# Patient Record
Sex: Male | Born: 1980
Health system: Southern US, Community
[De-identification: ages and names within clinical notes are randomized; demographics above are authoritative.]

## PROBLEM LIST (undated history)

## (undated) DIAGNOSIS — E119 Type 2 diabetes mellitus without complications: Secondary | ICD-10-CM

## (undated) DIAGNOSIS — D571 Sickle-cell disease without crisis: Secondary | ICD-10-CM

## (undated) HISTORY — PX: CHOLECYSTECTOMY, LAPAROSCOPIC: SHX56

---

## 2006-11-06 ENCOUNTER — Inpatient Hospital Stay (HOSPITAL_COMMUNITY): Admission: EM | Admit: 2006-11-06 | Discharge: 2006-11-11 | Payer: Self-pay | Admitting: Emergency Medicine

## 2006-11-06 ENCOUNTER — Ambulatory Visit: Payer: Self-pay | Admitting: Cardiology

## 2006-11-06 ENCOUNTER — Ambulatory Visit: Payer: Self-pay | Admitting: Internal Medicine

## 2006-11-06 IMAGING — CT CT ANGIO CHEST
2 of 5 series · 18 of 36 positions shown · IV contrast (APPLIED)
Comparison: None.

CLINICAL DATA: Chest pain. Shortness of breath. Elevated d-dimer. Leukocytosis.
History of sickle cell disease.

CT ANGIOGRAPHY OF CHEST ( PULMONARY EMBOLISM PROTOCOL)  [DATE]:
TECHNIQUE: Multidetector CT imaging of the chest was performed according to the
protocol for detection of pulmonary embolism during bolus injection of
intravenous contrast.  Coronal and sagittal plane CT angiographic image
reconstructions were also generated.
Contrast:  100 cc Omnipaque 300

[Series 5: pe 1.0 b40f thins for pacs · axial · 0.67mm/px · z∈[+1118,+1362]mm · 15 of 280 slices shown]
[im 18/280  lung]
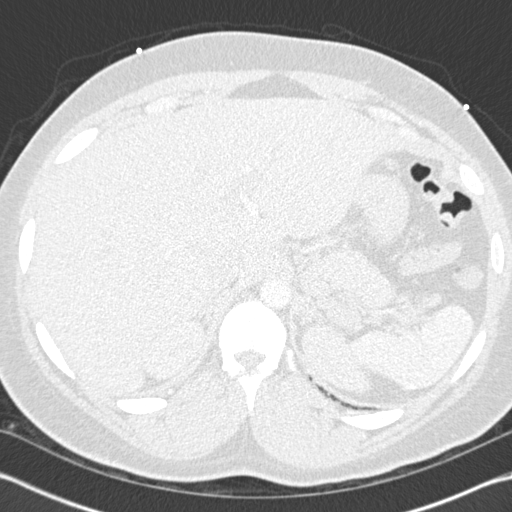
[im 35/280  mediastinal]
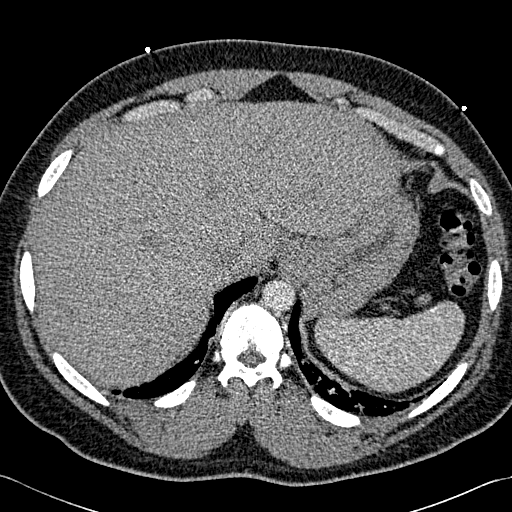
[im 53/280  lung]
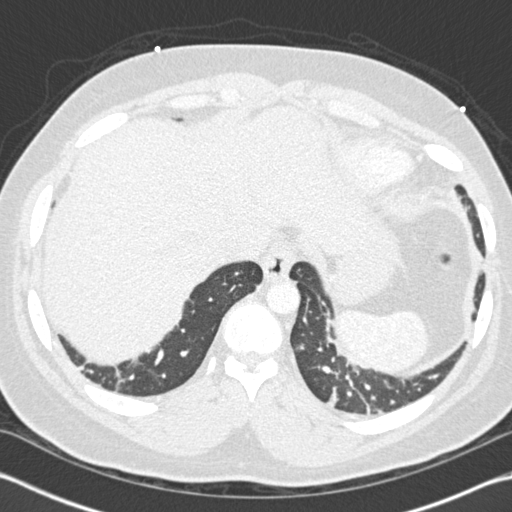
[im 70/280  mediastinal]
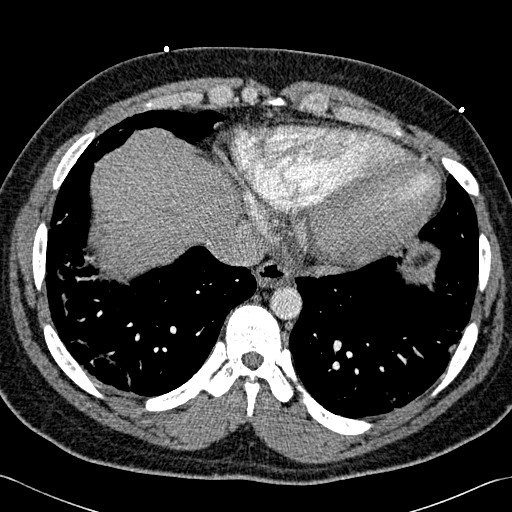
[im 88/280  lung]
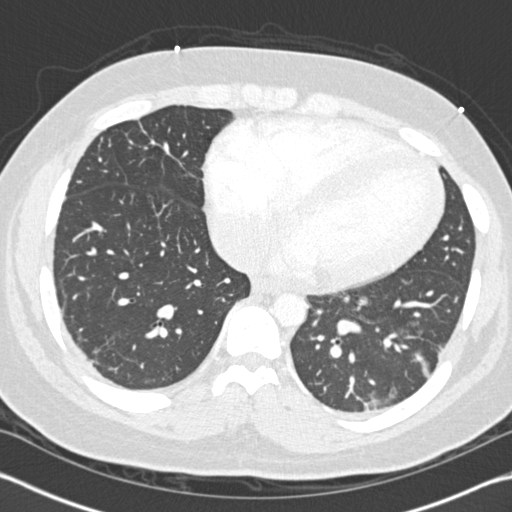
[im 105/280  mediastinal]
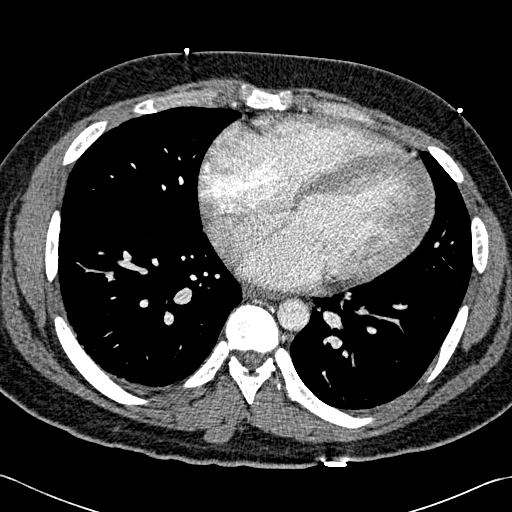
[im 123/280  lung]
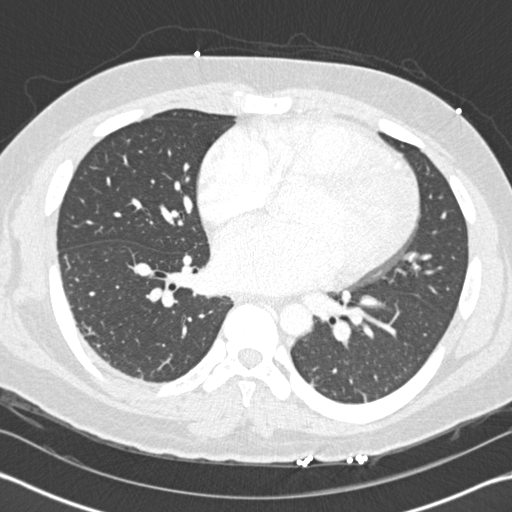
[im 140/280  mediastinal]
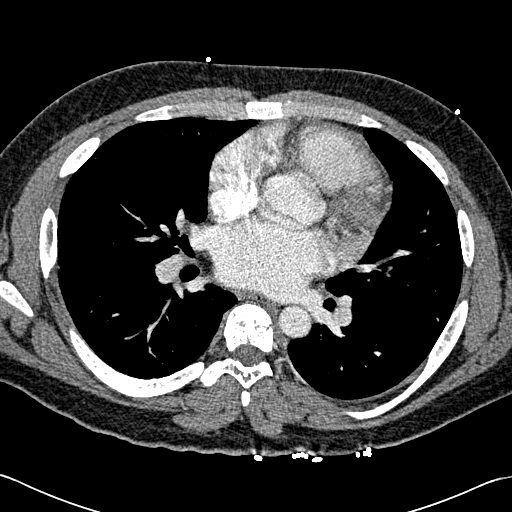
[im 157/280  lung]
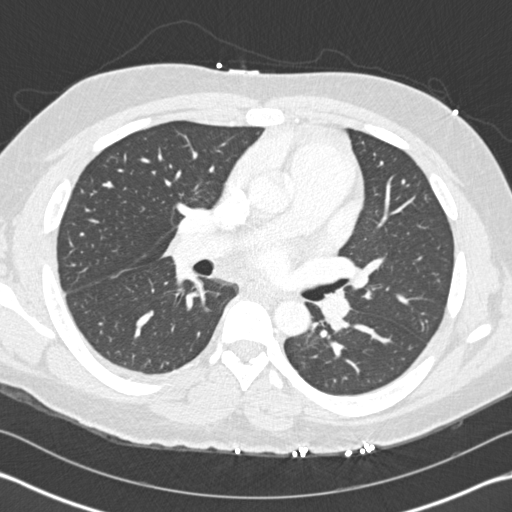
[im 175/280  mediastinal]
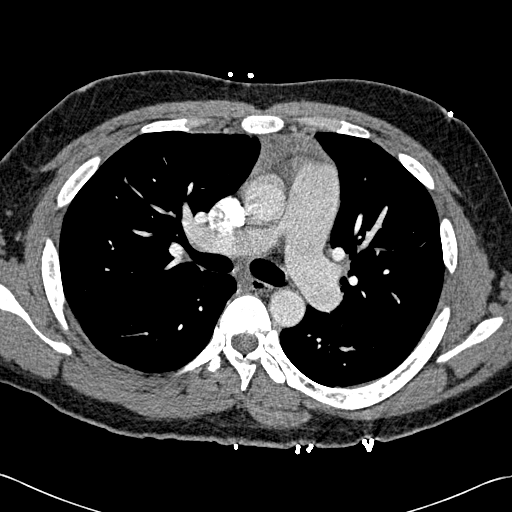
[im 192/280  lung]
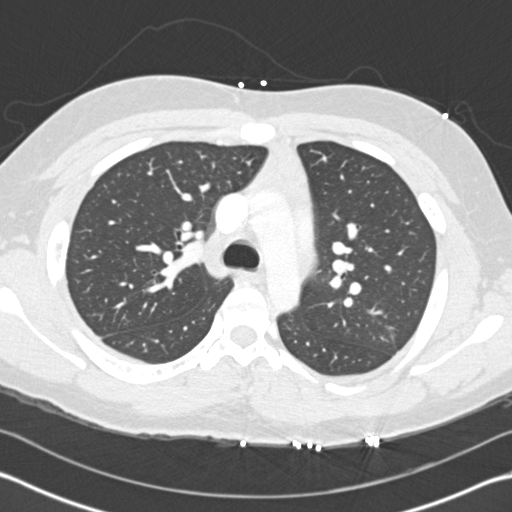
[im 210/280  mediastinal]
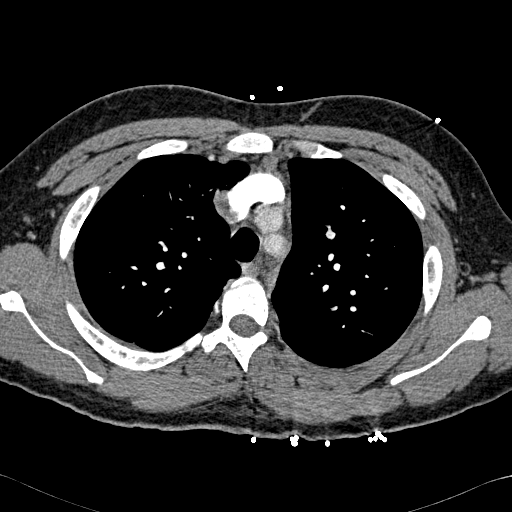
[im 227/280  lung]
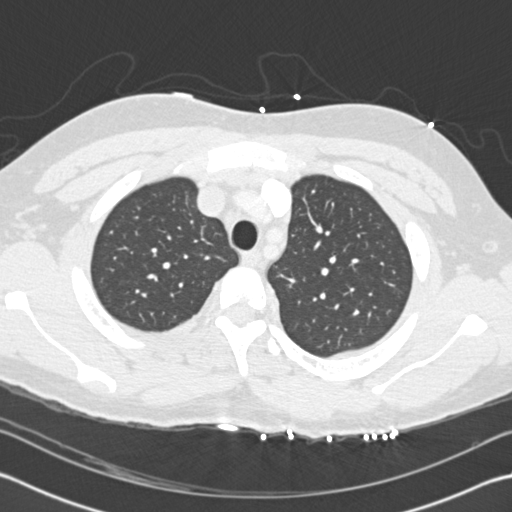
[im 245/280  mediastinal]
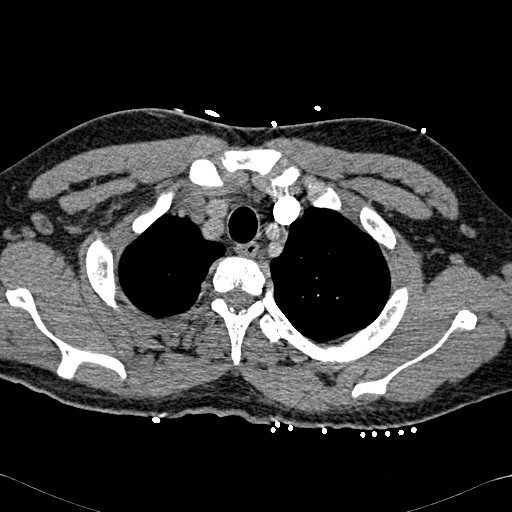
[im 262/280  lung]
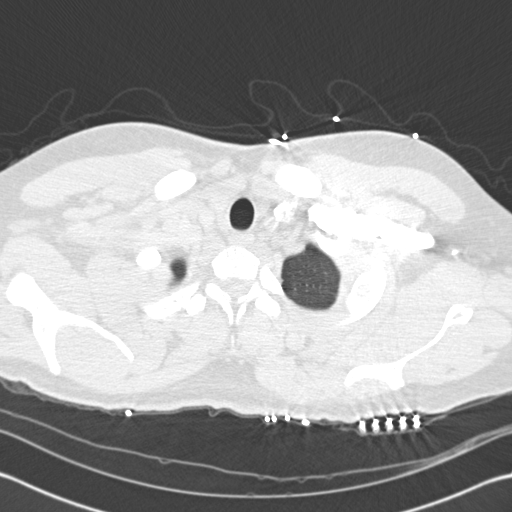

[Series 602: <mpr range> · coronal · 0.67mm/px · 3 of 69 slices shown]
[im 14/69  mediastinal]
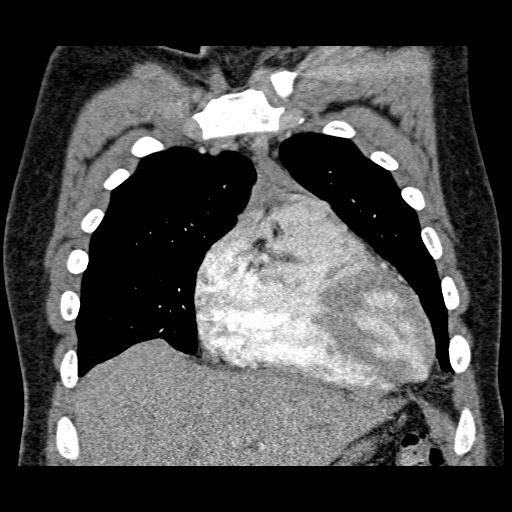
[im 28/69  mediastinal]
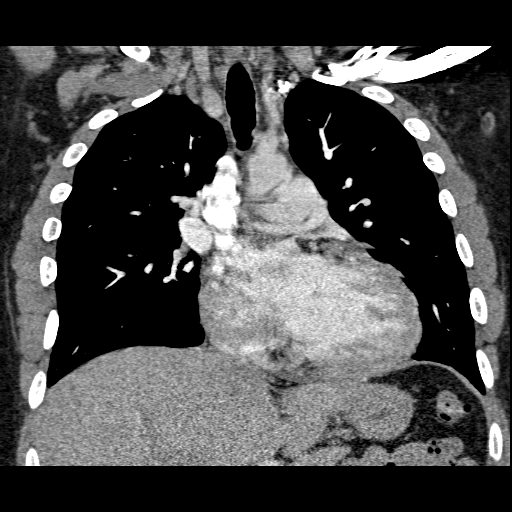
[im 41/69  mediastinal]
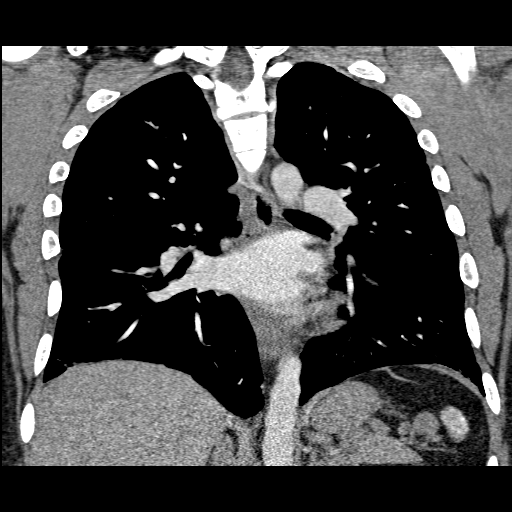

[18 of 36 positions shown; findings below may reference images not displayed]

FINDINGS: Contrast opacification of the pulmonary arteries is only fair,
yielding a study of moderate diagnostic quality. No filling defect either main
pulmonary artery or their central branches in either lung to suggest pulmonary
embolism. Heart enlarged with left ventricular predominance. No visible coronary
artery calcification. Thoracic and upper abdominal aorta normal in appearance.
No pericardial effusion. Residual thymic tissue in the anterior/superior
mediastinum. No significant lymphadenopathy.

Linear scar and/or atelectasis in the lower lobes and right middle lobe. Lungs
otherwise clear. No localized airspace consolidation. No pleural effusions. No
new interstitial disease to suggest edema.

Images of the upper abdomen demonstrating lymphadenopathy in the region of the
splenic hilum and adjacent to the tail of the pancreas. Small hiatal hernia.
Remaining visualized upper abdomen unremarkable.
IMPRESSION: 1. No evidence of central pulmonary embolism.
2. Cardiomegaly. Linear atelectasis and/or scarring in the lower lobes and right
middle lobe. No acute cardiopulmonary disease otherwise. The opacity at the
right base on the the chest x-ray earlier is in fact more consistent with
atelectasis than pneumonia.
3. Lymphadenopathy in the region of the splenic hilum and adjacent to the
pancreatic tail of unclear etiology. 

CT of the abdomen and pelvis may be helpful at some point for further evaluation
to see if there is lymphadenopathy elsewhere. With a history of sickle cell
disease, I would suggest waiting at least one day before giving the patient
another intravenous contrast load in order to minimize the chance of renal
damage.

## 2006-11-06 IMAGING — CR DG CHEST 2V
2 series · 2 of 2 positions shown · non-contrast
Comparison: None.

CLINICAL DATA: Chest pain. Shortness of breath. History of sickle cell disease.

CHEST - 2 VIEW  [DATE]:

[w chest lat]
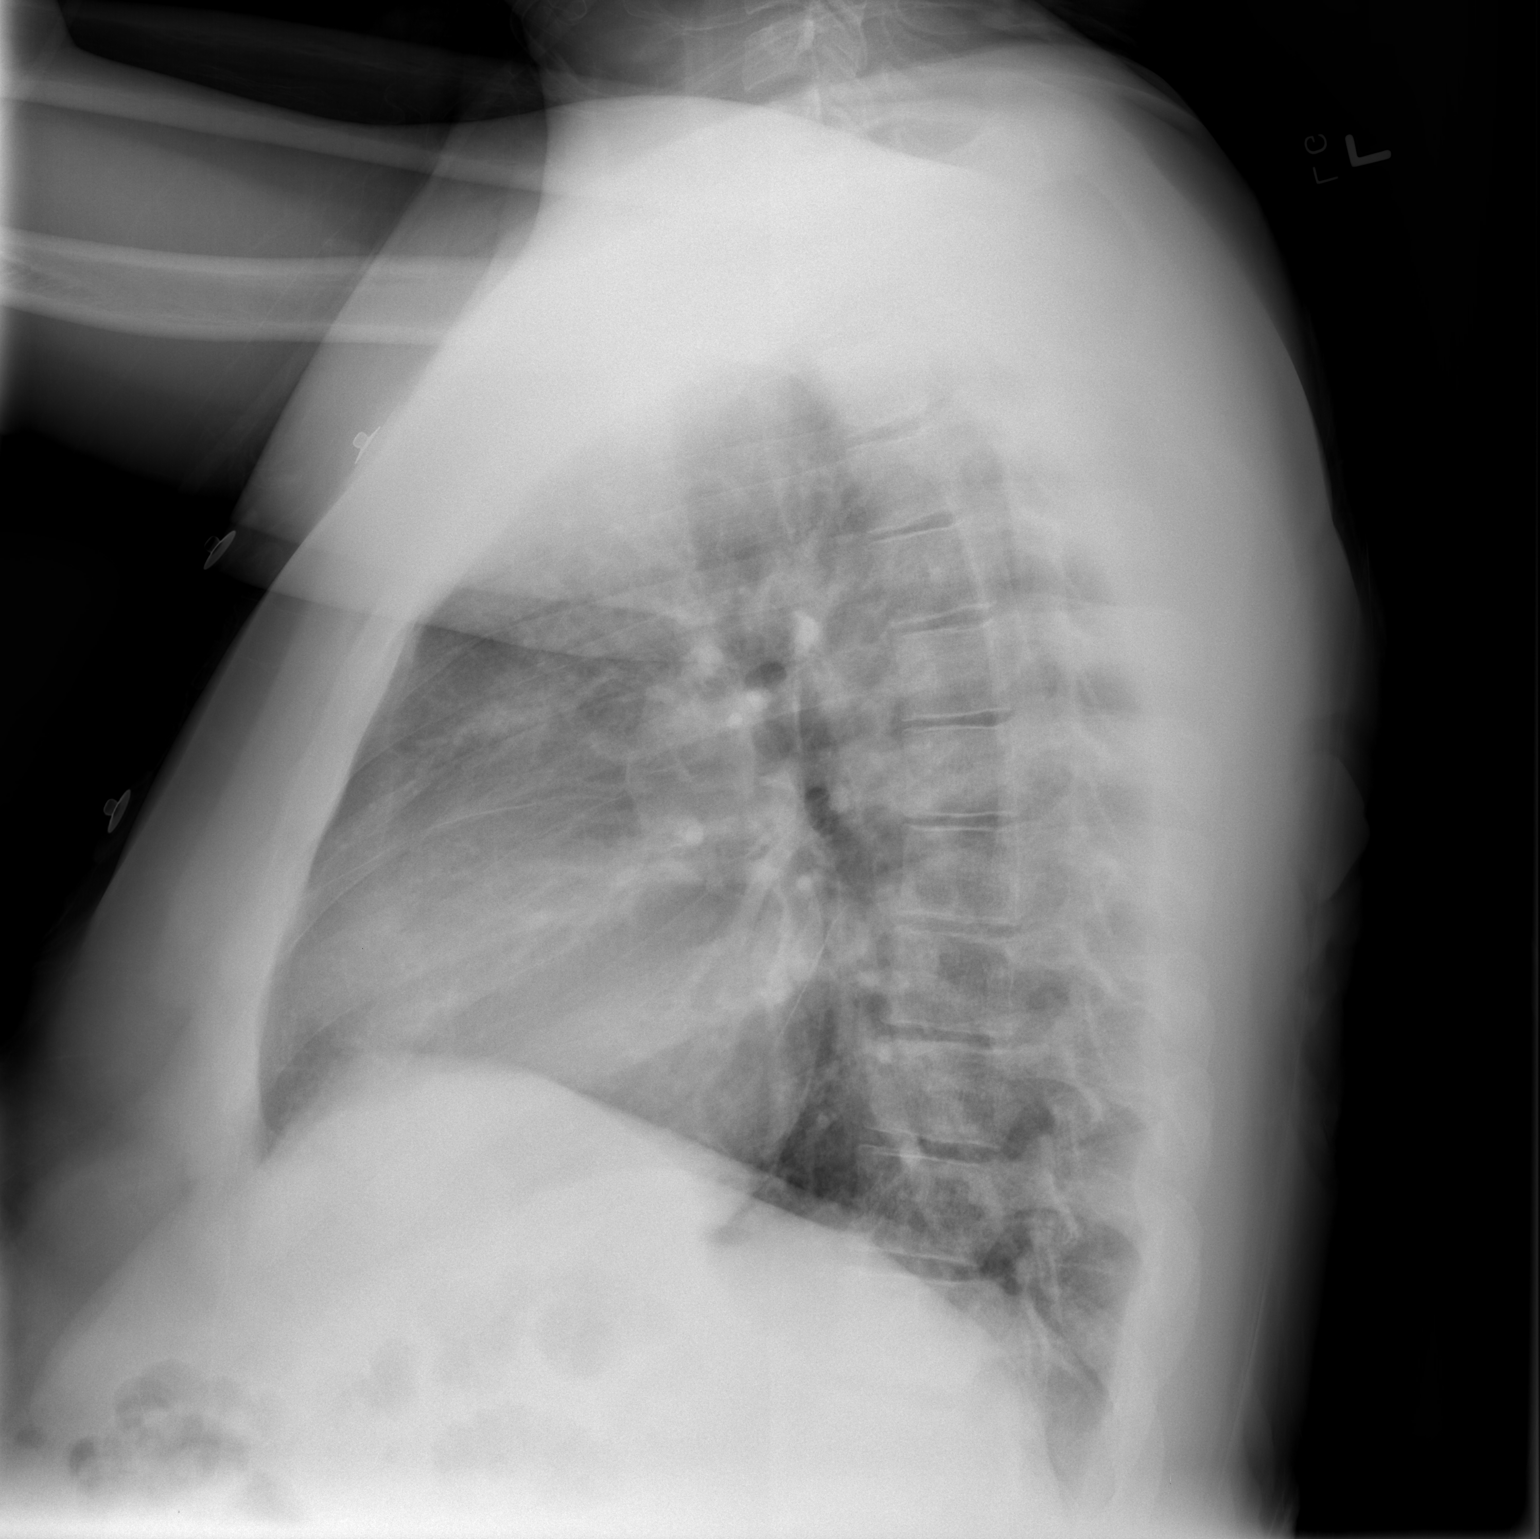

[view not recorded]
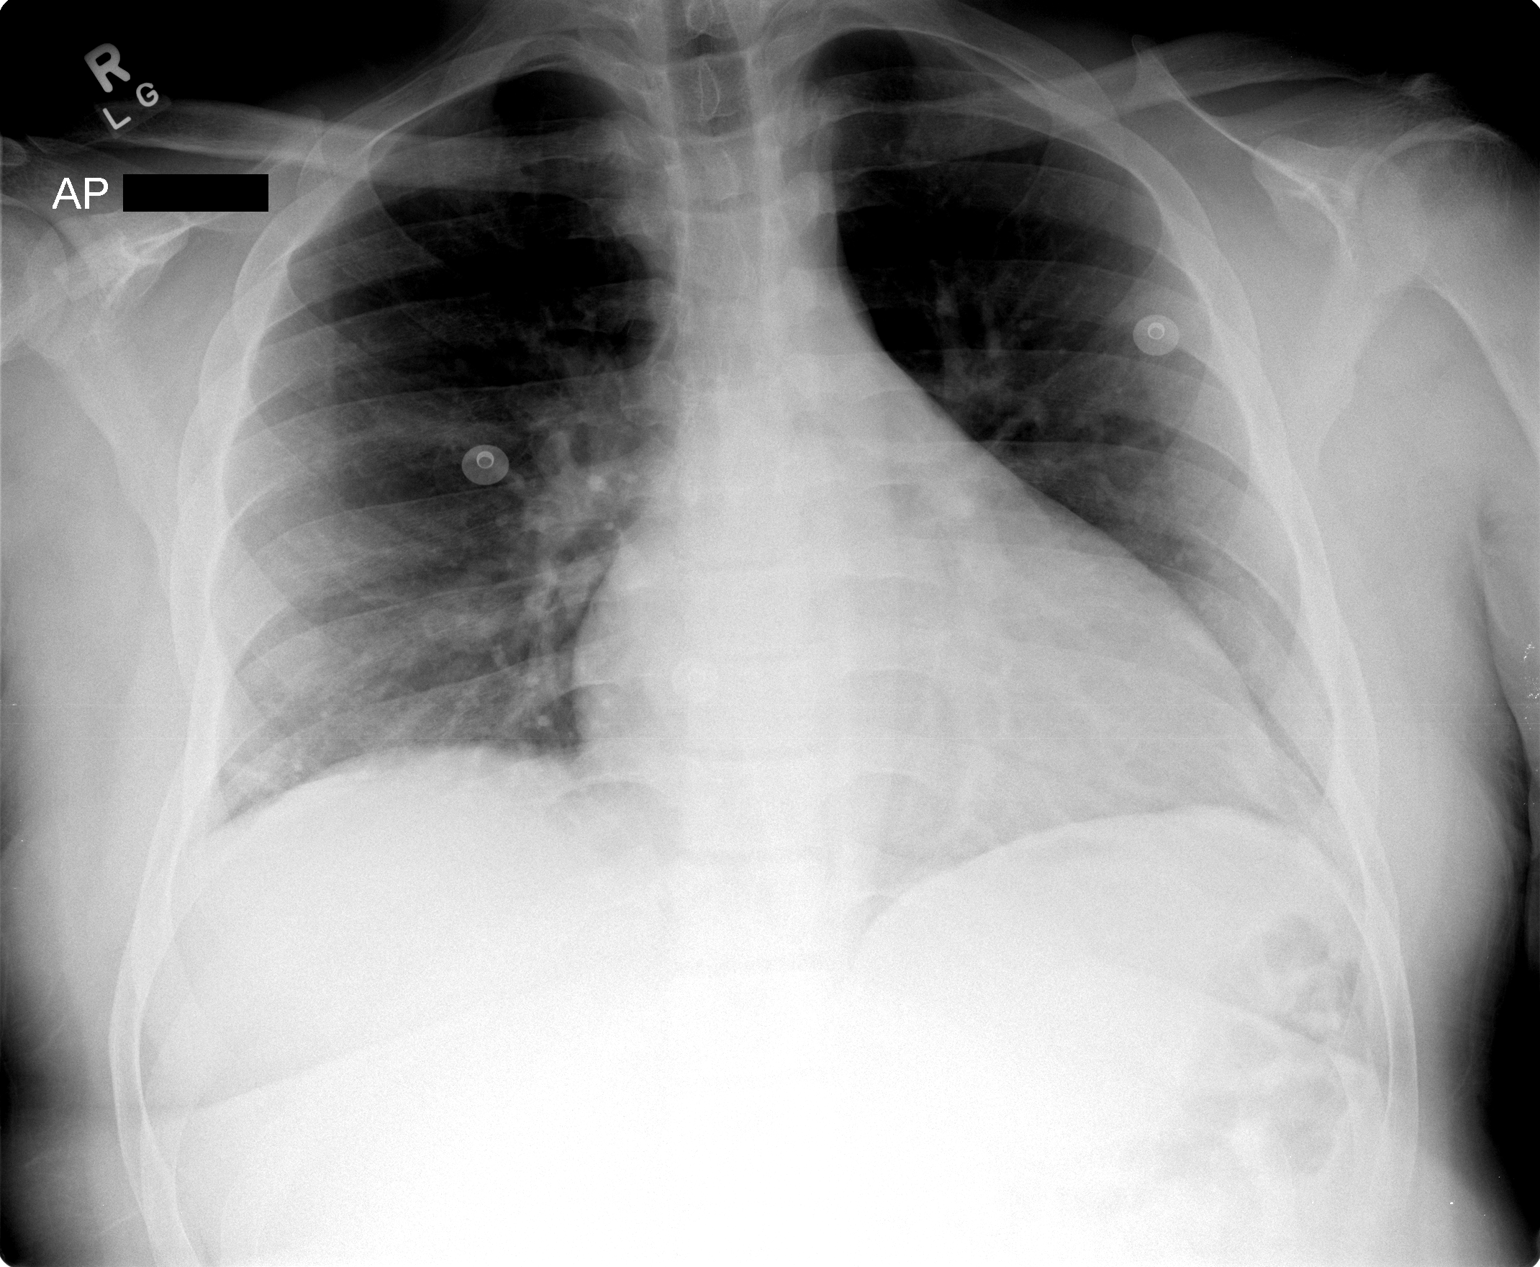

[2 of 2 positions shown; findings below may reference images not displayed]

FINDINGS: Heart moderately enlarged. Hilar and mediastinal contours otherwise
unremarkable. Minimal patchy opacity anterolaterally in the right lower lobe.
Lungs otherwise clear. No pleural effusions. Visualized bony thorax intact.
IMPRESSION: Moderate cardiomegaly. Focal patchy pneumonia anterolaterally in the right lower
lobe.

## 2006-11-09 IMAGING — CT CT PELVIS W/ CM
2 of 5 series · 17 of 46 positions shown, 19 images · IV contrast (APPLIED)
Comparison: none

CLINICAL DATA: Acute chest syndrome, sickle cell disease.

[Series 2: abd_pel 5.0 b40f st · axial · 0.75mm/px · z∈[-466,-72]mm · 14 of 89 slices shown, 16 images]
[im 5/89  soft-tissue]
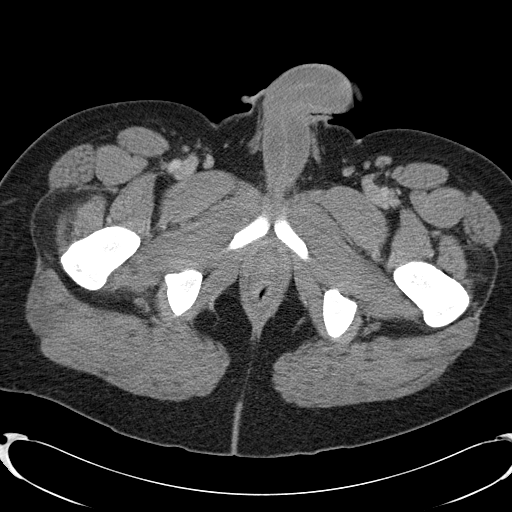
[im 5/89  bone]
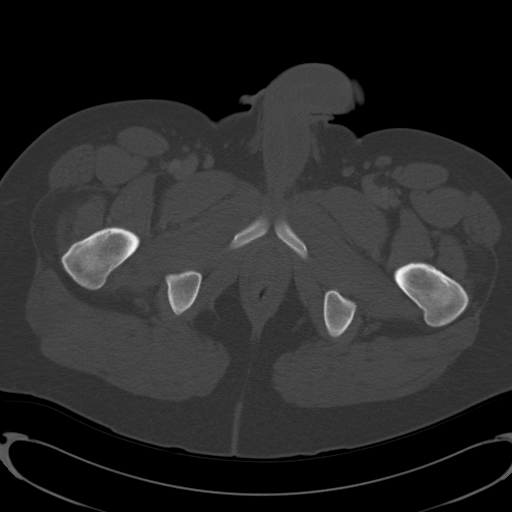
[im 14/89  soft-tissue]
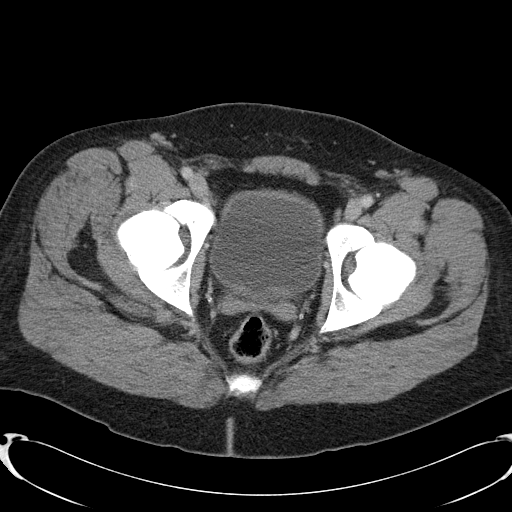
[im 18/89  soft-tissue]
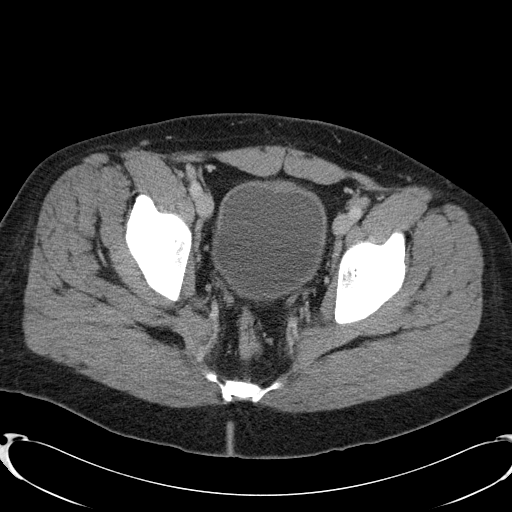
[im 23/89  soft-tissue]
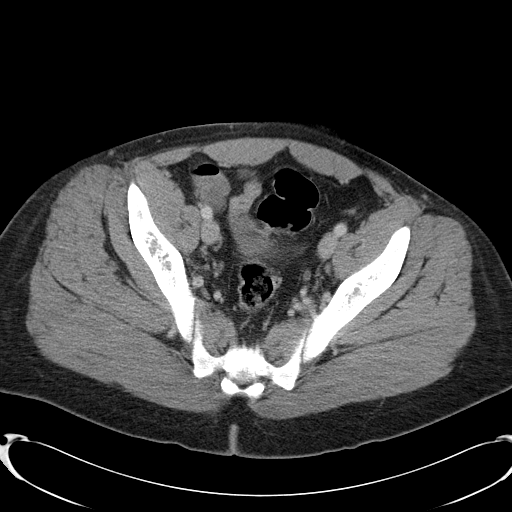
[im 31/89  soft-tissue]
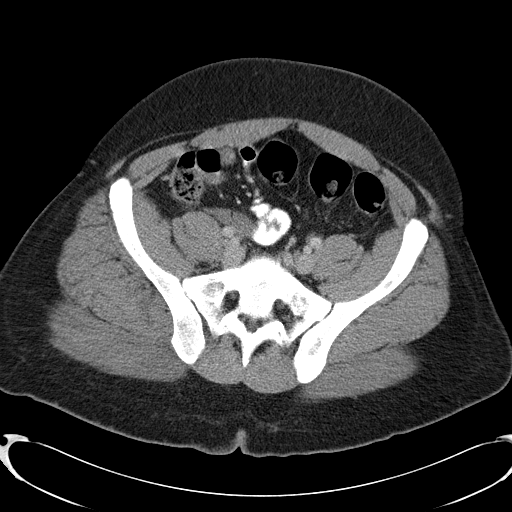
[im 36/89  soft-tissue]
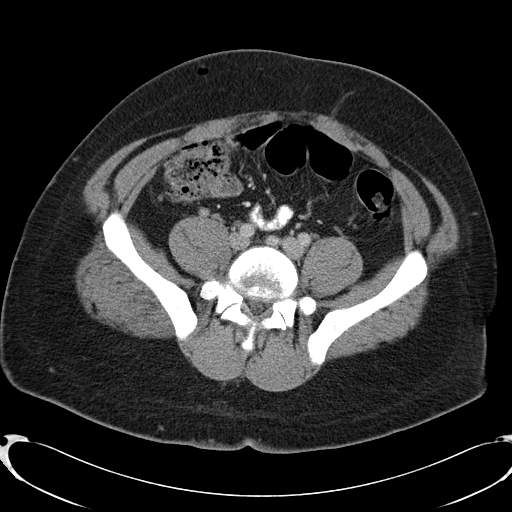
[im 40/89  soft-tissue]
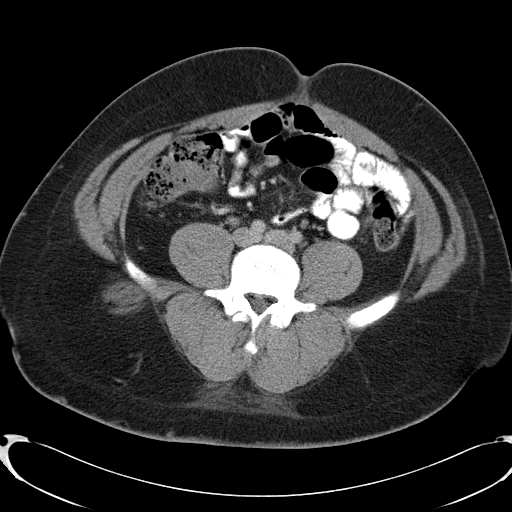
[im 49/89  soft-tissue]
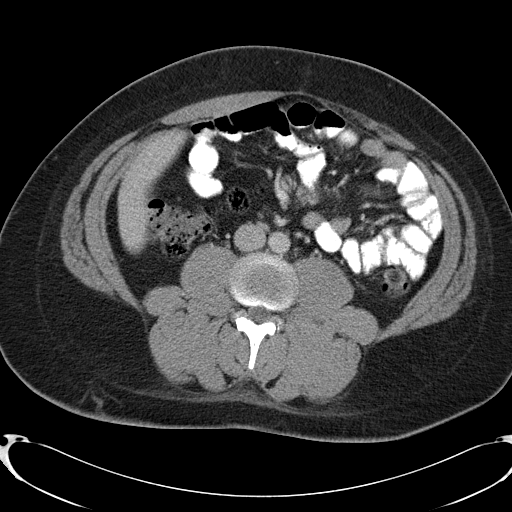
[im 53/89  soft-tissue]
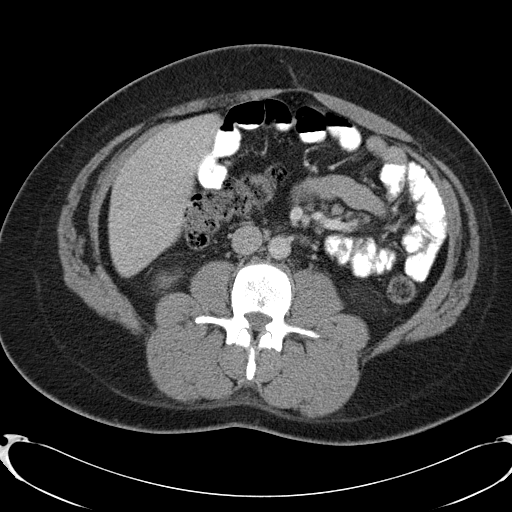
[im 53/89  bone]
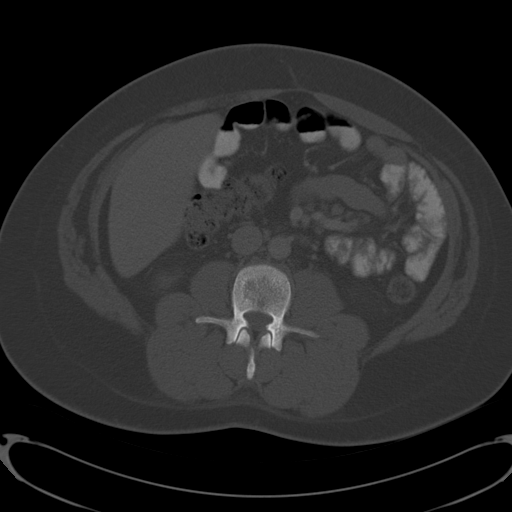
[im 58/89  soft-tissue]
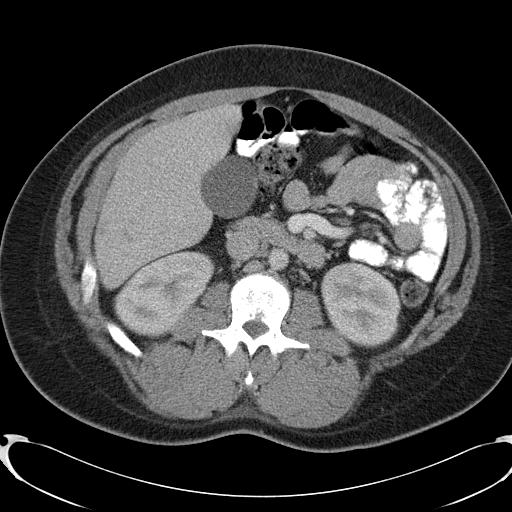
[im 67/89  soft-tissue]
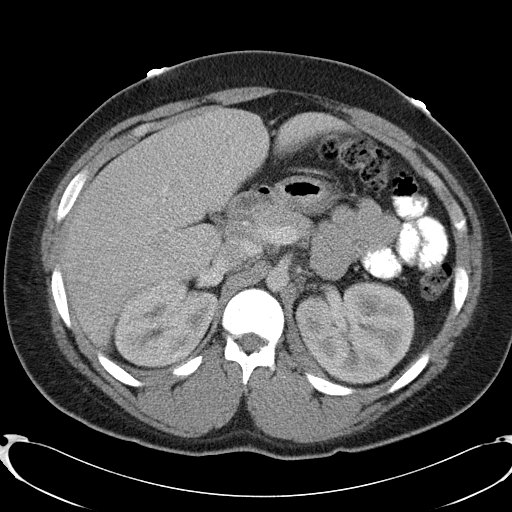
[im 71/89  soft-tissue]
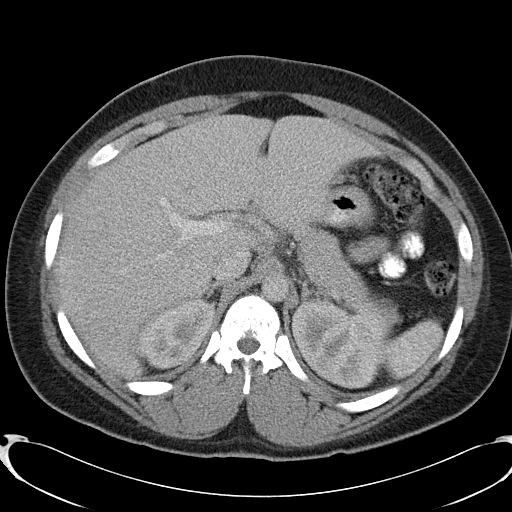
[im 75/89  soft-tissue]
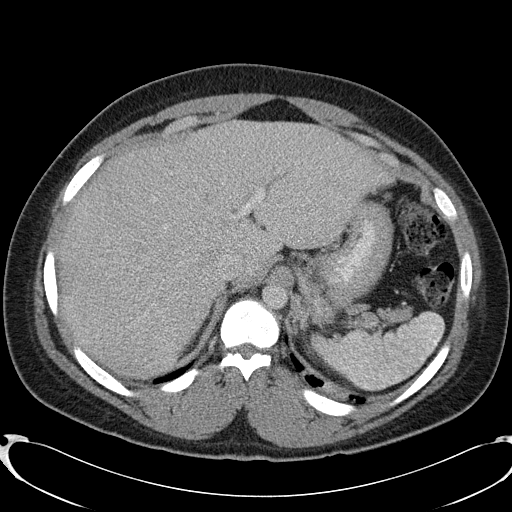
[im 84/89  soft-tissue]
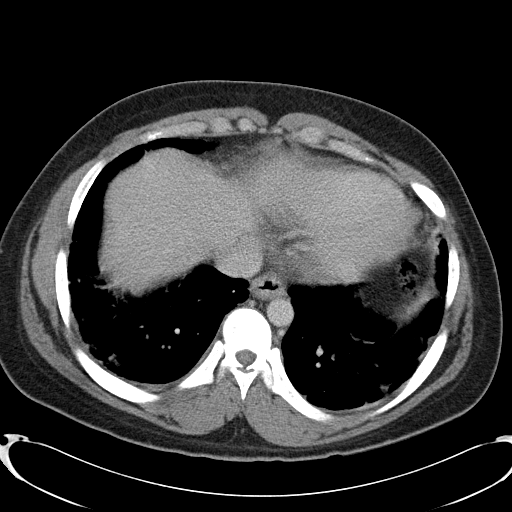

[Series 602: coronal abdomen · coronal · 0.90mm/px · 3 of 137 slices shown]
[im 46/137  soft-tissue]
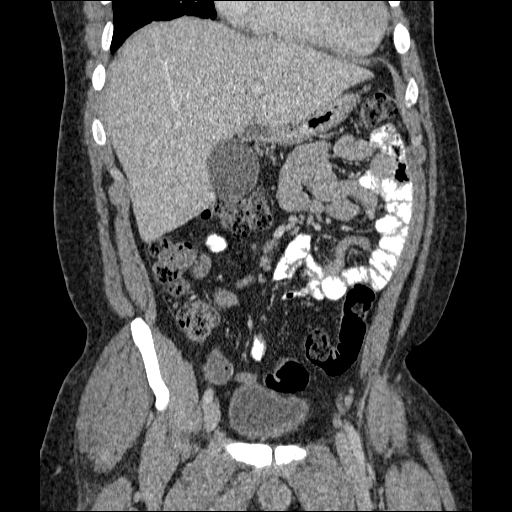
[im 61/137  soft-tissue]
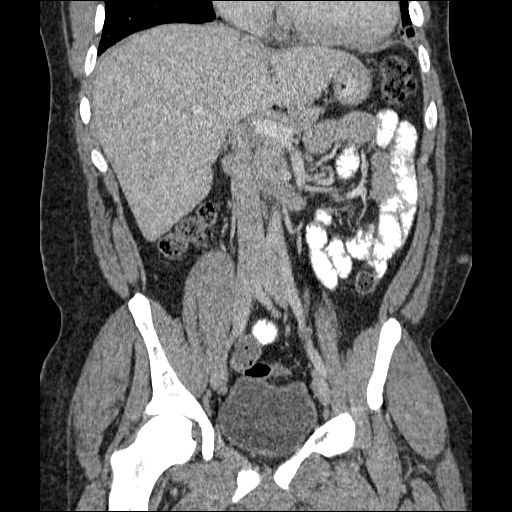
[im 76/137  soft-tissue]
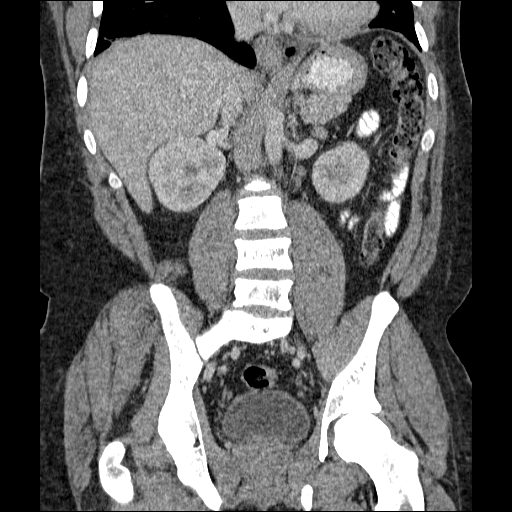

[17 of 46 positions shown; findings below may reference images not displayed]

CT abdomen with contrast:

Multidetector helical CT after 125  ml [TL] IV.
No previous for comparison. Patchy subsegmental atelectasis in the visualized
lung bases. Multiple mildly enlarged nodes are noted in the splenic hilum and
posterior pancreatic tail. Scattered subcentimeter lymph nodes noted centrally
in the mesentery. Normal appearing subcentimeter left periaortic and  
aortocaval nodes.
Unremarkable liver, gallbladder, spleen, adrenal glands, kidneys. Patent portal
vein, splenic vein, SMV. Small bowel is nondilated. No ascites. No free air.
Coarse sclerotic changes throughout the visualized thoracolumbar spine likely
related to patient's sickle cell disease.
IMPRESSION: 1. Isolated perisplenic and peripancreatic adenopathy, nonspecific.
2. No significant mesenteric or retroperitoneal adenopathy.
3. Vertebral body changes likely related to patient's sickle cell disease.

CT pelvis with contrast:

No significant pelvic adenopathy. The colon is nondilated, unremarkable. Urinary
bladder physiologically distended. No free fluid. Sclerotic changes in the
sacrum likely related to sickle cell disease.
IMPRESSION: 1. Negative. No pelvic adenopathy.

## 2006-11-11 ENCOUNTER — Encounter (INDEPENDENT_AMBULATORY_CARE_PROVIDER_SITE_OTHER): Payer: Self-pay | Admitting: Internal Medicine

## 2006-12-03 ENCOUNTER — Ambulatory Visit: Payer: Self-pay

## 2006-12-03 ENCOUNTER — Ambulatory Visit: Payer: Self-pay | Admitting: Internal Medicine

## 2009-01-14 DIAGNOSIS — L97509 Non-pressure chronic ulcer of other part of unspecified foot with unspecified severity: Secondary | ICD-10-CM

## 2009-01-14 DIAGNOSIS — R079 Chest pain, unspecified: Secondary | ICD-10-CM

## 2010-10-23 NOTE — H&P (Signed)
Michael Mullins, Michael Mullins                ACCOUNT NO.:  [id number]   MEDICAL RECORD NO.:  [id number]          PATIENT TYPE:  EMS   LOCATION:  ED                           FACILITY:  [facility]   PHYSICIAN:  Hettie Holstein, D.O.    DATE OF BIRTH:  [birth year]   DATE OF ADMISSION:  11/06/2006  DATE OF DISCHARGE:                              HISTORY & PHYSICAL   Primary care physician:  Unassigned.  He follows at [facility] with Dr.  Carita Pian at the sickle cell program there.   CHIEF COMPLAINT:  Acute-onset midsternal chest pain while working.   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  Around 2:49 this a.m. he experienced sudden onset of midsternal chest  discomfort and shortness of breath.  He had a cough as well.  He had  pain radiating to his back.  In the emergency department he underwent an  EKG that revealed normal sinus rhythm.  His chest radiograph was  suggestive of pneumonia.  He subsequently underwent CT scan that  revealed no evidence of central pulmonary embolus and some  lymphadenopathy and it was further suggested to pursue further  evaluation once at least a day out from his recent contrast study.  His  D-dimer, as above, was initially elevated; therefore, he underwent CT  per Dr. Read Drivers.  He typically follows at [facility] as described above, and  his last hospitalization was almost a year ago.  He said this had been  complicated by an ulcer that he developed in his foot and his  reticulocyte count in the emergency department is 17.4.   PAST MEDICAL HISTORY:  As noted above, right foot ulcer that he  experienced during his last painful crisis in June of last year.   MEDICATIONS:  He takes hydroxyurea on a daily basis but does not know  the dosage.  He takes folic acid and methadone daily, no dosage  provided, and OxyContin Immediate Release.   ALLERGIES:  He has listed MORPHINE, though he states he tolerates this  if given concomitantly with  Benadryl.   SOCIAL HISTORY:  He is a Designer, television/film set for [employer].  He denies tobacco or alcohol.  He is married with one child.   FAMILY HISTORY:  Mother died at age 62 with kidney failure as well as an  MI.  Father is alive in his 70s.  He does not know his family history.   REVIEW OF SYSTEMS:  He had been in his usual state of health.  No  fevers, chills, night sweats, chest pain, shortness of breath up until  acutely this morning at 2:49 a.m.  Otherwise, further review of systems  unremarkable.  No hematemesis, hematochezia or coffee-grounds emesis.   PHYSICAL EXAMINATION:  VITAL SIGNS:  In the emergency department his  temperature was 97.8, blood pressure 137/57, heart rate 97, respirations  20, O2 saturation 92% on room air.  HEENT:  Head normocephalic, atraumatic.  Extraocular muscles were  intact.  Oral mucosa was pink and moist.  He exhibited no jaundice.  CARDIOVASCULAR:  Normal S1, S2.  LUNGS:  Clear though diminished at the bases bilaterally.  ABDOMEN:  Soft.  No rebound or guarding.  EXTREMITIES:  No edema.  NEUROLOGIC:  No focal neurologic deficit.  He was euthymic.  His affect  was flat.   ASSESSMENT:  1. Sickle cell pain crisis.  2. Leukocytosis.  3. Elevated bilirubin, felt to be related to vaso-occlusive crisis and      hemolysis.  4. Lymphadenopathy.  5. Elevated D-dimer without evidence of central pulmonary embolus on      CT scan.   PLAN:  At this time Mr. Polanco will be admitted and generously hydrated,  followed on telemetry for the next 24 hours.  He will be admitted with  acute chest syndrome and administered antiemetics and supplemental  oxygen, and we will follow his clinical course, follow up his LFTs in  the morning as well, as well CBC and exchange-transfuse if he is not  improving with fluids and conservative measures.      Hettie Holstein, D.O.  Electronically Signed     ESS/MEDQ  D:  11/06/2006  T:  11/06/2006  Job:  [id number]

## 2010-10-23 NOTE — Discharge Summary (Signed)
Michael Mullins, Michael Mullins                ACCOUNT NO.:  [id number]   MEDICAL RECORD NO.:  [id number]          PATIENT TYPE:  INP   LOCATION:  1427                         FACILITY:  [facility]   PHYSICIAN:  Hind I Elsaid, MD      DATE OF BIRTH:  [birth year]   DATE OF ADMISSION:  11/06/2006  DATE OF DISCHARGE:                               DISCHARGE SUMMARY   DISCHARGE DIAGNOSES:  1. Sickle cell painful crisis.  2. Community acquired pneumonia.  3. Episode of ventricular tachycardia that lasted about 12 beats of      ventricular tachycardia on the telemetry monitor.  4. Diastolic heart dysfunction.   DISCHARGE MEDICATIONS:  1. Oxycodone 5 mg p.o. b.i.d. q.6 hours.  2. Methadone 5 mg p.o. b.i.d.  3. Protonix 40 mg p.o. daily.  4. Avelox 400 mg daily for 10 days.  5. Hydroxyurea as per the hematologist.   CONSULTATIONS:  Cardiology consulted for the ventricular tachycardia.   CT angio was negative for pulmonary embolism, cardiomegaly, linear  atelectasis and/or scarring in the lower lobe, right middle lobe.  No  acute cardiopulmonary disease.  The opacity of the right base on the  chest x-ray is in fact more consistent with atelectasis than pneumonia,  lymphadenopathy in the region of the splenic hilum adjacent to the  pancreatic tail of unclear etiology.  CT abdomen and pelvis to evaluate  the lymphadenopathy which was non-significant pelvic adenopathy.   HISTORY AND PHYSICAL EXAMINATION:  He is a 30 year old male.  He  presented with mediastinal chest discomfort and shortness of breath.  He  underwent discomfort and shortness of breath.  He had a cough.  He had  pain radiating to his back.  He had an EKG which revealed normal sinus  rhythm.  Chest x-ray suggestive of pneumonia.  CT scan was done to rule  out pulmonary embolism, which was negative.  1. Sickle cell painful crisis.  The patient underwent hydration, IV      fluid and pain medication with good response to the above regimen.     Pain was under control within 2 days.  2. The patient had an episode on the monitor that looked like      ventricular tachycardia, around 4 to 5 seconds.  CK-MB and troponin      was negative.  EKG revealed normal sinus rhythm.  Cardiology was      consulted where they agreed with echo.  A 2-D echocardiogram was      done with the result showing a normal left ventricular ejection      fraction of 55-60.  No diagnostic evidence of left ventricular      region wall motion abnormality.  The left ventricular wall      thickness was at the upper limit of normal.  This feature was      consistent with pseudonormal left ventricular filling, but then was      concomitant abnormal relaxation, increased filling pressure, so the      patient could have diastolic dysfunction.  Left atrium was mildly  to moderately dilated.  The interatrial septum both from left to      right consistent with increased left atrial pressure.  In regard to      the above cardiac monitor, cardiology was consulted with the echo      report as above.  They recommended to follow up the patient as an      outpatient, and they schedule further testing as an outpatient.  3. Patchy pneumonia.  Community acquired pneumonia.  Will discharge      the patient on Avelox for 1 week.   DISPOSITION:  The patient to be discharged home today.  Follow with  [facility] for further evaluation of the ventricular tachycardia  and follow with his hematologist for further recommendation regarding  the hydroxyurea.      Hind Bosie Helper, MD  Electronically Signed     HIE/MEDQ  D:  11/11/2006  T:  11/11/2006  Job:  [id number]

## 2010-10-23 NOTE — Letter (Signed)
December 03, 2006    Michael Riffle, MD, [facility]  [address]  [address]  [place], [place] [address]   RE:  Michael, Mullins  MRN:  [id number]  /  DOB:  09/01/1980   Dear Michael Mullins:   We saw Michael Mullins today at your request because of apparent wide  complex beats in the hospital; unfortunately, we have no record of that.   He presented at that time with chest pain and was hospitalized.  Chest x-  ray suggested pneumonia and CT scan demonstrated no evidence of  pulmonary embolism.  An ultrasound was done which demonstrated normal  left ventricular function with some diastolic impairment.  There was  suggestion if anything of left atrial pressure overload.  The tricuspid  valve was reported as normal.  Tricuspid regurgitation was minimal, so  no estimation of pulmonary pressures was made.   His current major complaint is exertional dyspnea as well as exertional  lightheadedness.   He recalls an episode of syncope when he was playing football in high  school; this occurred while they were crabbing, which is all-4  activities on your hands and legs.  He got lightheaded.  He actually got  up and started walking to the sidelines, where he passed out.  He had  been significantly symptomatic for a couple of minutes, but does not  sound arrhythmic.   He has not had other problems with chest pain, but he does have  significant shortness of breath.  He says he can walk as far as he  wants, but climbing a flight of stairs an doing moderate exertion is  associated with these symptoms as noted previously.   He is followed by a hematologist at [facility], whose name I do not recall.  His medications through that program are methadone 5 b.i.d., hydroxyurea  and folic acid.   ALLERGIES:  He has no known drug allergies.   SOCIAL HISTORY:  He is working for [employer].  He is married.  He has 1  child who unfortunately also has sickle cell.   FAMILY HISTORY:  Noncontributory.   REVIEW OF SYSTEMS:  His  review of systems is also noncontributory.   On examination today, he is a young African American appearing his  stated age of 82.  His weight is 236.  His blood pressure is 126/60 and  his pulse is 61.  HEENT:  Exam demonstrated no icterus and no  xanthomata.  The neck veins were flat.  Carotids were brisk and full  bilaterally without bruits. BACK:  Without kyphosis or scoliosis.  LUNGS:  Clear.  Heart sounds were regular with a 2/6 murmur. ABDOMEN:  Soft with active bowel sounds.  Femoral pulses were 2+.  Distal pulses  were intact.  There is no clubbing, cyanosis or edema.  NEUROLOGICAL:  Exam was grossly normal.   We submitted him for a treadmill test and it was surprising in that  there was initial change in heart rate from 60-90 with standing; by 1  minute of exercise, it was 110-115; by 2 minutes of exercise, it was 125  and prior to 3 minutes of exercise, it was over 140 and the test was  terminated because of dyspnea.  His initial blood pressure fell; it went  from 125 to 105 and then regrouped at about 125 and then escalated to  135.   IMPRESSION:  1. Apparent wide complex rhythms in the hospital for which we have not  yet seen the record.  2. Sickle cell disease.  3. Normal left ventricular function with diastolic abnormalities      without evidence of right ventricular involvement and unclear      pulmonary status.  4. Severe exercise intolerance related to:      a.     Question of tachycardia.      b.     Pulmonary disease.   Michael Mullins, I am not quite sure what is going on with Michael Mullins sinus  tachycardia with exercise.  I unfortunately failed to measure an O2 with  exercise and will want to do this.  I also need to do some literature  work on trying to understand what the pathophysiology of the sickle cell  may be as it relates to this and it may be worth my calling the  hematologist over at [facility].  In fact, I will probably need to call the  patient to get his  name so I can do that.   I will forward these results on to you.  I have spoken to Dr. Nicholes Mullins about this gentleman as well.  We may need some further input  like right heart data, as we do not have things available to [place] from the  echo.   Thanks very much for asking [place] to see him.    Michael Mullins,      Michael Salvia, MD, [facility]  Electronically Signed    Michael Mullins  DD: 12/03/2006  DT: 12/04/2006  Job #: [id number]

## 2010-10-23 NOTE — Consult Note (Signed)
NAMEMINARD, MILLIRONS                ACCOUNT NO.:  [id number]   MEDICAL RECORD NO.:  [id number]          PATIENT TYPE:  INP   LOCATION:  1427                         FACILITY:  [facility]   PHYSICIAN:  Pricilla Riffle, MD, FACCDATE OF BIRTH:  March 31, 1981   DATE OF CONSULTATION:  11/10/2006  DATE OF DISCHARGE:                                 CONSULTATION   IDENTIFICATION:  We are asked to see the patient who is a 30 year old  with sickle cell disease for evaluation of wide complex tachycardia.   HISTORY OF PRESENT ILLNESS:  The patient was admitted on May 29 with a 2-  day history of chest pain.  He said it came on suddenly had some  coughing, the pain was very sharp substernal lasted 2 days without  interruption.  Chest x-ray questioned pneumonia.  CT was negative of the  chest for a central PE.  Note also it commented that there was no  coronary calcification. There was some lymphadenopathy noted.  Abdominal  CT was done showing perisplenic and peripancreatic lymphadenopathy.   The patient was treated with pain medicines and the pain went away.   Note he did complain of back pain.  This was not like his prior sickle  cell crises.   The patient denies history of palpitations, remote syncope in [employer]. He is active, cuts along without problem other than some  shortness of breath. He is starting a new job with [employer].   ALLERGIES:  Question MORPHINE.   PAST MEDICAL HISTORY:  1. Sickle cell anemia.  2. History of a lower extremity foot ulcer 1 year ago with crisis.  3. Remote syncope in [employer], played football.   SOCIAL HISTORY:  The patient is married with one child. Does not smoke,  does not drink. 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  No change.   FAMILY HISTORY:  Mother with diabetes, hypertension, renal failure on  dialysis had an MI died at 75.  Father is alive. Maternal side family  significant for CAD.   MEDICATIONS AT HOME:  Include hydroxyurea ,folic acid, methadone.   CURRENT MEDICATIONS:  1. Protonix 40.  2. Senokot q.h.s.  3. Methadone 5 b.i.d.  4. Folic acid daily.  5. Lovenox daily.  6. Avelox q.24 400.  7. Zofran p.r.n.  8. The patient given IV Lasix x1 yesterday 40 mg.   REVIEW OF SYSTEMS:  Again remote syncope in [employer].   The patient notes nausea here some dizziness.  Physical otherwise all  systems reviewed negative to the above problem except as noted.   PHYSICAL EXAM:  GENERAL:  Exam the patient currently notes some nausea,  no chest pain.  VITAL SIGNS:  Temperature is a 97.4, blood pressure 107-123 over 70s.  Pulse is anywhere from the 50s to 60s, I&O's -1410 yesterday.  HEENT:  Normocephalic, atraumatic.  EOMI.  PERRL.  NECK:  JVP is normal.  No thyromegaly.  No bruits.  Throat clear.  Nares  clear.  LUNGS:  Clear to auscultation.  No rales or wheezes.  HEART:  Regular rate and rhythm S1-S2.  No S3.  ABDOMEN:  No murmurs.  Supple, nontender.  No masses.  No hepatomegaly.  EXTREMITIES:  Good distal pulses throughout.  No lower extremity edema.   12-lead EKG on 05/31 shows normal sinus rhythm, 95 beats per minute.  QTC of 0.46 milliseconds.  The telemetry; one episode of SVT, six beats  05/31, wide complex tachycardia yesterday 1:00 a.m. 12 beats, appears to  have some AV dissociation.   LABS:  Significant for hemoglobin of 9.6 with an MCV of 104, MCHC of 37,  WBC of 11.5.  TSH 2.2.  Potassium today 4.1.  BUN and creatinine of 5  and 0.7, troponin less than 0.05, MB negative. D-dimer on admission of  2.8.  CT abdomen; peripancreatic and perisplenic adenopathy. CT the  chest; no central PE, no coronary calcification, atelectasis.   IMPRESSION:  The patient is a 30 year old with sickle cell anemia  admitted with 2 days of chest pain sharp continue with troponin CK are  negative.  Treated with pain medicine and resolved. On antibiotics still  with nausea,  some dizziness.  Telemetry with 12 beats of wide complex  tachycardia that appears maybe ventricular with some AV dissociation.  I  agree with echocardiogram.   I do not think the patient was symptomatic from the episodes tachycardia  occurred at 1:00 a.m.  Note again chest CT shows no cardiac  calcification. If on echo EF is normal we will schedule outpatient  Holter and follow up. If EF is abnormal will need further testing.  Again no history of syncope.      Pricilla Riffle, MD, [facility]  Electronically Signed     PVR/MEDQ  D:  11/10/2006  T:  11/10/2006  Job:  [id number]

## 2011-03-28 LAB — BASIC METABOLIC PANEL
CO2: 27
Chloride: 105
GFR calc Af Amer: >60
Potassium: 4.1

## 2011-03-28 LAB — CBC
HCT: 27.9 — ABNORMAL LOW
Hemoglobin: 9.6 — ABNORMAL LOW
MCV: 104.3 — ABNORMAL HIGH
Platelets: 188
RBC: 2.68 — ABNORMAL LOW
RBC: 2.7 — ABNORMAL LOW
WBC: 10.7 — ABNORMAL HIGH
WBC: 11.5 — ABNORMAL HIGH

## 2020-10-25 ENCOUNTER — Other Ambulatory Visit (HOSPITAL_BASED_OUTPATIENT_CLINIC_OR_DEPARTMENT_OTHER): Payer: Self-pay

## 2020-10-25 DIAGNOSIS — Z5181 Encounter for therapeutic drug level monitoring: Secondary | ICD-10-CM | POA: Diagnosis not present

## 2020-10-25 DIAGNOSIS — L662 Folliculitis decalvans: Secondary | ICD-10-CM | POA: Diagnosis not present

## 2020-10-25 MED ORDER — ISOTRETINOIN 40 MG PO CAPS
40.0000 mg | ORAL_CAPSULE | Freq: Every day | ORAL | 0 refills | Status: DC
  Filled 2020-10-25: qty 30, 30d supply, fill #0

## 2020-10-26 ENCOUNTER — Other Ambulatory Visit (HOSPITAL_BASED_OUTPATIENT_CLINIC_OR_DEPARTMENT_OTHER): Payer: Self-pay

## 2020-10-27 ENCOUNTER — Other Ambulatory Visit (HOSPITAL_BASED_OUTPATIENT_CLINIC_OR_DEPARTMENT_OTHER): Payer: Self-pay

## 2020-10-30 ENCOUNTER — Other Ambulatory Visit (HOSPITAL_BASED_OUTPATIENT_CLINIC_OR_DEPARTMENT_OTHER): Payer: Self-pay

## 2020-10-31 ENCOUNTER — Other Ambulatory Visit (HOSPITAL_BASED_OUTPATIENT_CLINIC_OR_DEPARTMENT_OTHER): Payer: Self-pay

## 2020-10-31 MED ORDER — OXYCODONE HCL 30 MG PO TABS
30.0000 mg | ORAL_TABLET | Freq: Four times a day (QID) | ORAL | 0 refills | Status: DC | PRN
  Filled 2020-10-31: qty 120, 30d supply, fill #0

## 2020-10-31 MED ORDER — NALOXONE HCL 4 MG/0.1ML NA LIQD
NASAL | 0 refills | Status: DC
Start: 2020-08-07 — End: 2023-11-23
  Filled 2020-10-31: qty 2, 30d supply, fill #0

## 2020-10-31 MED ORDER — FERROUS SULFATE 325 (65 FE) MG PO TABS
ORAL_TABLET | ORAL | 0 refills | Status: DC
  Filled 2020-10-31: qty 15, 30d supply, fill #0

## 2020-10-31 MED ORDER — ERGOCALCIFEROL 1.25 MG (50000 UT) PO CAPS
ORAL_CAPSULE | ORAL | 0 refills | Status: DC
  Filled 2020-10-31: qty 3, 90d supply, fill #0

## 2020-10-31 MED ORDER — METFORMIN HCL ER 500 MG PO TB24
500.0000 mg | ORAL_TABLET | ORAL | 0 refills | Status: DC
  Filled 2020-10-31: qty 180, 90d supply, fill #0

## 2020-10-31 MED ORDER — METHADONE HCL 10 MG PO TABS
ORAL_TABLET | ORAL | 0 refills | Status: DC
  Filled 2020-10-31: qty 270, 30d supply, fill #0

## 2020-10-31 MED ORDER — FOLIC ACID 1 MG PO TABS
ORAL_TABLET | ORAL | 0 refills | Status: DC
  Filled 2020-10-31: qty 30, 30d supply, fill #0
  Filled 2020-11-29: qty 90, 90d supply, fill #1
  Filled 2021-05-29: qty 90, 90d supply, fill #2

## 2020-10-31 MED ORDER — METFORMIN HCL 500 MG PO TABS
ORAL_TABLET | ORAL | 0 refills | Status: DC
  Filled 2020-10-31: qty 360, 90d supply, fill #0

## 2020-11-01 ENCOUNTER — Other Ambulatory Visit (HOSPITAL_BASED_OUTPATIENT_CLINIC_OR_DEPARTMENT_OTHER): Payer: Self-pay

## 2020-11-02 ENCOUNTER — Other Ambulatory Visit (HOSPITAL_BASED_OUTPATIENT_CLINIC_OR_DEPARTMENT_OTHER): Payer: Self-pay

## 2020-11-03 ENCOUNTER — Other Ambulatory Visit (HOSPITAL_BASED_OUTPATIENT_CLINIC_OR_DEPARTMENT_OTHER): Payer: Self-pay

## 2020-11-03 DIAGNOSIS — U099 Post covid-19 condition, unspecified: Secondary | ICD-10-CM | POA: Diagnosis not present

## 2020-11-03 DIAGNOSIS — D5780 Other sickle-cell disorders without crisis: Secondary | ICD-10-CM | POA: Diagnosis not present

## 2020-11-03 DIAGNOSIS — E119 Type 2 diabetes mellitus without complications: Secondary | ICD-10-CM | POA: Diagnosis not present

## 2020-11-03 DIAGNOSIS — R519 Headache, unspecified: Secondary | ICD-10-CM | POA: Diagnosis not present

## 2020-11-03 DIAGNOSIS — Z7984 Long term (current) use of oral hypoglycemic drugs: Secondary | ICD-10-CM | POA: Diagnosis not present

## 2020-11-03 MED ORDER — JARDIANCE 10 MG PO TABS
10.0000 mg | ORAL_TABLET | Freq: Every day | ORAL | 3 refills | Status: DC
  Filled 2020-11-03: qty 90, 90d supply, fill #0
  Filled 2021-03-01: qty 90, 90d supply, fill #1
  Filled 2021-05-29: qty 90, 90d supply, fill #2

## 2020-11-03 MED ORDER — LISINOPRIL 2.5 MG PO TABS
2.5000 mg | ORAL_TABLET | Freq: Every day | ORAL | 3 refills | Status: DC
  Filled 2020-11-03: qty 90, 90d supply, fill #0
  Filled 2021-03-01: qty 90, 90d supply, fill #1
  Filled 2021-05-29: qty 90, 90d supply, fill #2

## 2020-11-14 ENCOUNTER — Other Ambulatory Visit (HOSPITAL_BASED_OUTPATIENT_CLINIC_OR_DEPARTMENT_OTHER): Payer: Self-pay

## 2020-11-23 DIAGNOSIS — Q8901 Asplenia (congenital): Secondary | ICD-10-CM | POA: Diagnosis not present

## 2020-11-23 DIAGNOSIS — I878 Other specified disorders of veins: Secondary | ICD-10-CM | POA: Diagnosis not present

## 2020-11-23 DIAGNOSIS — D571 Sickle-cell disease without crisis: Secondary | ICD-10-CM | POA: Diagnosis not present

## 2020-11-23 DIAGNOSIS — E559 Vitamin D deficiency, unspecified: Secondary | ICD-10-CM | POA: Diagnosis not present

## 2020-11-23 DIAGNOSIS — G8929 Other chronic pain: Secondary | ICD-10-CM | POA: Diagnosis not present

## 2020-11-28 ENCOUNTER — Other Ambulatory Visit (HOSPITAL_BASED_OUTPATIENT_CLINIC_OR_DEPARTMENT_OTHER): Payer: Self-pay

## 2020-11-28 DIAGNOSIS — L308 Other specified dermatitis: Secondary | ICD-10-CM | POA: Diagnosis not present

## 2020-11-28 DIAGNOSIS — L700 Acne vulgaris: Secondary | ICD-10-CM | POA: Diagnosis not present

## 2020-11-28 DIAGNOSIS — L662 Folliculitis decalvans: Secondary | ICD-10-CM | POA: Diagnosis not present

## 2020-11-28 MED ORDER — ISOTRETINOIN 40 MG PO CAPS: ORAL_CAPSULE | ORAL | 0 refills | Status: DC

## 2020-11-28 MED ORDER — CLARAVIS 40 MG PO CAPS
ORAL_CAPSULE | ORAL | 0 refills | Status: DC
  Filled 2020-11-28: qty 30, 15d supply, fill #0

## 2020-11-28 MED ORDER — TRIAMCINOLONE ACETONIDE 0.1 % EX CREA
TOPICAL_CREAM | CUTANEOUS | 3 refills | Status: DC
  Filled 2020-11-28: qty 80, 14d supply, fill #0

## 2020-11-29 ENCOUNTER — Other Ambulatory Visit (HOSPITAL_BASED_OUTPATIENT_CLINIC_OR_DEPARTMENT_OTHER): Payer: Self-pay

## 2020-12-06 ENCOUNTER — Other Ambulatory Visit (HOSPITAL_BASED_OUTPATIENT_CLINIC_OR_DEPARTMENT_OTHER): Payer: Self-pay

## 2020-12-06 MED ORDER — METHADONE HCL 10 MG PO TABS
ORAL_TABLET | ORAL | 0 refills | Status: DC
  Filled 2020-12-06: qty 270, 30d supply, fill #0

## 2020-12-06 MED ORDER — OXYCODONE HCL 30 MG PO TABS
30.0000 mg | ORAL_TABLET | Freq: Four times a day (QID) | ORAL | 0 refills | Status: DC | PRN
  Filled 2020-12-06: qty 120, 30d supply, fill #0

## 2020-12-15 DIAGNOSIS — R519 Headache, unspecified: Secondary | ICD-10-CM | POA: Diagnosis not present

## 2020-12-15 DIAGNOSIS — Z7984 Long term (current) use of oral hypoglycemic drugs: Secondary | ICD-10-CM | POA: Diagnosis not present

## 2020-12-15 DIAGNOSIS — E119 Type 2 diabetes mellitus without complications: Secondary | ICD-10-CM | POA: Diagnosis not present

## 2020-12-15 DIAGNOSIS — R197 Diarrhea, unspecified: Secondary | ICD-10-CM | POA: Diagnosis not present

## 2020-12-15 DIAGNOSIS — R42 Dizziness and giddiness: Secondary | ICD-10-CM | POA: Diagnosis not present

## 2020-12-15 DIAGNOSIS — R110 Nausea: Secondary | ICD-10-CM | POA: Diagnosis not present

## 2020-12-15 DIAGNOSIS — R079 Chest pain, unspecified: Secondary | ICD-10-CM | POA: Diagnosis not present

## 2020-12-17 DIAGNOSIS — I517 Cardiomegaly: Secondary | ICD-10-CM | POA: Diagnosis not present

## 2020-12-18 DIAGNOSIS — R197 Diarrhea, unspecified: Secondary | ICD-10-CM | POA: Diagnosis not present

## 2020-12-27 ENCOUNTER — Other Ambulatory Visit (HOSPITAL_COMMUNITY): Payer: Self-pay

## 2020-12-29 DIAGNOSIS — L662 Folliculitis decalvans: Secondary | ICD-10-CM | POA: Diagnosis not present

## 2020-12-29 DIAGNOSIS — L700 Acne vulgaris: Secondary | ICD-10-CM | POA: Diagnosis not present

## 2020-12-29 DIAGNOSIS — Z5181 Encounter for therapeutic drug level monitoring: Secondary | ICD-10-CM | POA: Diagnosis not present

## 2021-01-01 ENCOUNTER — Other Ambulatory Visit (HOSPITAL_BASED_OUTPATIENT_CLINIC_OR_DEPARTMENT_OTHER): Payer: Self-pay

## 2021-01-01 MED ORDER — OXYCODONE HCL 30 MG PO TABS
30.0000 mg | ORAL_TABLET | Freq: Four times a day (QID) | ORAL | 0 refills | Status: DC | PRN
  Filled 2021-01-03: qty 120, 30d supply, fill #0

## 2021-01-01 MED ORDER — METHADONE HCL 10 MG PO TABS
ORAL_TABLET | ORAL | 0 refills | Status: DC
  Filled 2021-01-03: qty 270, 30d supply, fill #0

## 2021-01-03 ENCOUNTER — Other Ambulatory Visit (HOSPITAL_BASED_OUTPATIENT_CLINIC_OR_DEPARTMENT_OTHER): Payer: Self-pay

## 2021-01-19 ENCOUNTER — Encounter (HOSPITAL_COMMUNITY): Payer: Self-pay | Admitting: Emergency Medicine

## 2021-01-19 ENCOUNTER — Emergency Department (HOSPITAL_COMMUNITY): Payer: 59

## 2021-01-19 ENCOUNTER — Other Ambulatory Visit: Payer: Self-pay

## 2021-01-19 ENCOUNTER — Emergency Department (HOSPITAL_COMMUNITY)
Admission: EM | Admit: 2021-01-19 | Discharge: 2021-01-19 | Disposition: A | Discharge status: Home / Self Care | Payer: 59 | Attending: Emergency Medicine | Admitting: Emergency Medicine

## 2021-01-19 DIAGNOSIS — R7309 Other abnormal glucose: Secondary | ICD-10-CM | POA: Diagnosis not present

## 2021-01-19 DIAGNOSIS — S91001A Unspecified open wound, right ankle, initial encounter: Secondary | ICD-10-CM | POA: Diagnosis not present

## 2021-01-19 DIAGNOSIS — S99911A Unspecified injury of right ankle, initial encounter: Secondary | ICD-10-CM | POA: Diagnosis present

## 2021-01-19 DIAGNOSIS — X58XXXA Exposure to other specified factors, initial encounter: Secondary | ICD-10-CM | POA: Diagnosis not present

## 2021-01-19 DIAGNOSIS — L97311 Non-pressure chronic ulcer of right ankle limited to breakdown of skin: Secondary | ICD-10-CM | POA: Diagnosis not present

## 2021-01-19 DIAGNOSIS — D5700 Hb-SS disease with crisis, unspecified: Secondary | ICD-10-CM | POA: Diagnosis not present

## 2021-01-19 LAB — CBC WITH DIFFERENTIAL/PLATELET
Abs Immature Granulocytes: 0.03 10*3/uL (ref 0.00–0.07)
Basophils Absolute: 0.1 10*3/uL (ref 0.0–0.1)
Basophils Relative: 1 %
Eosinophils Absolute: 0.2 10*3/uL (ref 0.0–0.5)
Eosinophils Relative: 2 %
HCT: 31.6 % — ABNORMAL LOW (ref 39.0–52.0)
Hemoglobin: 11.3 g/dL — ABNORMAL LOW (ref 13.0–17.0)
Immature Granulocytes: 0 %
Lymphocytes Relative: 42 %
Lymphs Abs: 4.4 10*3/uL — ABNORMAL HIGH (ref 0.7–4.0)
MCH: 34.2 pg — ABNORMAL HIGH (ref 26.0–34.0)
MCHC: 35.8 g/dL (ref 30.0–36.0)
MCV: 95.8 fL (ref 80.0–100.0)
Monocytes Absolute: 1.5 10*3/uL — ABNORMAL HIGH (ref 0.1–1.0)
Monocytes Relative: 14 %
Neutro Abs: 4.3 10*3/uL (ref 1.7–7.7)
Neutrophils Relative %: 41 %
Platelets: 358 10*3/uL (ref 150–400)
RBC: 3.3 MIL/uL — ABNORMAL LOW (ref 4.22–5.81)
RDW: 19.4 % — ABNORMAL HIGH (ref 11.5–15.5)
WBC: 10.5 10*3/uL (ref 4.0–10.5)
nRBC: 2.5 % — ABNORMAL HIGH (ref 0.0–0.2)

## 2021-01-19 LAB — COMPREHENSIVE METABOLIC PANEL
ALT: 24 U/L (ref 0–44)
AST: 48 U/L — ABNORMAL HIGH (ref 15–41)
Albumin: 4.3 g/dL (ref 3.5–5.0)
Alkaline Phosphatase: 57 U/L (ref 38–126)
Anion gap: 7 (ref 5–15)
BUN: 8 mg/dL (ref 6–20)
CO2: 26 mmol/L (ref 22–32)
Calcium: 9.2 mg/dL (ref 8.9–10.3)
Chloride: 106 mmol/L (ref 98–111)
Creatinine, Ser: 0.69 mg/dL (ref 0.61–1.24)
GFR, Estimated: >60 mL/min (ref >60)
Glucose, Bld: 115 mg/dL — ABNORMAL HIGH (ref 70–99)
Potassium: 4.3 mmol/L (ref 3.5–5.1)
Sodium: 139 mmol/L (ref 135–145)
Total Bilirubin: 3 mg/dL — ABNORMAL HIGH (ref 0.3–1.2)
Total Protein: 8.1 g/dL (ref 6.5–8.1)

## 2021-01-19 LAB — LACTIC ACID, PLASMA: Lactic Acid, Venous: 1 mmol/L (ref 0.5–1.9)

## 2021-01-19 LAB — CBG MONITORING, ED: Glucose-Capillary: 110 mg/dL — ABNORMAL HIGH (ref 70–99)

## 2021-01-19 LAB — RETICULOCYTES
Immature Retic Fract: 41.2 % — ABNORMAL HIGH (ref 2.3–15.9)
RBC.: 3.3 MIL/uL — ABNORMAL LOW (ref 4.22–5.81)
Retic Count, Absolute: 375 10*3/uL — ABNORMAL HIGH (ref 19.0–186.0)
Retic Ct Pct: 10.7 % — ABNORMAL HIGH (ref 0.4–3.1)

## 2021-01-19 IMAGING — DX DG ANKLE COMPLETE 3+V*R*
3 series · 3 of 3 positions shown · non-contrast
Comparison: None.

CLINICAL DATA: Wound

EXAM:
RIGHT ANKLE - COMPLETE 3+ VIEW

[ankle ap]
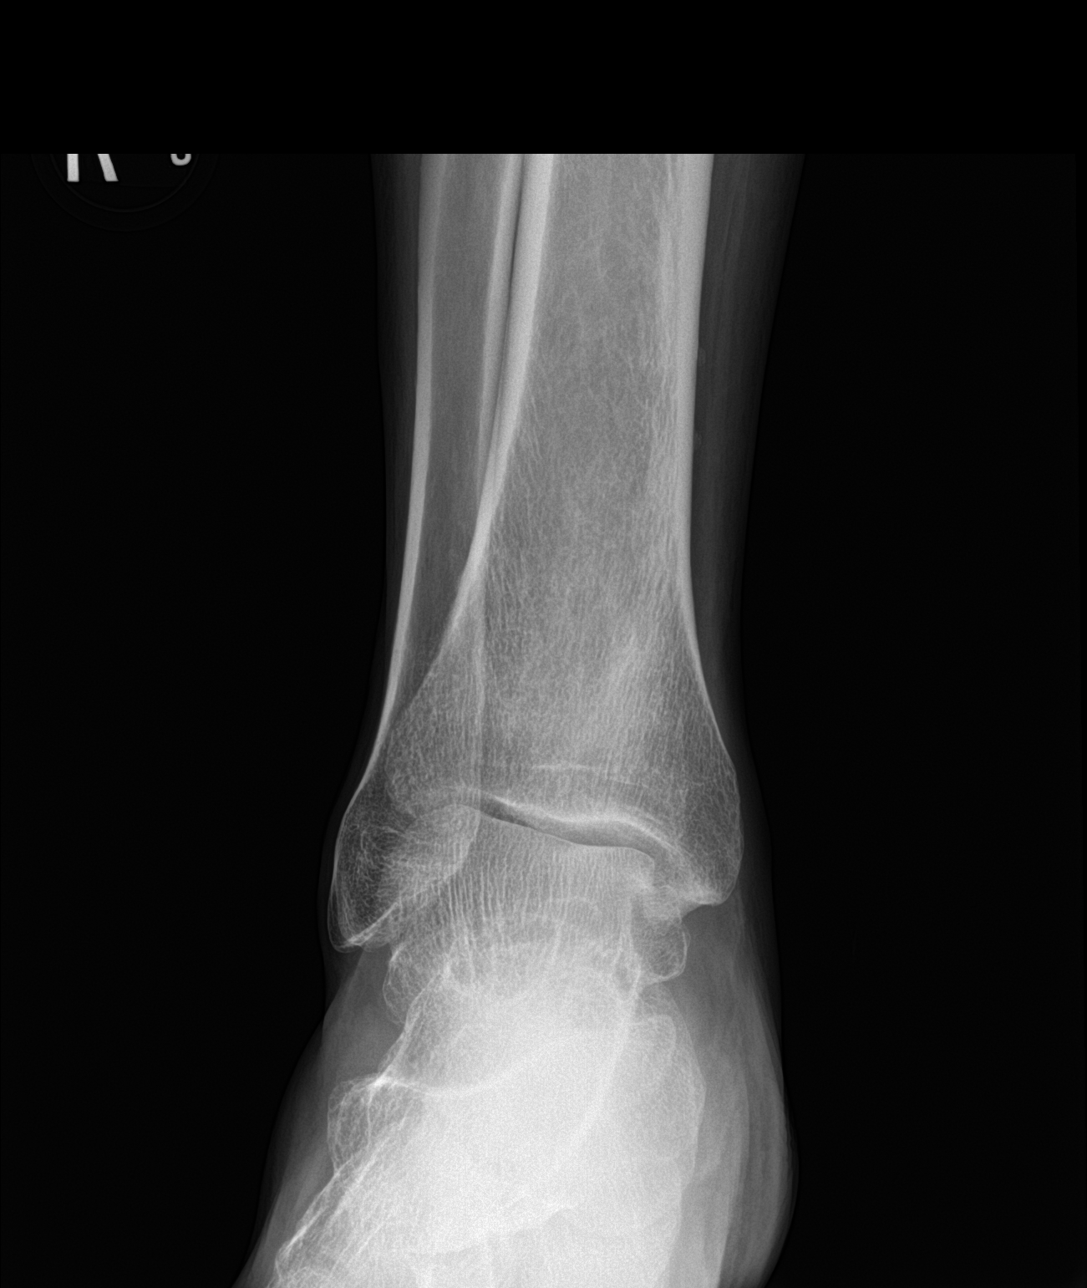

[ankle obl]
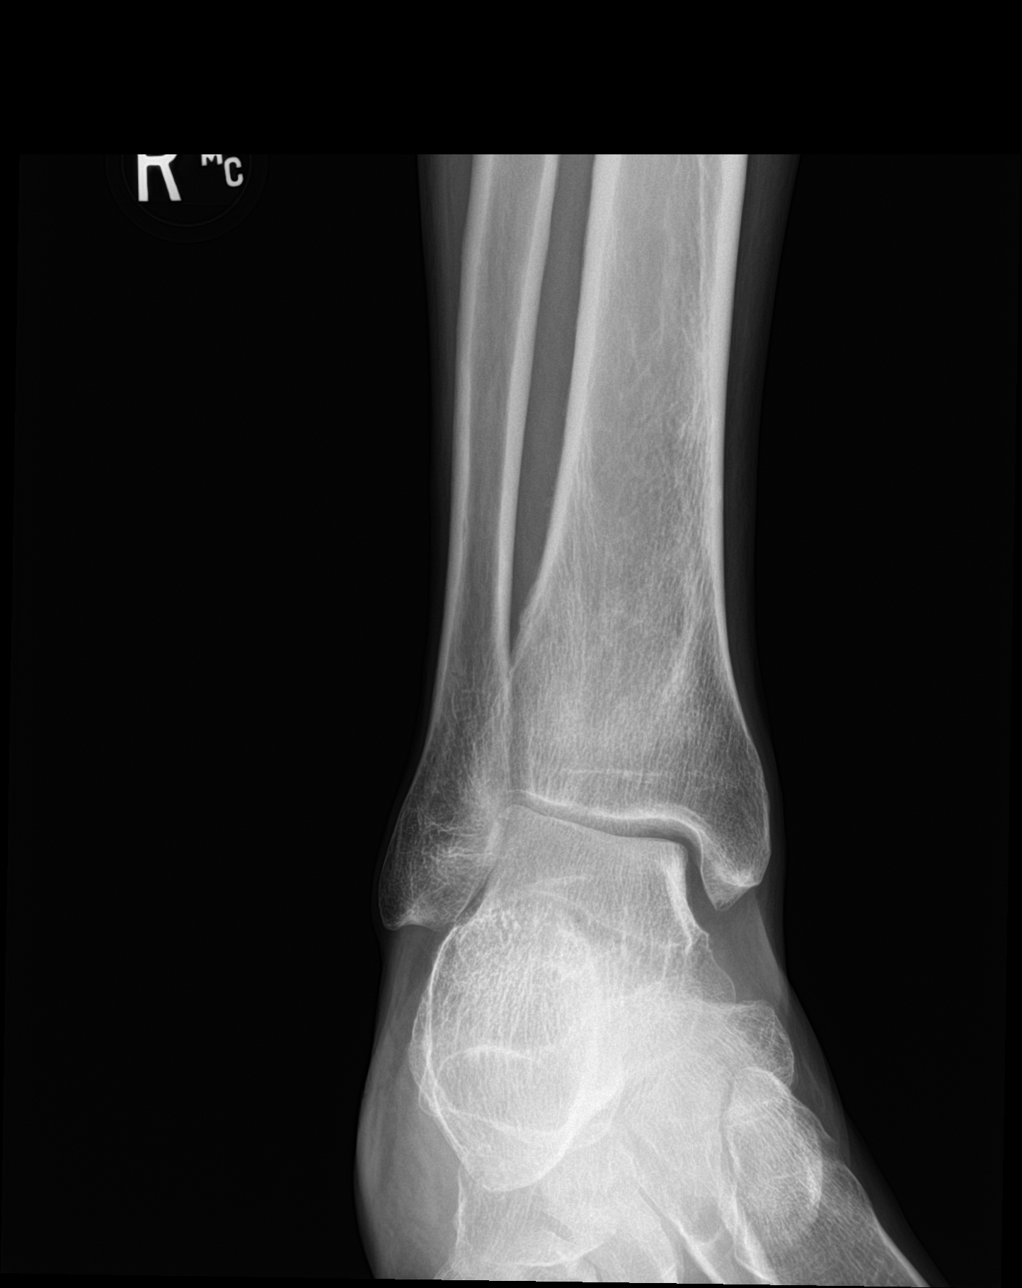

[ankle lat]
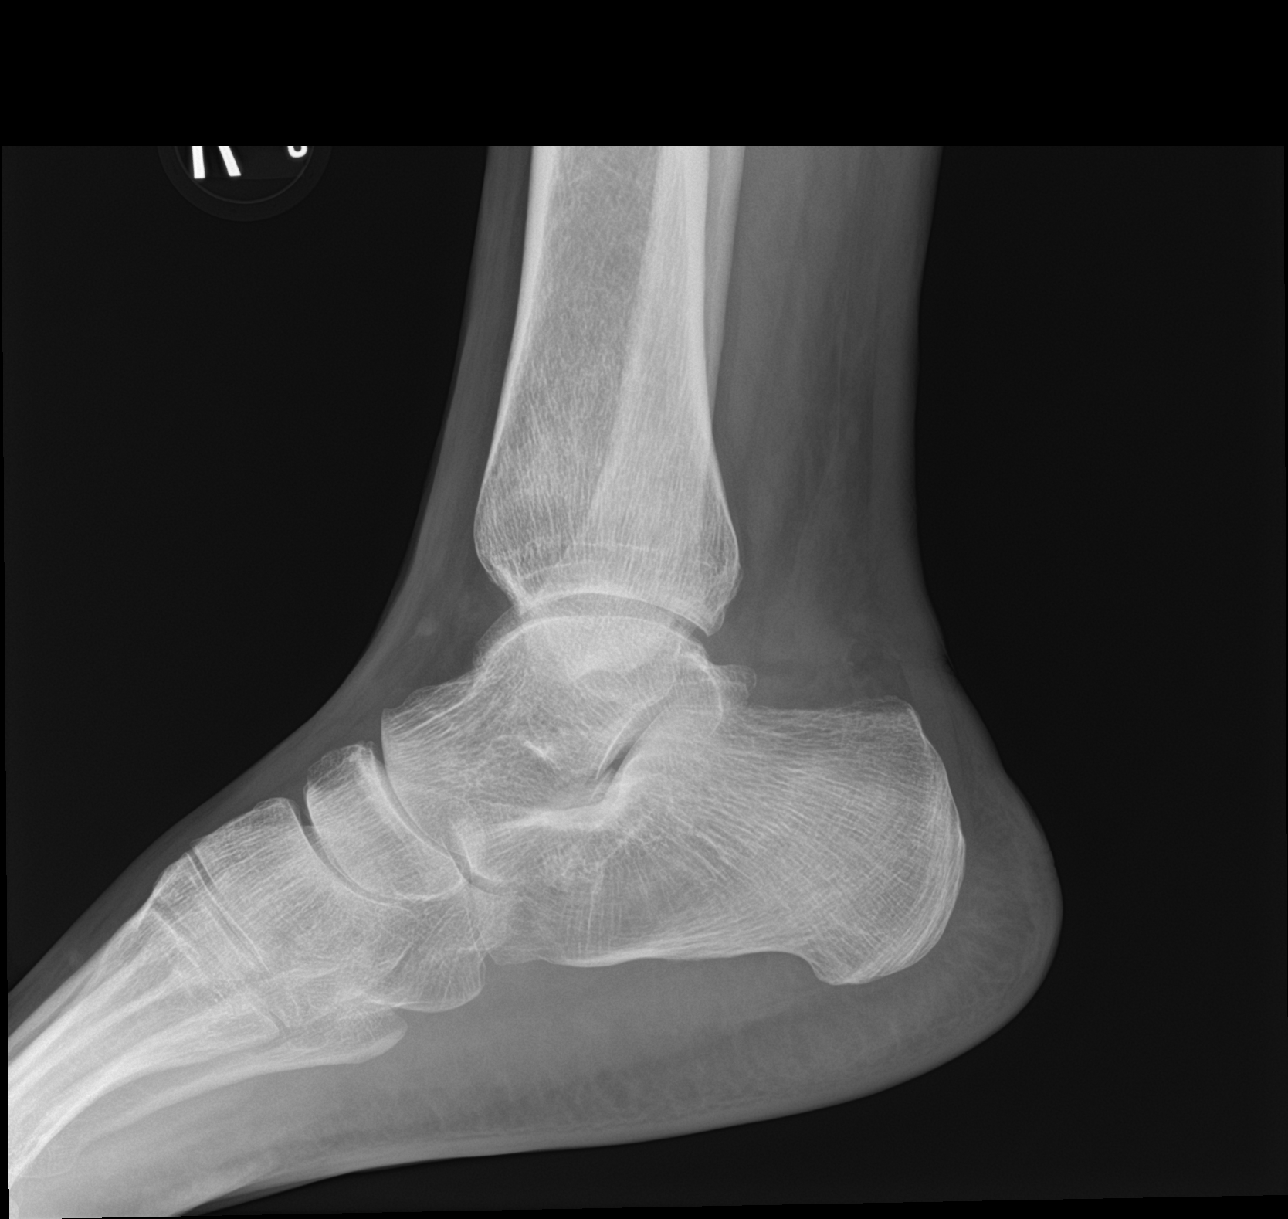

[3 of 3 positions shown; findings below may reference images not displayed]

FINDINGS: No fracture or malalignment. No soft tissue emphysema. Small wound
or ulcer posterior to the ankle. No convincing osseous destructive
change.
IMPRESSION: Probable wound or ulcer posterior to the ankle. No definite acute
osseous abnormality

## 2021-01-19 MED ORDER — HYDROMORPHONE HCL 2 MG/ML IJ SOLN
2.0000 mg | INTRAMUSCULAR | Status: AC
  Administered 2021-01-19: 2 mg via INTRAVENOUS
  Filled 2021-01-19: qty 1

## 2021-01-19 MED ORDER — SODIUM CHLORIDE 0.45 % IV SOLN: INTRAVENOUS | Status: DC

## 2021-01-19 MED ORDER — DIPHENHYDRAMINE HCL 50 MG/ML IJ SOLN
25.0000 mg | Freq: Once | INTRAMUSCULAR | Status: AC
  Administered 2021-01-19: 25 mg via INTRAVENOUS
  Filled 2021-01-19: qty 1

## 2021-01-19 MED ORDER — KETOROLAC TROMETHAMINE 15 MG/ML IJ SOLN
15.0000 mg | INTRAMUSCULAR | Status: AC
  Administered 2021-01-19: 15 mg via INTRAVENOUS
  Filled 2021-01-19: qty 1

## 2021-01-19 MED ORDER — PIPERACILLIN-TAZOBACTAM 3.375 G IVPB 30 MIN
3.3750 g | Freq: Once | INTRAVENOUS | Status: AC
  Administered 2021-01-19: 3.375 g via INTRAVENOUS
  Filled 2021-01-19: qty 50

## 2021-01-19 MED ORDER — VANCOMYCIN HCL IN DEXTROSE 1-5 GM/200ML-% IV SOLN: 1000.0000 mg | Freq: Once | INTRAVENOUS | Status: DC

## 2021-01-19 MED ORDER — CLINDAMYCIN HCL 300 MG PO CAPS: 300.0000 mg | ORAL_CAPSULE | Freq: Three times a day (TID) | ORAL | 0 refills | Status: DC

## 2021-01-19 NOTE — Discharge Instructions (Addendum)
Take clindamycin 3 times daily for a week for your ankle ulcer   See your sickle cell clinic for follow-up and continue current pain meds  Please also follow-up with your wound care doctor  Return to ER if you have worse purulent drainage, fever, chest pain, trouble breathing.

## 2021-01-19 NOTE — ED Provider Notes (Signed)
[facility]-EMERGENCY DEPT Provider Note   CSN: [id number] Arrival date & time: 01/19/21  1507     History Chief Complaint  Patient presents with   Sickle Cell Pain Crisis   Wound Infection    Michael Mullins is a 40 y.o. male history of sickle cell anemia, here with right ankle wound and sickle cell pain crisis.  Patient states that he has right ankle wound since yesterday.  He denies any trauma or injury.  He states that when he gets these ulcers, it is usually associated with sickle cell pain.  He has diffuse pain in his leg as well.  Denies any chest pain shortness of breath.  Denies any fevers.  Patient takes oxycodone at baseline.  Patient follows up with [facility] for sickle cell.   The history is provided by the patient.      History reviewed. No pertinent past medical history.  Patient Active Problem List   Diagnosis Date Noted   FOOT ULCER 01/14/2009   CHEST PAIN 01/14/2009    History reviewed. No pertinent surgical history.     History reviewed. No pertinent family history.  Social History   Tobacco Use   Smoking status: Never   Smokeless tobacco: Never    Home Medications Prior to Admission medications   Medication Sig Start Date End Date Taking? Authorizing Provider  CLARAVIS 40 MG capsule Take 1 capsule (40 mg total) by mouth 2 times daily. Patient taking differently: Take 40 mg by mouth daily. 11/28/20  Yes   empagliflozin (JARDIANCE) 10 MG TABS tablet Take 1 tablet (10 mg total) by mouth daily. 11/03/20  Yes   ergocalciferol (VITAMIN D2) 1.25 MG (50000 UT) capsule Take 1 capsule by mouth every 30 days 08/07/20  Yes   ferrous sulfate (FEROSUL) 325 (65 FE) MG tablet Take by mouth. Patient taking differently: Take 325 mg by mouth every other day. 08/07/20  Yes   folic acid (FOLVITE) 1 MG tablet Take 1 tablet by mouth daily 08/07/20  Yes   ISOtretinoin (ACCUTANE) 40 MG capsule Take 1 capsule (40 mg total) by mouth 2 times daily. Patient  taking differently: Take 40 mg by mouth daily. 11/28/20  Yes   lisinopril (ZESTRIL) 2.5 MG tablet Take 1 tablet (2.5 mg total) by mouth daily. 11/03/20  Yes   methadone (DOLOPHINE) 10 MG tablet Take 3 tablets by mouth 3 times daily. 01/01/21  Yes   OXBRYTA 500 MG TABS tablet Take 1,500 mg by mouth daily. 01/01/21  Yes [provider]  oxycodone (ROXICODONE) 30 MG immediate release tablet Take 1 tablet by mouth every 6 hours as needed for Pain. 01/01/21  Yes   metFORMIN (GLUCOPHAGE) 500 MG tablet Take 2 tablets by mouth twice daily with meals Patient not taking: Reported on 01/19/2021 08/08/20     metFORMIN (GLUCOPHAGE-XR) 500 MG 24 hr tablet Take 1 tablet by mouth daily with dinner,then increase to 1 tablet in the morning & 1 tablet in the evening Patient not taking: Reported on 01/19/2021 07/11/20     naloxone [facility]) nasal spray 4 mg/0.1 mL Use as directed every 2-3 minutes until emergency arrives, call 911 Patient not taking: Reported on 01/19/2021 08/07/20     triamcinolone cream (KENALOG) 0.1 % Apply to affected skin of arms twice daily as needed. NEVER to face/neck/groin. Patient not taking: No sig reported 11/28/20       Allergies    Morphine and Vancomycin  Review of Systems   Review of Systems  Skin:  Positive for wound.  All other systems reviewed and are negative.  Physical Exam Updated Vital Signs BP 108/65 (BP Location: Right Arm)   Pulse 61   Temp 98.2 F (36.8 C) (Oral)   Resp 18   SpO2 94%   Physical Exam Vitals and nursing note reviewed.  Constitutional:      Comments: Uncomfortable   HENT:     Head: Normocephalic.     Nose: Nose normal.     Mouth/Throat:     Mouth: Mucous membranes are moist.  Eyes:     Extraocular Movements: Extraocular movements intact.     Pupils: Pupils are equal, round, and reactive to light.  Cardiovascular:     Rate and Rhythm: Normal rate and regular rhythm.     Pulses: Normal pulses.     Heart sounds: Normal heart sounds.   Pulmonary:     Effort: Pulmonary effort is normal.     Breath sounds: Normal breath sounds.  Abdominal:     General: Abdomen is flat.     Palpations: Abdomen is soft.  Musculoskeletal:     Cervical back: Normal range of motion and neck supple.     Comments: R ankle ulcer with purulent drainage   Skin:    General: Skin is warm.     Capillary Refill: Capillary refill takes less than 2 seconds.     Findings: Erythema present.  Neurological:     General: No focal deficit present.     Mental Status: He is alert and oriented to person, place, and time.  Psychiatric:        Mood and Affect: Mood normal.        Behavior: Behavior normal.    ED Results / Procedures / Treatments   Labs (all labs ordered are listed, but only abnormal results are displayed) Labs Reviewed  CBC WITH DIFFERENTIAL/PLATELET - Abnormal; Notable for the following components:      Result Value   RBC 3.30 (*)    Hemoglobin 11.3 (*)    HCT 31.6 (*)    MCH 34.2 (*)    RDW 19.4 (*)    nRBC 2.5 (*)    Lymphs Abs 4.4 (*)    Monocytes Absolute 1.5 (*)    All other components within normal limits  COMPREHENSIVE METABOLIC PANEL - Abnormal; Notable for the following components:   Glucose, Bld 115 (*)    AST 48 (*)    Total Bilirubin 3.0 (*)    All other components within normal limits  RETICULOCYTES - Abnormal; Notable for the following components:   Retic Ct Pct 10.7 (*)    RBC. 3.30 (*)    Retic Count, Absolute 375.0 (*)    Immature Retic Fract 41.2 (*)    All other components within normal limits  CBG MONITORING, ED - Abnormal; Notable for the following components:   Glucose-Capillary 110 (*)    All other components within normal limits  LACTIC ACID, PLASMA  URINALYSIS, ROUTINE W REFLEX MICROSCOPIC  LACTIC ACID, PLASMA    EKG None  Radiology DG Ankle Complete Right  Result Date: 01/19/2021 CLINICAL DATA:  Wound EXAM: RIGHT ANKLE - COMPLETE 3+ VIEW COMPARISON:  None. FINDINGS: No fracture or  malalignment. No soft tissue emphysema. Small wound or ulcer posterior to the ankle. No convincing osseous destructive change. IMPRESSION: Probable wound or ulcer posterior to the ankle. No definite acute osseous abnormality Electronically Signed   By: Jasmine Pang M.D.   On: 01/19/2021 16:13  Procedures Procedures   Medications Ordered in ED Medications  0.45 % sodium chloride infusion ( Intravenous New Bag/Given 01/19/21 1604)  ketorolac (TORADOL) 15 MG/ML injection 15 mg (15 mg Intravenous Given 01/19/21 1604)  HYDROmorphone (DILAUDID) injection 2 mg (2 mg Intravenous Given 01/19/21 1606)  HYDROmorphone (DILAUDID) injection 2 mg (2 mg Intravenous Given 01/19/21 1710)  piperacillin-tazobactam (ZOSYN) IVPB 3.375 g (0 g Intravenous Stopped 01/19/21 1711)  diphenhydrAMINE (BENADRYL) injection 25 mg (25 mg Intravenous Given 01/19/21 1709)    ED Course  I have reviewed the triage vital signs and the nursing notes.  Pertinent labs & imaging results that were available during my care of the patient were reviewed by me and considered in my medical decision making (see chart for details).    MDM Rules/Calculators/A&P                           Michael Mullins is a 40 y.o. male here with R ankle ulcer.  There is seem to be some surrounding erythema.  Plan to get labs and x-rays.   6:05 PM X-rays did not show any osteomyelitis.  White blood cell count is normal.  Reticulocytes is elevated at its appropriately.  Patient felt better after pain meds and was given Zosyn.  Will discharge patient home with clindamycin.  Patient has wound care follow-up already   Final Clinical Impression(s) / ED Diagnoses Final diagnoses:  None    Rx / DC Orders ED Discharge Orders     None        Charlynne Pander, MD 01/19/21 1806

## 2021-01-19 NOTE — ED Provider Notes (Signed)
Emergency Medicine Provider Triage Evaluation Note  Michael Mullins , a 40 y.o. male  was evaluated in triage.  Pt complains of right foot pain/ulcer/infection.  Patient states ulcer began about a week ago.  It opened and started draining yesterday.  Since then, he has had worsening pain in his right leg, which he attributes to his sickle cell pain.  He states when he gets ulcers like this, he often develops sickle cell crises.  He has had about 8 of these ulcers.  He also was recently diagnosed with diabetes, states his blood sugars have been well controlled at home.  No fevers or chills.  No chest pain or shortness of breath.  Last took pain medicine just prior to arrival  Review of Systems  Positive: R foot infection, myalgias Negative: fever  Physical Exam  BP 112/61 (BP Location: Left Arm)   Pulse 76   Temp 98.2 F (36.8 C) (Oral)   Resp 18   SpO2 91%  Gen:   Awake, no distress   Resp:  Normal effort  MSK:   Moves extremities without difficulty  Other:  Purulent wound of the R medial ankle. No erythema or warmth of the calf  Medical Decision Making  Medically screening exam initiated at 3:32 PM.  Appropriate orders placed.  ABDULRAHEEM PINEO was informed that the remainder of the evaluation will be completed by another provider, this initial triage assessment does not replace that evaluation, and the importance of remaining in the ED until their evaluation is complete.  Labs and xray   Alveria Apley, PA-C 01/19/21 1534    Milagros Loll, MD 01/22/21 [phone]

## 2021-01-19 NOTE — ED Triage Notes (Signed)
Patient here from home reporting sickle cell pain crisis with right ankle wound infection. States that he took normal pain meds at 2pm. States that he normally goes to [place].

## 2021-01-22 ENCOUNTER — Inpatient Hospital Stay (HOSPITAL_COMMUNITY)
Admission: EM | Admit: 2021-01-22 | Discharge: 2021-01-25 | DRG: 812 | Disposition: A | Discharge status: Home / Self Care | Payer: 59 | Attending: Internal Medicine | Admitting: Internal Medicine

## 2021-01-22 ENCOUNTER — Emergency Department (HOSPITAL_COMMUNITY): Payer: 59

## 2021-01-22 ENCOUNTER — Encounter (HOSPITAL_COMMUNITY): Payer: Self-pay | Admitting: Emergency Medicine

## 2021-01-22 ENCOUNTER — Other Ambulatory Visit: Payer: Self-pay

## 2021-01-22 DIAGNOSIS — E1165 Type 2 diabetes mellitus with hyperglycemia: Secondary | ICD-10-CM | POA: Diagnosis not present

## 2021-01-22 DIAGNOSIS — D638 Anemia in other chronic diseases classified elsewhere: Secondary | ICD-10-CM | POA: Diagnosis not present

## 2021-01-22 DIAGNOSIS — G894 Chronic pain syndrome: Secondary | ICD-10-CM | POA: Diagnosis not present

## 2021-01-22 DIAGNOSIS — Z7984 Long term (current) use of oral hypoglycemic drugs: Secondary | ICD-10-CM | POA: Diagnosis not present

## 2021-01-22 DIAGNOSIS — Z885 Allergy status to narcotic agent status: Secondary | ICD-10-CM | POA: Diagnosis not present

## 2021-01-22 DIAGNOSIS — D571 Sickle-cell disease without crisis: Secondary | ICD-10-CM | POA: Diagnosis not present

## 2021-01-22 DIAGNOSIS — L0390 Cellulitis, unspecified: Secondary | ICD-10-CM | POA: Diagnosis not present

## 2021-01-22 DIAGNOSIS — Q8901 Asplenia (congenital): Secondary | ICD-10-CM

## 2021-01-22 DIAGNOSIS — L97319 Non-pressure chronic ulcer of right ankle with unspecified severity: Secondary | ICD-10-CM | POA: Diagnosis present

## 2021-01-22 DIAGNOSIS — E11621 Type 2 diabetes mellitus with foot ulcer: Secondary | ICD-10-CM | POA: Diagnosis present

## 2021-01-22 DIAGNOSIS — D5700 Hb-SS disease with crisis, unspecified: Principal | ICD-10-CM | POA: Diagnosis present

## 2021-01-22 DIAGNOSIS — Z79899 Other long term (current) drug therapy: Secondary | ICD-10-CM

## 2021-01-22 DIAGNOSIS — M7989 Other specified soft tissue disorders: Secondary | ICD-10-CM | POA: Diagnosis not present

## 2021-01-22 DIAGNOSIS — L97309 Non-pressure chronic ulcer of unspecified ankle with unspecified severity: Secondary | ICD-10-CM | POA: Diagnosis present

## 2021-01-22 DIAGNOSIS — M79672 Pain in left foot: Secondary | ICD-10-CM | POA: Diagnosis not present

## 2021-01-22 DIAGNOSIS — M79671 Pain in right foot: Secondary | ICD-10-CM | POA: Diagnosis not present

## 2021-01-22 DIAGNOSIS — S91001A Unspecified open wound, right ankle, initial encounter: Secondary | ICD-10-CM

## 2021-01-22 DIAGNOSIS — Z20822 Contact with and (suspected) exposure to covid-19: Secondary | ICD-10-CM | POA: Diagnosis present

## 2021-01-22 DIAGNOSIS — L97301 Non-pressure chronic ulcer of unspecified ankle limited to breakdown of skin: Secondary | ICD-10-CM

## 2021-01-22 DIAGNOSIS — Z888 Allergy status to other drugs, medicaments and biological substances status: Secondary | ICD-10-CM | POA: Diagnosis not present

## 2021-01-22 HISTORY — DX: Sickle-cell disease without crisis: D57.1

## 2021-01-22 HISTORY — DX: Type 2 diabetes mellitus without complications: E11.9

## 2021-01-22 LAB — CBG MONITORING, ED: Glucose-Capillary: 112 mg/dL — ABNORMAL HIGH (ref 70–99)

## 2021-01-22 IMAGING — CR DG ANKLE COMPLETE 3+V*R*
3 series · 3 of 3 positions shown · non-contrast
Comparison: None.

CLINICAL DATA: Sickle cell disease

EXAM:
RIGHT ANKLE - COMPLETE 3+ VIEW

[x ankle ap right]
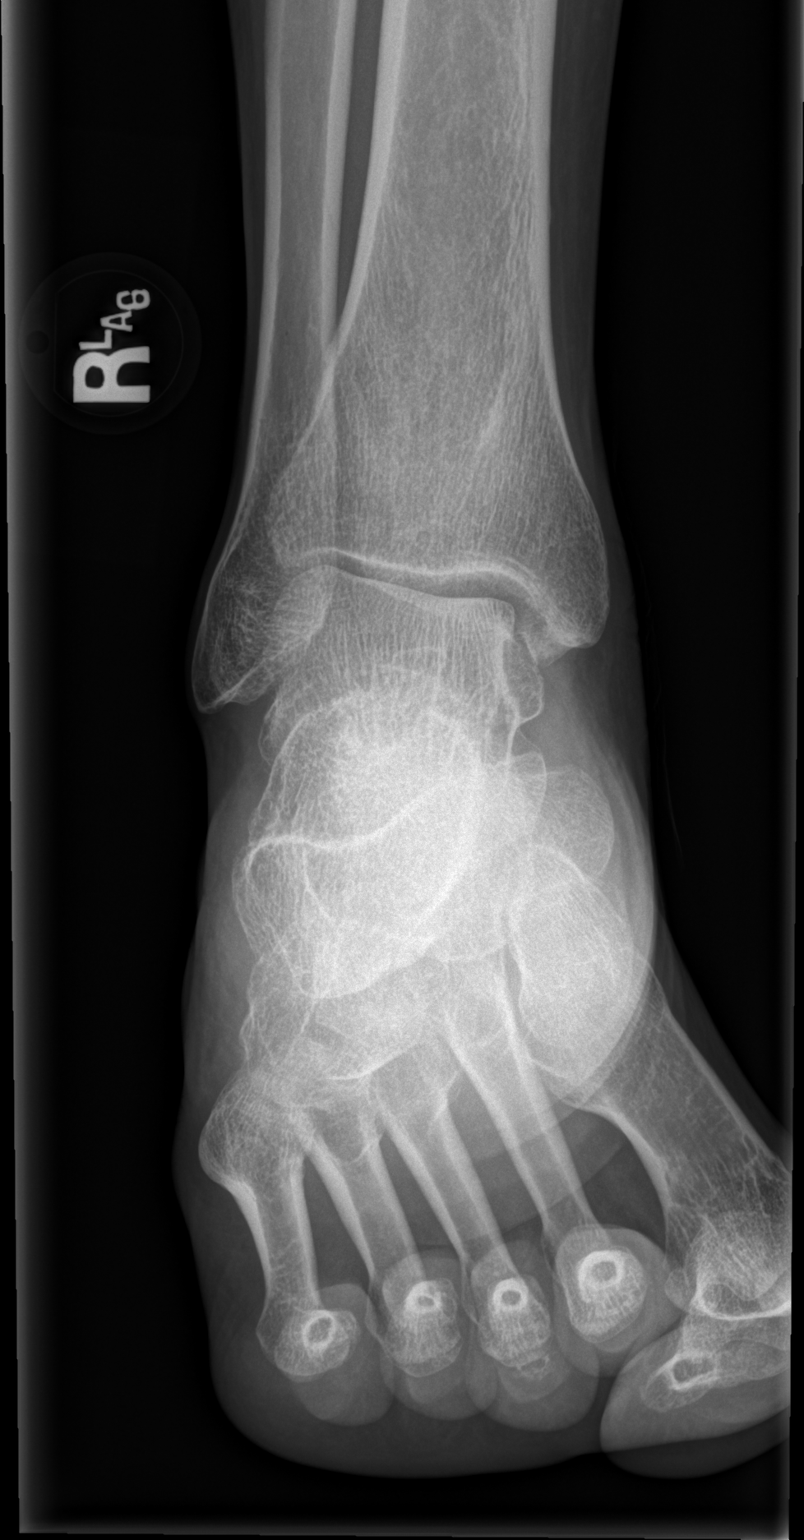

[x ankle obl right]
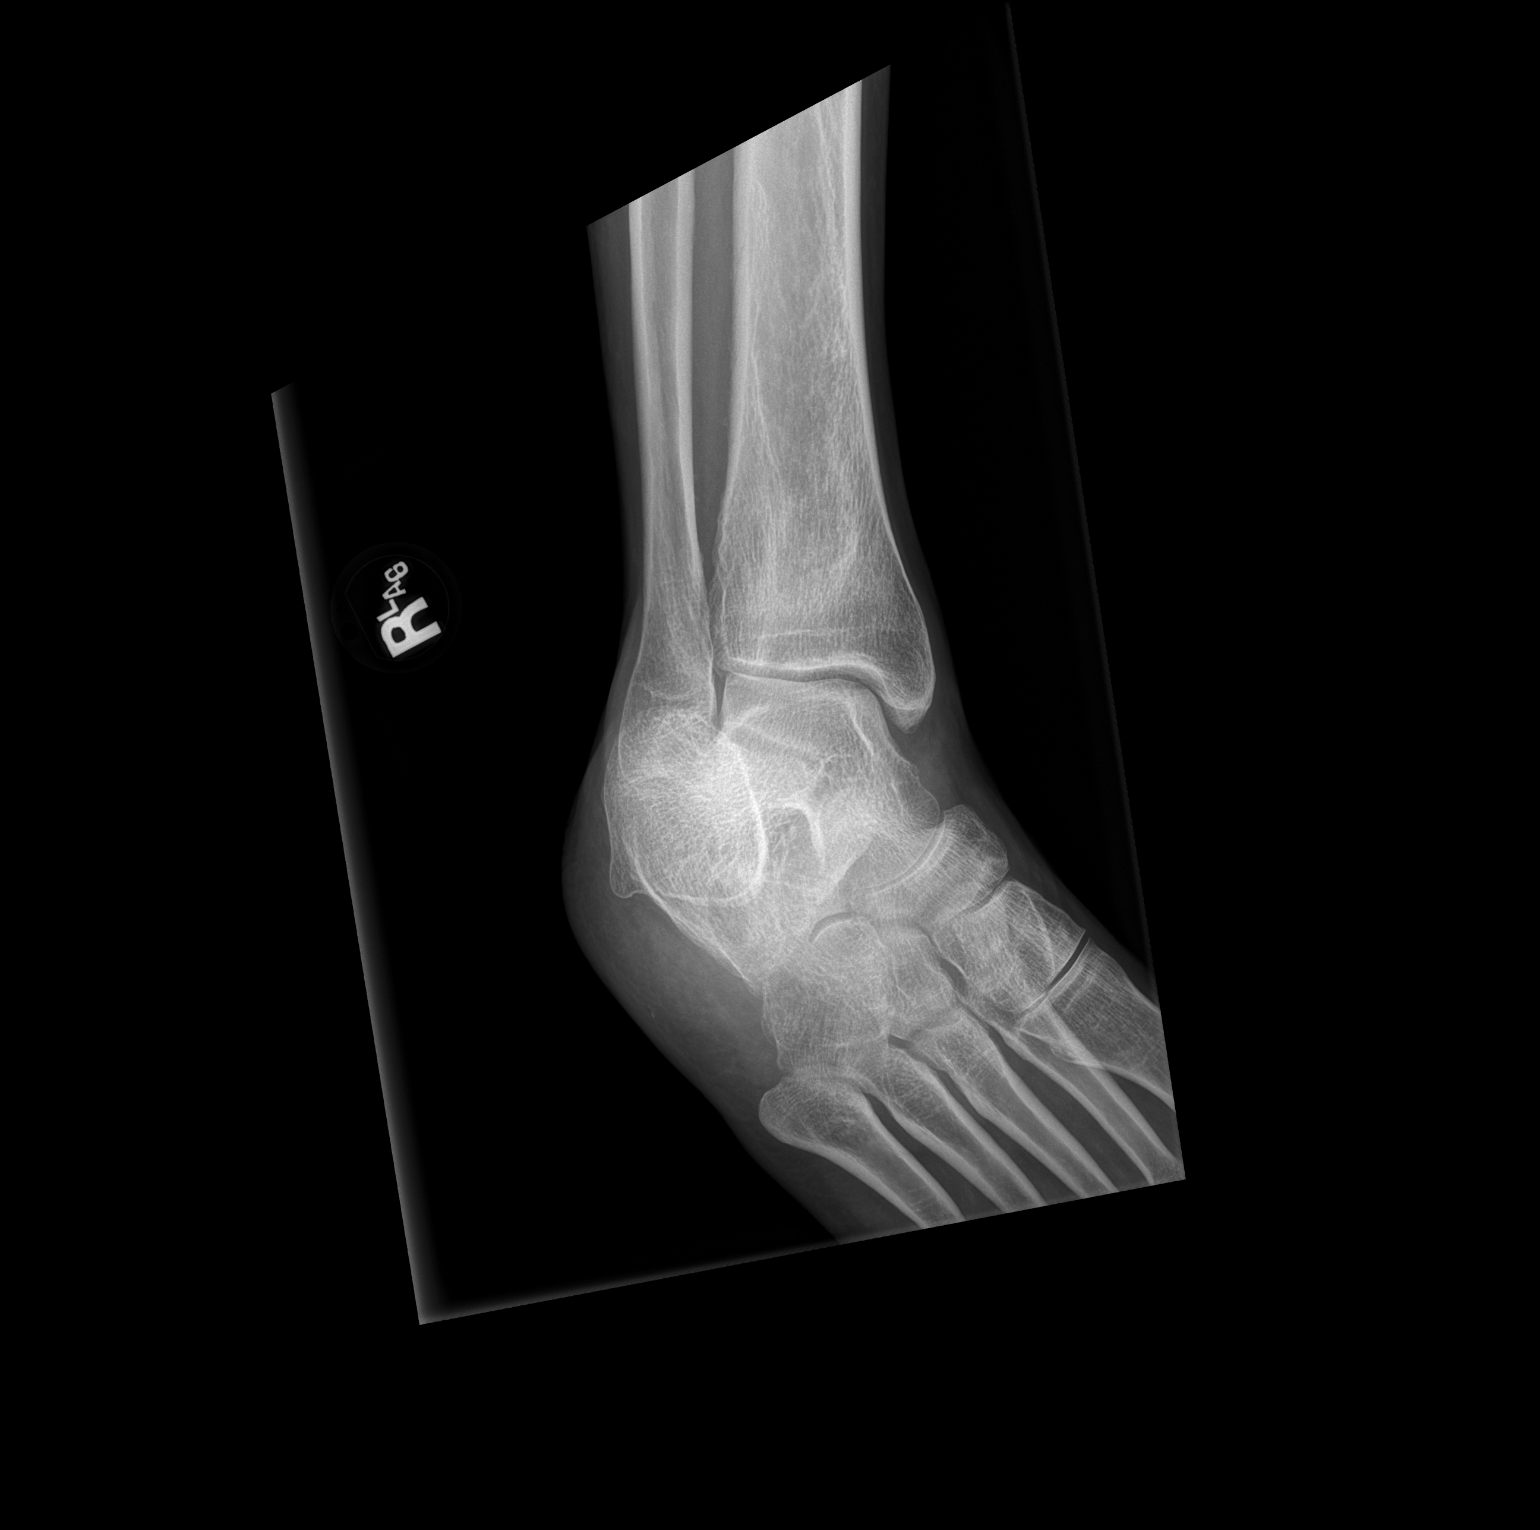

[x ankle lat right]
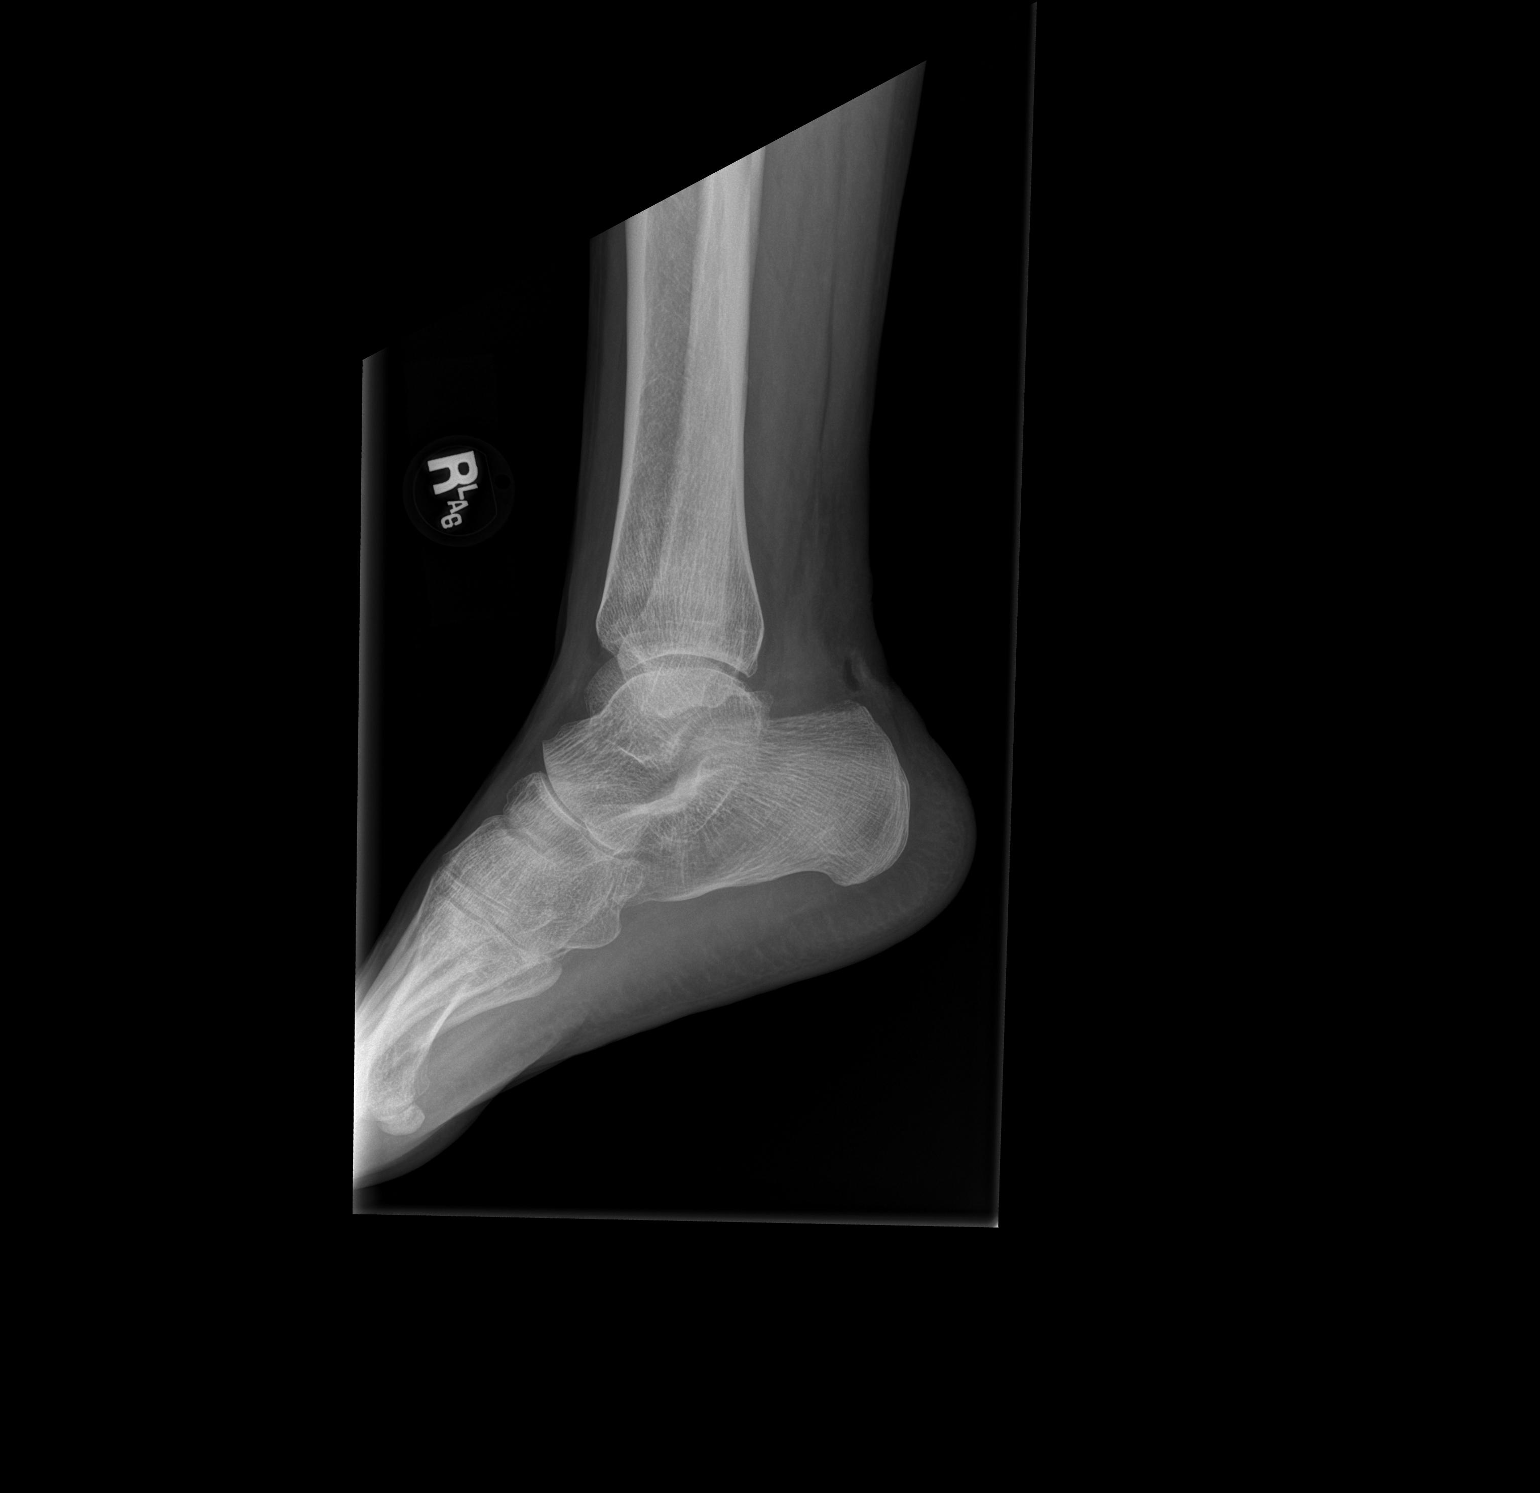

[3 of 3 positions shown; findings below may reference images not displayed]

FINDINGS: No fracture or malalignment. Apparent ulcer at the posterior ankle.
No periostitis or bone destruction.
IMPRESSION: No acute osseous abnormality

## 2021-01-22 IMAGING — CR DG HAND COMPLETE 3+V*R*
3 series · 3 of 3 positions shown · non-contrast
Comparison: No priors.

CLINICAL DATA: 40-year-old male with history of trauma from a fall
complaining of pain in the right hand, most evident along the
metacarpals.

EXAM:
RIGHT HAND - COMPLETE 3+ VIEW

[hand pa]
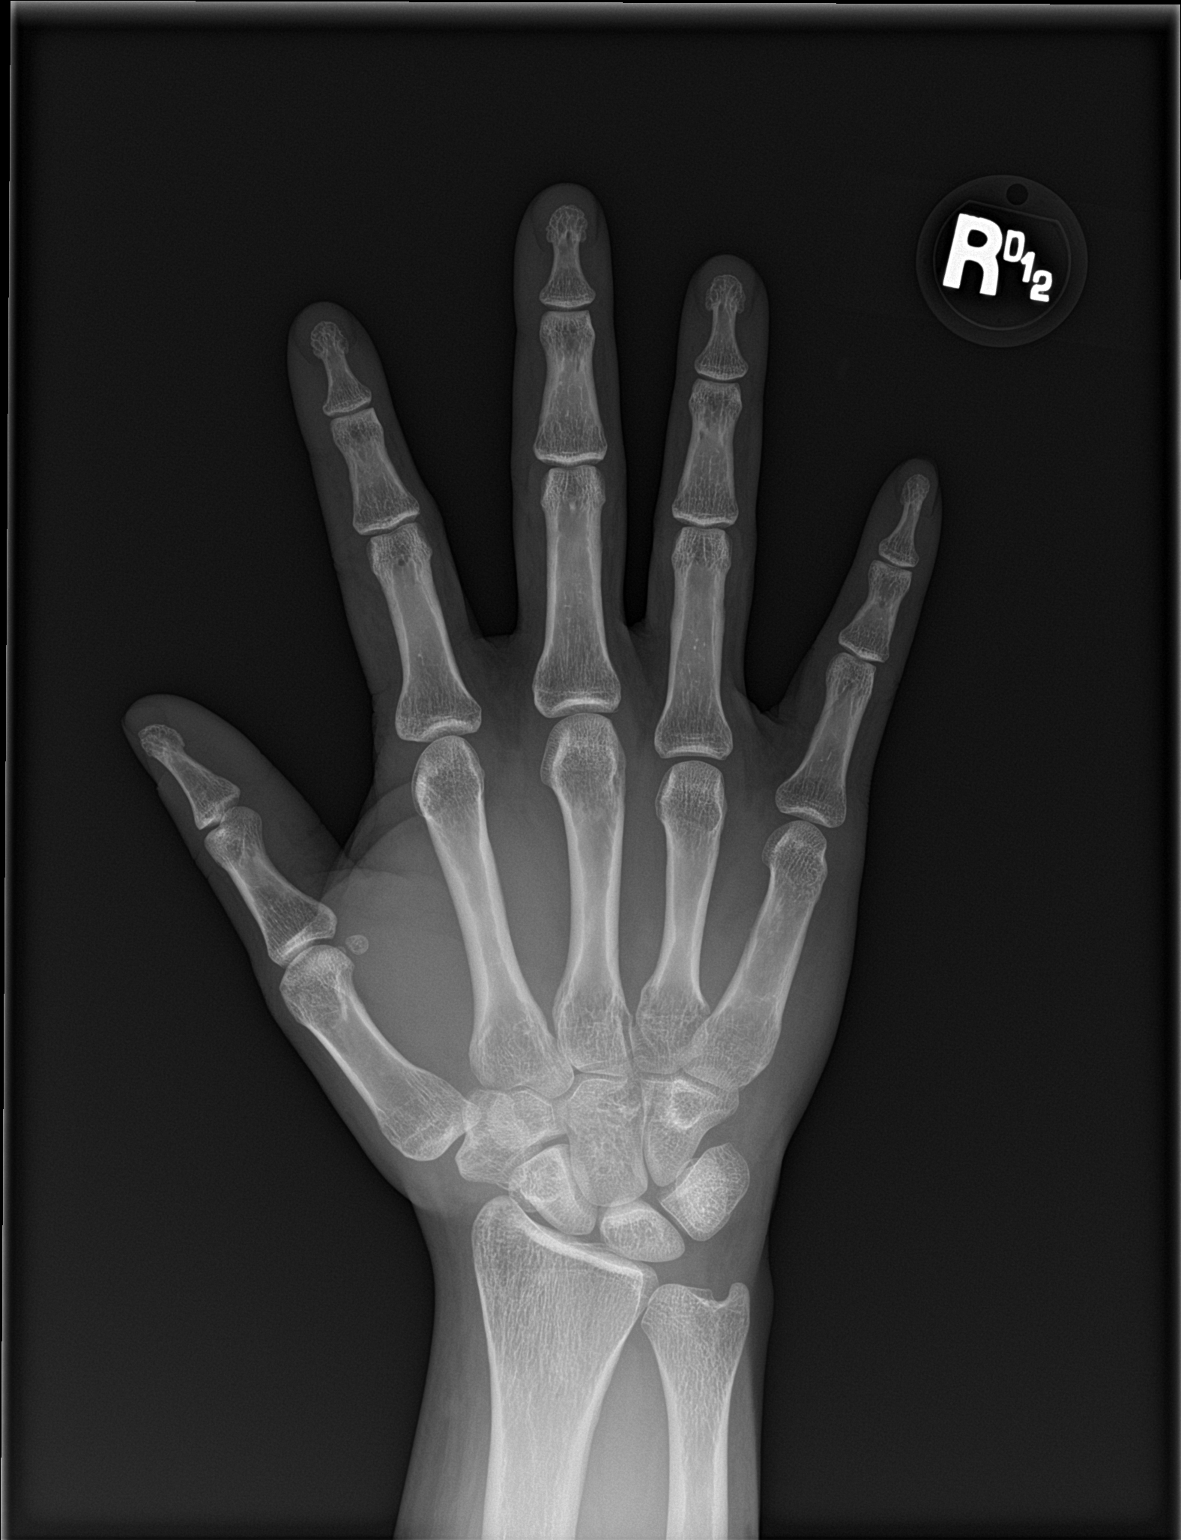

[hand obl]
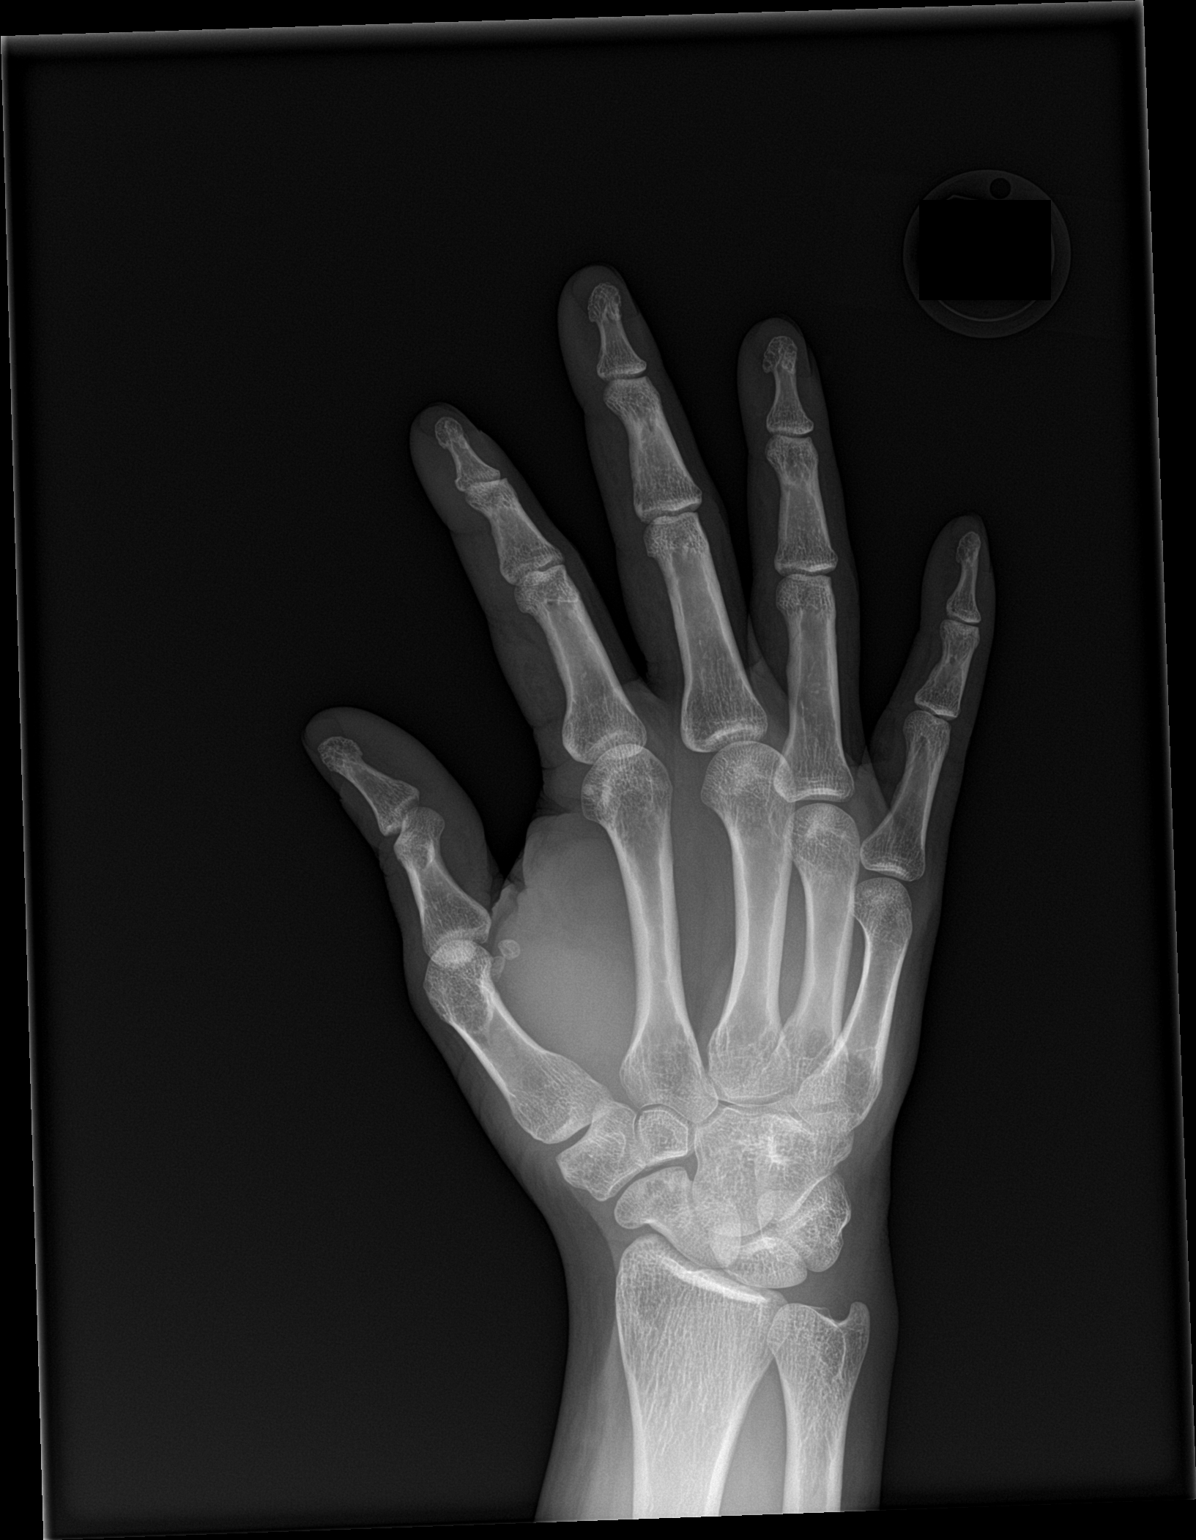

[hand lat]
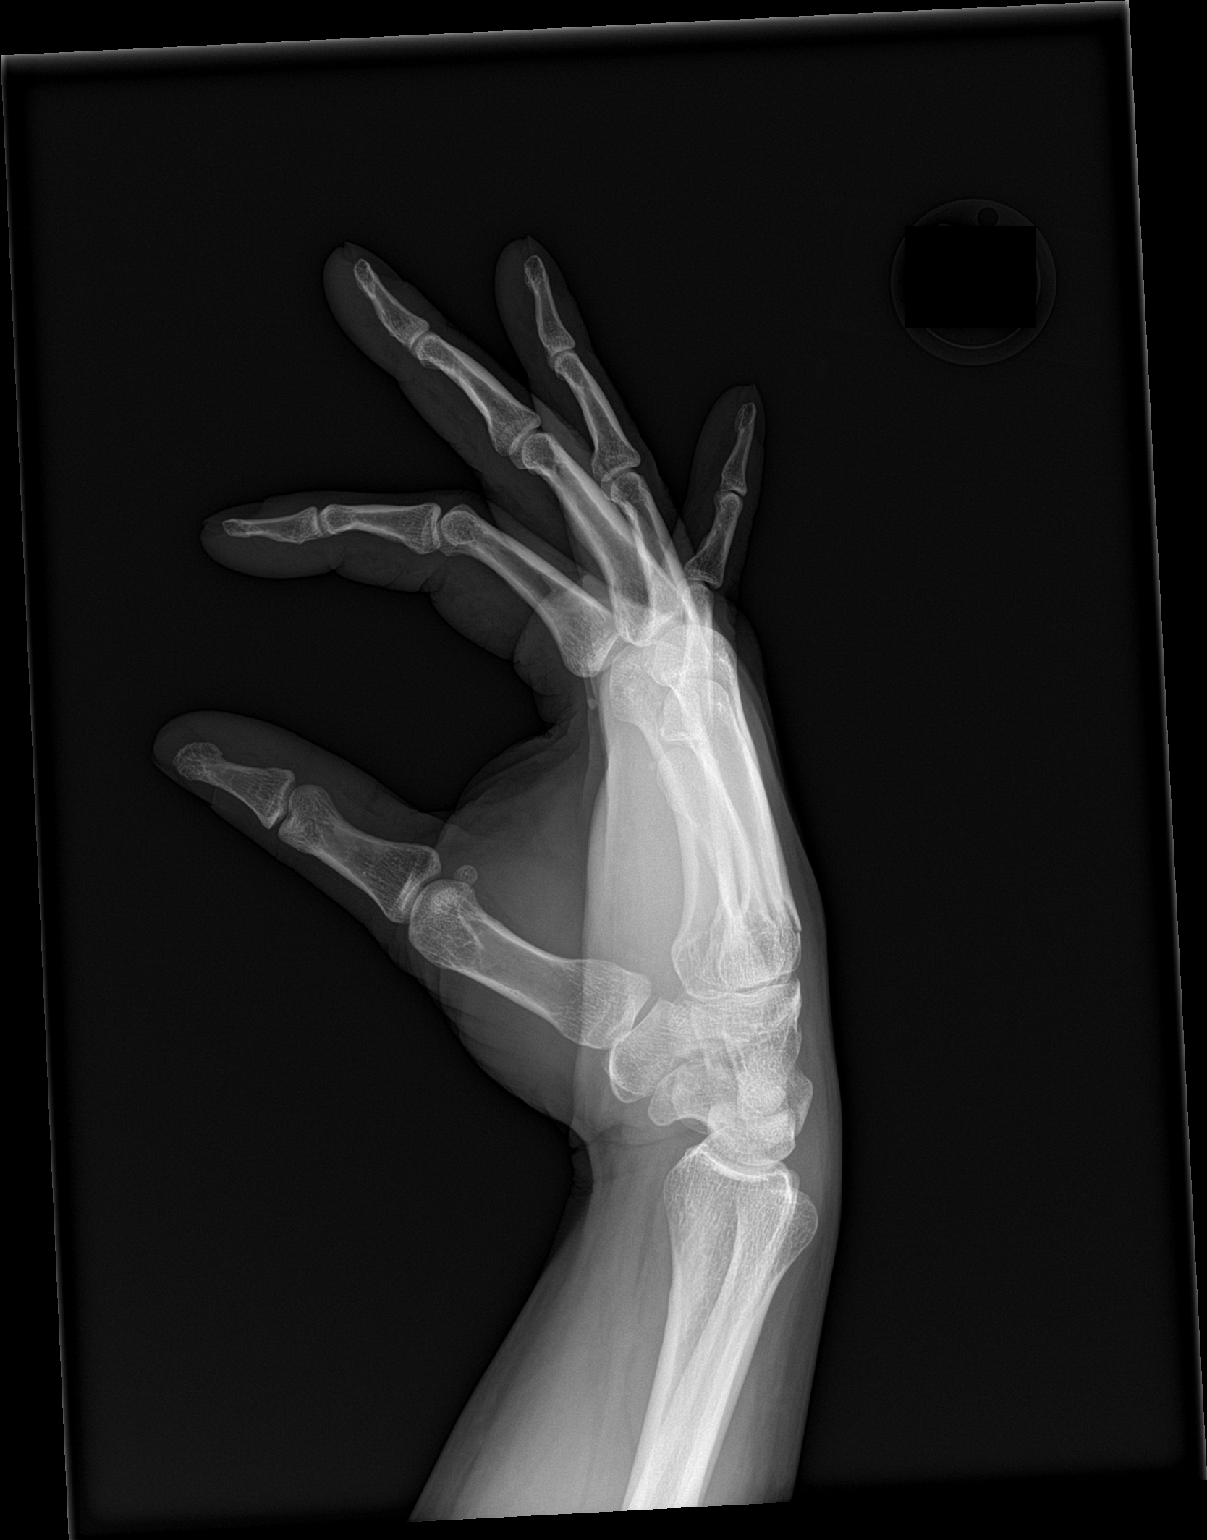

[3 of 3 positions shown; findings below may reference images not displayed]

FINDINGS: There is no evidence of fracture or dislocation. There is no
evidence of arthropathy or other focal bone abnormality. Soft
tissues are unremarkable.
IMPRESSION: Negative.

## 2021-01-22 MED ORDER — DIPHENHYDRAMINE HCL 25 MG PO CAPS
50.0000 mg | ORAL_CAPSULE | Freq: Once | ORAL | Status: AC
  Administered 2021-01-23: 50 mg via ORAL
  Filled 2021-01-22: qty 2

## 2021-01-22 MED ORDER — HYDROMORPHONE HCL 2 MG/ML IJ SOLN
2.0000 mg | Freq: Once | INTRAMUSCULAR | Status: AC
  Administered 2021-01-23: 2 mg via INTRAVENOUS
  Filled 2021-01-22: qty 1

## 2021-01-22 NOTE — ED Triage Notes (Signed)
Patient presents with complaints of sickle cell pain in his legs. He reports taking his home medications, but the pain worsened. He also complains of right foot pain due to an ulcer.

## 2021-01-22 NOTE — ED Provider Notes (Signed)
Emergency Medicine Provider Triage Evaluation Note  Michael Mullins , a 40 y.o. male  was evaluated in triage.  Pt complains of sickle cell pain and wound of the right ankle.  Wound is worsening despite taking Cipro.  No fevers at home.  Increasing pain despite home medications.  Review of Systems  Positive: R leg pain, R foot wound Negative: fever  Physical Exam  BP (!) 138/91 (BP Location: Left Arm)   Pulse 100   Temp 99.1 F (37.3 C) (Oral)   Resp (!) 21   SpO2 94%  Gen:   Awake, no distress   Resp:  Normal effort  MSK:   Moves extremities without difficulty. Wound of the R medial ankle with purulence   Medical Decision Making  Medically screening exam initiated at 10:38 PM.  Appropriate orders placed.  Michael Mullins was informed that the remainder of the evaluation will be completed by another provider, this initial triage assessment does not replace that evaluation, and the importance of remaining in the ED until their evaluation is complete.  Labs, xray   Kendall, [place], PA-C 01/22/21 2239    Franne Forts, DO 01/25/21 1631

## 2021-01-23 ENCOUNTER — Inpatient Hospital Stay (HOSPITAL_COMMUNITY): Payer: 59

## 2021-01-23 ENCOUNTER — Encounter (HOSPITAL_COMMUNITY): Payer: Self-pay | Admitting: Internal Medicine

## 2021-01-23 DIAGNOSIS — Q8901 Asplenia (congenital): Secondary | ICD-10-CM | POA: Diagnosis not present

## 2021-01-23 DIAGNOSIS — M79671 Pain in right foot: Secondary | ICD-10-CM | POA: Diagnosis not present

## 2021-01-23 DIAGNOSIS — Z79899 Other long term (current) drug therapy: Secondary | ICD-10-CM | POA: Diagnosis not present

## 2021-01-23 DIAGNOSIS — D5700 Hb-SS disease with crisis, unspecified: Secondary | ICD-10-CM | POA: Diagnosis present

## 2021-01-23 DIAGNOSIS — Z20822 Contact with and (suspected) exposure to covid-19: Secondary | ICD-10-CM | POA: Diagnosis present

## 2021-01-23 DIAGNOSIS — L0390 Cellulitis, unspecified: Secondary | ICD-10-CM | POA: Diagnosis not present

## 2021-01-23 DIAGNOSIS — E11621 Type 2 diabetes mellitus with foot ulcer: Secondary | ICD-10-CM | POA: Diagnosis present

## 2021-01-23 DIAGNOSIS — D638 Anemia in other chronic diseases classified elsewhere: Secondary | ICD-10-CM | POA: Diagnosis present

## 2021-01-23 DIAGNOSIS — G894 Chronic pain syndrome: Secondary | ICD-10-CM | POA: Diagnosis present

## 2021-01-23 DIAGNOSIS — Z7984 Long term (current) use of oral hypoglycemic drugs: Secondary | ICD-10-CM | POA: Diagnosis not present

## 2021-01-23 DIAGNOSIS — L97309 Non-pressure chronic ulcer of unspecified ankle with unspecified severity: Secondary | ICD-10-CM | POA: Diagnosis present

## 2021-01-23 DIAGNOSIS — E1165 Type 2 diabetes mellitus with hyperglycemia: Secondary | ICD-10-CM | POA: Diagnosis present

## 2021-01-23 DIAGNOSIS — M7989 Other specified soft tissue disorders: Secondary | ICD-10-CM | POA: Diagnosis not present

## 2021-01-23 DIAGNOSIS — M79672 Pain in left foot: Secondary | ICD-10-CM | POA: Diagnosis not present

## 2021-01-23 DIAGNOSIS — Z888 Allergy status to other drugs, medicaments and biological substances status: Secondary | ICD-10-CM | POA: Diagnosis not present

## 2021-01-23 DIAGNOSIS — Z885 Allergy status to narcotic agent status: Secondary | ICD-10-CM | POA: Diagnosis not present

## 2021-01-23 DIAGNOSIS — S91001A Unspecified open wound, right ankle, initial encounter: Secondary | ICD-10-CM | POA: Diagnosis present

## 2021-01-23 DIAGNOSIS — L97319 Non-pressure chronic ulcer of right ankle with unspecified severity: Secondary | ICD-10-CM | POA: Diagnosis present

## 2021-01-23 LAB — CBC WITH DIFFERENTIAL/PLATELET
Abs Immature Granulocytes: 0.04 10*3/uL (ref 0.00–0.07)
Basophils Absolute: 0 10*3/uL (ref 0.0–0.1)
Basophils Relative: 0 %
Eosinophils Absolute: 0.1 10*3/uL (ref 0.0–0.5)
Eosinophils Relative: 1 %
HCT: 36.2 % — ABNORMAL LOW (ref 39.0–52.0)
Hemoglobin: 12.9 g/dL — ABNORMAL LOW (ref 13.0–17.0)
Immature Granulocytes: 0 %
Lymphocytes Relative: 36 %
Lymphs Abs: 3.6 10*3/uL (ref 0.7–4.0)
MCH: 34.2 pg — ABNORMAL HIGH (ref 26.0–34.0)
MCHC: 35.6 g/dL (ref 30.0–36.0)
MCV: 96 fL (ref 80.0–100.0)
Monocytes Absolute: 1.7 10*3/uL — ABNORMAL HIGH (ref 0.1–1.0)
Monocytes Relative: 18 %
Neutro Abs: 4.4 10*3/uL (ref 1.7–7.7)
Neutrophils Relative %: 45 %
Platelets: 332 10*3/uL (ref 150–400)
RBC: 3.77 MIL/uL — ABNORMAL LOW (ref 4.22–5.81)
RDW: 19.9 % — ABNORMAL HIGH (ref 11.5–15.5)
WBC: 9.9 10*3/uL (ref 4.0–10.5)
nRBC: 3.7 % — ABNORMAL HIGH (ref 0.0–0.2)

## 2021-01-23 LAB — COMPREHENSIVE METABOLIC PANEL
ALT: 25 U/L (ref 0–44)
AST: 42 U/L — ABNORMAL HIGH (ref 15–41)
Albumin: 4.8 g/dL (ref 3.5–5.0)
Alkaline Phosphatase: 66 U/L (ref 38–126)
Anion gap: 7 (ref 5–15)
BUN: 7 mg/dL (ref 6–20)
CO2: 27 mmol/L (ref 22–32)
Calcium: 9.5 mg/dL (ref 8.9–10.3)
Chloride: 103 mmol/L (ref 98–111)
Creatinine, Ser: 0.83 mg/dL (ref 0.61–1.24)
GFR, Estimated: >60 mL/min (ref >60)
Glucose, Bld: 126 mg/dL — ABNORMAL HIGH (ref 70–99)
Potassium: 3.8 mmol/L (ref 3.5–5.1)
Sodium: 137 mmol/L (ref 135–145)
Total Bilirubin: 3.5 mg/dL — ABNORMAL HIGH (ref 0.3–1.2)
Total Protein: 9 g/dL — ABNORMAL HIGH (ref 6.5–8.1)

## 2021-01-23 LAB — LACTIC ACID, PLASMA
Lactic Acid, Venous: 1.2 mmol/L (ref 0.5–1.9)
Lactic Acid, Venous: 1.5 mmol/L (ref 0.5–1.9)

## 2021-01-23 LAB — HIV ANTIBODY (ROUTINE TESTING W REFLEX): HIV Screen 4th Generation wRfx: NONREACTIVE

## 2021-01-23 LAB — CBC
HCT: 32 % — ABNORMAL LOW (ref 39.0–52.0)
Hemoglobin: 11.6 g/dL — ABNORMAL LOW (ref 13.0–17.0)
MCH: 34.3 pg — ABNORMAL HIGH (ref 26.0–34.0)
MCHC: 36.3 g/dL — ABNORMAL HIGH (ref 30.0–36.0)
MCV: 94.7 fL (ref 80.0–100.0)
Platelets: 283 10*3/uL (ref 150–400)
RBC: 3.38 MIL/uL — ABNORMAL LOW (ref 4.22–5.81)
RDW: 19.2 % — ABNORMAL HIGH (ref 11.5–15.5)
WBC: 11.3 10*3/uL — ABNORMAL HIGH (ref 4.0–10.5)
nRBC: 2.9 % — ABNORMAL HIGH (ref 0.0–0.2)

## 2021-01-23 LAB — CREATININE, SERUM
Creatinine, Ser: 0.7 mg/dL (ref 0.61–1.24)
GFR, Estimated: >60 mL/min (ref >60)

## 2021-01-23 LAB — RETICULOCYTES
Immature Retic Fract: 42.4 % — ABNORMAL HIGH (ref 2.3–15.9)
RBC.: 3.57 MIL/uL — ABNORMAL LOW (ref 4.22–5.81)
Retic Count, Absolute: 377 10*3/uL — ABNORMAL HIGH (ref 19.0–186.0)
Retic Ct Pct: 10.3 % — ABNORMAL HIGH (ref 0.4–3.1)

## 2021-01-23 LAB — SARS CORONAVIRUS 2 (TAT 6-24 HRS): SARS Coronavirus 2: NEGATIVE

## 2021-01-23 IMAGING — MR MR ANKLE*R* W/O CM
5 series · 37 of 40 positions shown · non-contrast
Comparison: Right ankle x-rays from yesterday. MRI right ankle
dated [DATE].

CLINICAL DATA: Right ankle ulcer.  Evaluate for osteomyelitis.

EXAM:
MRI OF THE RIGHT ANKLE WITHOUT CONTRAST
TECHNIQUE: Multiplanar, multisequence MR imaging of the ankle was performed. No
intravenous contrast was administered.

[Series 4: PD fat-sat · axial · right · 4.0mm · 0.50mm/px · z∈[-115,+77]mm · 9 of 40 slices shown]
[im 1/40]
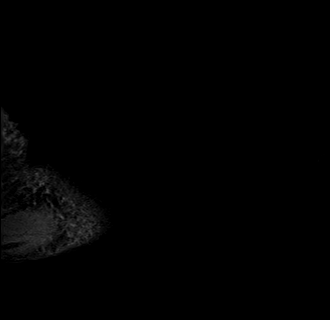
[im 5/40]
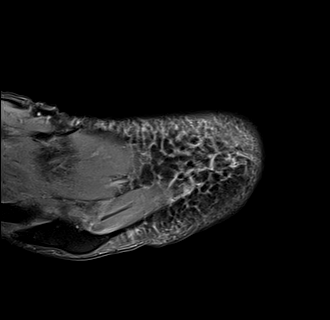
[im 10/40]
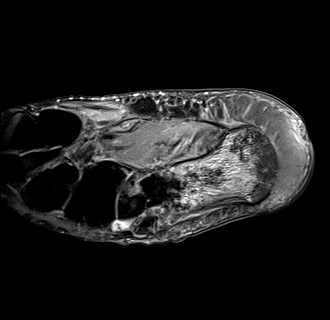
[im 15/40]
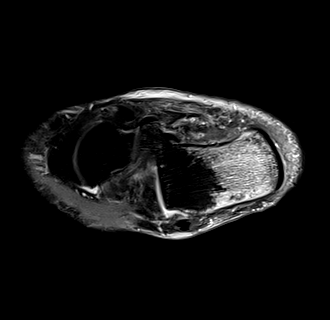
[im 20/40]
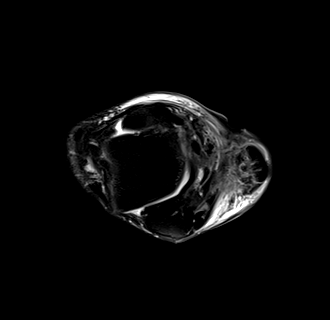
[im 25/40]
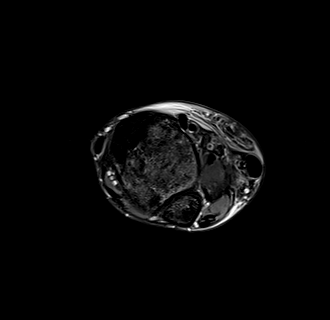
[im 30/40]
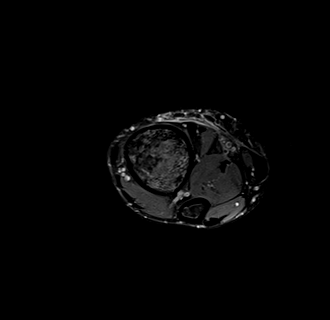
[im 35/40]
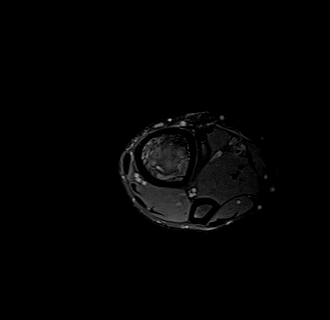
[im 40/40]
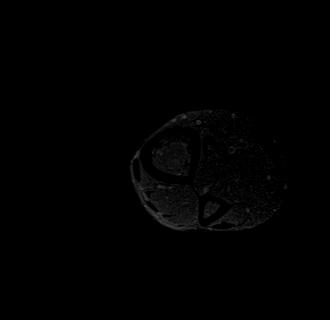

[Series 5: T2 fat-sat · axial · right · 4.0mm · 0.42mm/px · z∈[-115,+77]mm · 9 of 40 slices shown (1 of 2)]
[im 1/40]
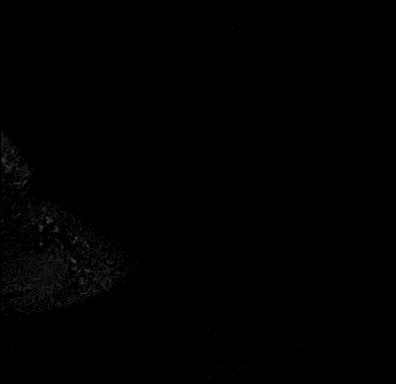
[im 5/40]
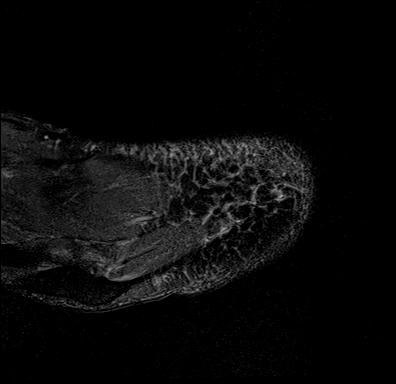
[im 10/40]
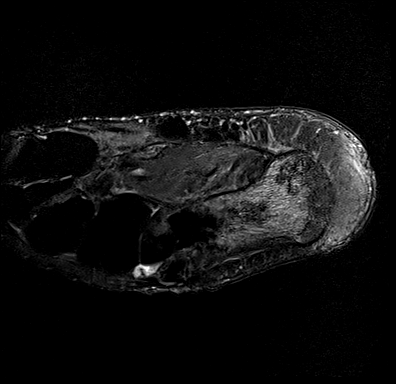
[im 15/40]
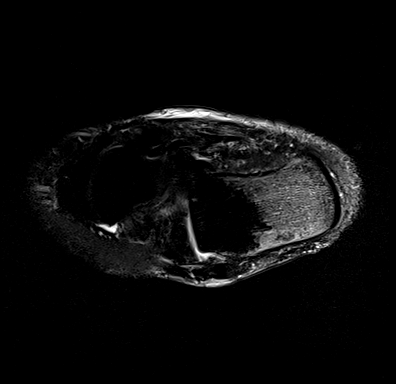
[im 20/40]
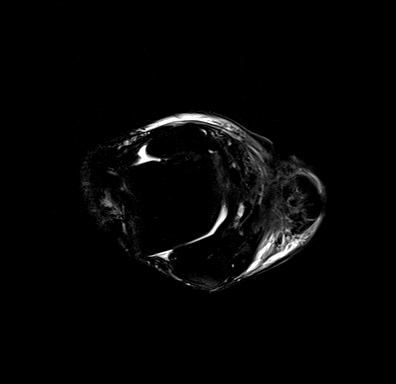
[im 25/40]
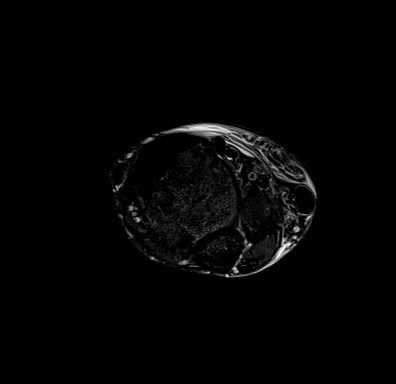
[im 30/40]
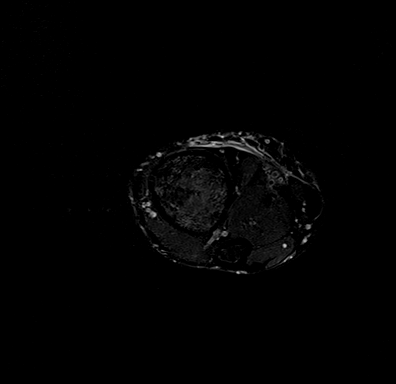
[im 35/40]
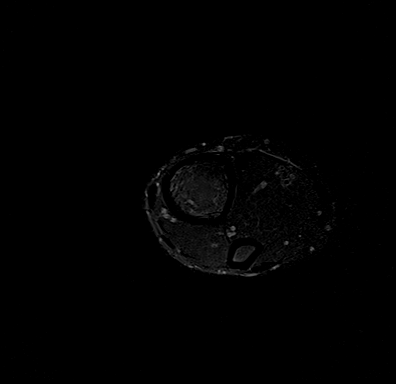
[im 40/40]
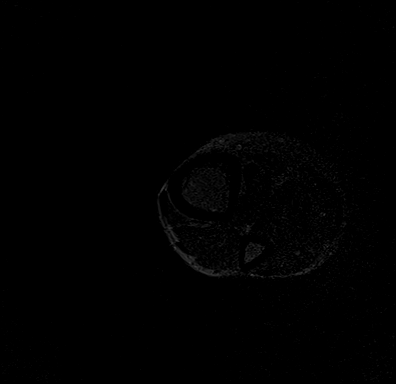

[Series 8: T2 fat-sat · sagittal · right · 3.0mm · 0.56mm/px · 9 of 42 slices shown (2 of 2)]
[im 1/42]
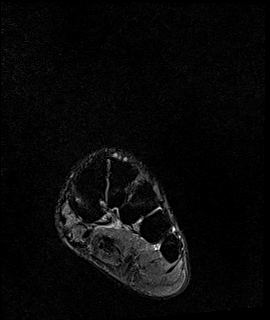
[im 6/42]
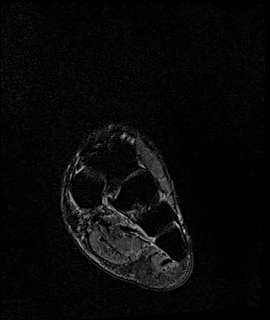
[im 11/42]
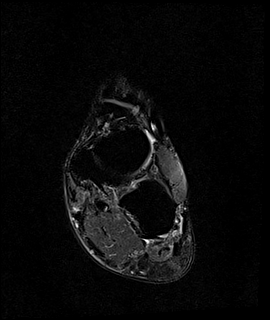
[im 16/42]
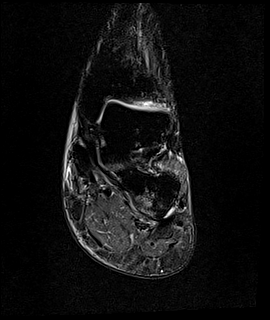
[im 21/42]
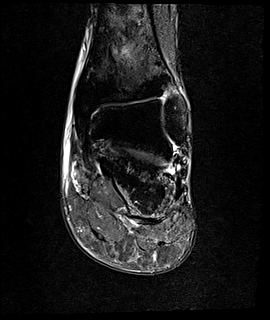
[im 26/42]
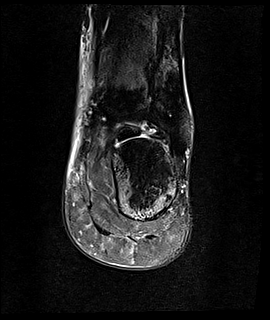
[im 31/42]
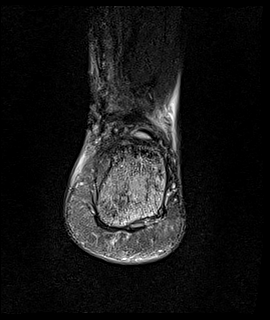
[im 36/42]
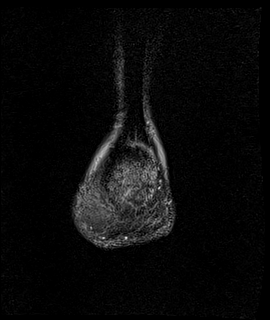
[im 42/42]
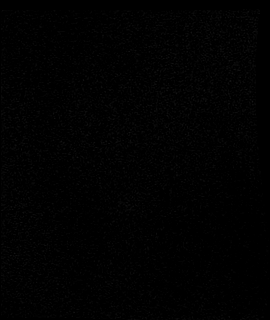

[Series 9: T1 · coronal · right · 3.0mm · 0.62mm/px · 6 of 28 slices shown]
[im 1/28]
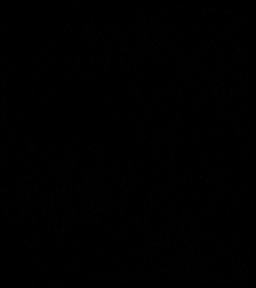
[im 6/28]
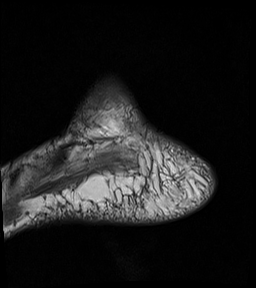
[im 11/28]
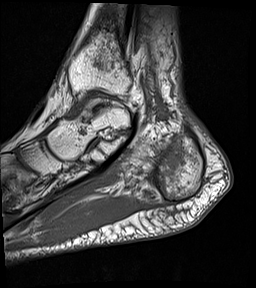
[im 17/28]
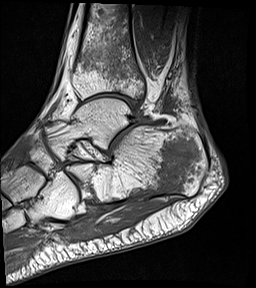
[im 22/28]
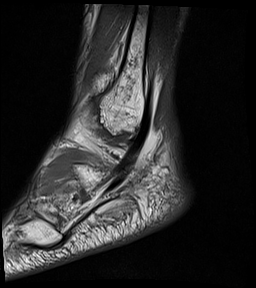
[im 28/28]
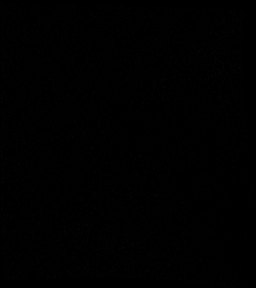

[Series 10: STIR · coronal · right · 3.0mm · 0.31mm/px · 4 of 30 slices shown]
[im 1/30]
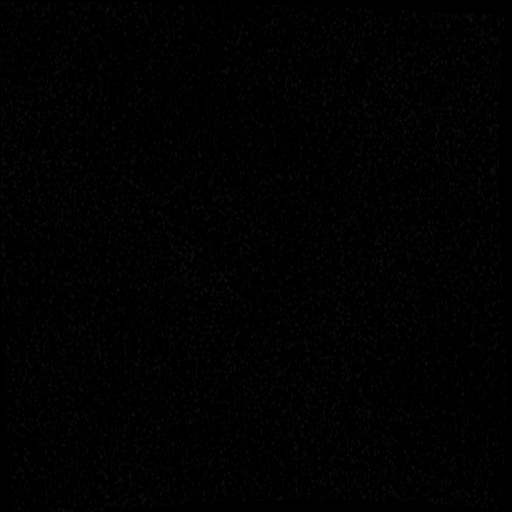
[im 5/30]
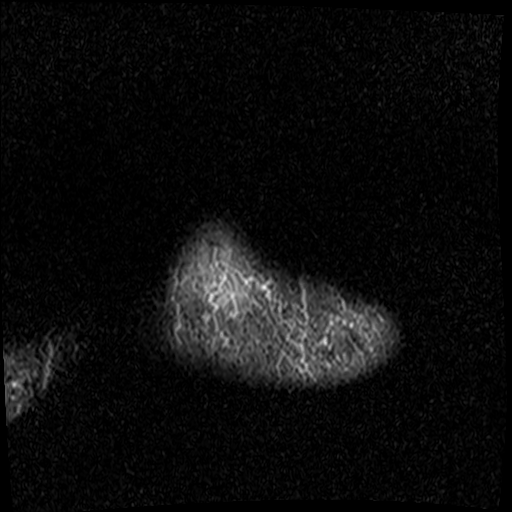
[im 10/30]
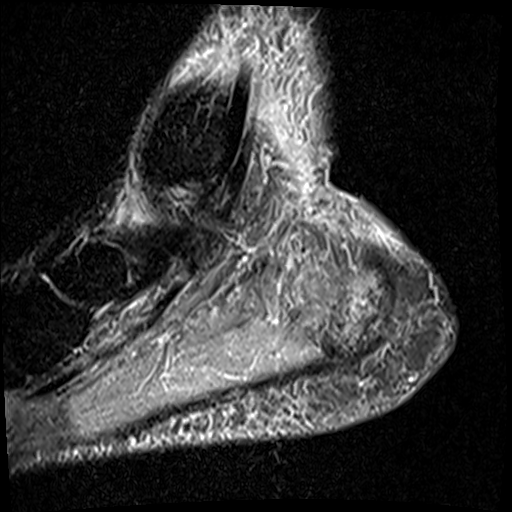
[im 15/30]
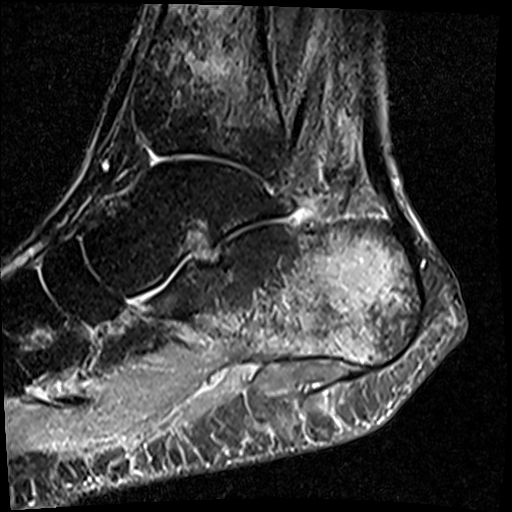

[37 of 40 positions shown; findings below may reference images not displayed]

FINDINGS: TENDONS

Peroneal: Peroneal longus tendon intact. Peroneal brevis intact.

Posteromedial: Posterior tibial tendon intact. Flexor digitorum
longus tendon intact. Flexor hallucis longus tendon intact.

Anterior: Tibialis anterior tendon intact. Extensor hallucis longus
tendon intact Extensor digitorum longus tendon intact.

Achilles:  Intact.

Plantar Fascia: Intact.

LIGAMENTS

Lateral: Unchanged thinning of the anterior talofibular ligament,
consistent with prior injury. Calcaneofibular ligament intact.
Posterior talofibular ligament intact. Anterior and posterior
tibiofibular ligaments intact.

Medial: Deltoid ligament intact. Spring ligament intact.

CARTILAGE

Ankle Joint: No joint effusion. Normal ankle mortise. No chondral
defect.

Subtalar Joints/Sinus Tarsi: Normal subtalar joints. No subtalar
joint effusion. Normal sinus tarsi.

Bones: Unchanged patchy marrow T2 hyperintensity and T1
hypointensity in the distal tibia, fibula, and calcaneus, consistent
with red marrow hyperplasia. No fracture or dislocation.

Soft Tissue: Medial malleolar and posterior hindfoot soft tissue
swelling with small superficial ulcer along the posteromedial ankle
(series 4, image 21). No fluid collection.
IMPRESSION: 1. Medial malleolar and posterior hindfoot soft tissue swelling with
small superficial ulcer along the posteromedial ankle. No evidence
of osteomyelitis or abscess.
2. Unchanged red marrow hyperplasia in the distal tibia, fibula, and
calcaneus.

## 2021-01-23 MED ORDER — SODIUM CHLORIDE 0.45 % IV SOLN: INTRAVENOUS | Status: AC

## 2021-01-23 MED ORDER — DIPHENHYDRAMINE HCL 50 MG/ML IJ SOLN
25.0000 mg | Freq: Two times a day (BID) | INTRAMUSCULAR | Status: DC
  Administered 2021-01-23 – 2021-01-24 (×2): 25 mg via INTRAVENOUS
  Filled 2021-01-23 (×3): qty 1

## 2021-01-23 MED ORDER — SODIUM CHLORIDE 0.9% FLUSH: 9.0000 mL | INTRAVENOUS | Status: DC | PRN

## 2021-01-23 MED ORDER — VANCOMYCIN HCL 1250 MG/250ML IV SOLN
1250.0000 mg | Freq: Two times a day (BID) | INTRAVENOUS | Status: DC
  Administered 2021-01-23 – 2021-01-24 (×2): 1250 mg via INTRAVENOUS
  Filled 2021-01-23 (×2): qty 250

## 2021-01-23 MED ORDER — FERROUS SULFATE 325 (65 FE) MG PO TABS
325.0000 mg | ORAL_TABLET | ORAL | Status: DC
  Administered 2021-01-23 – 2021-01-25 (×2): 325 mg via ORAL
  Filled 2021-01-23 (×2): qty 1

## 2021-01-23 MED ORDER — POLYETHYLENE GLYCOL 3350 17 G PO PACK: 17.0000 g | PACK | Freq: Every day | ORAL | Status: DC | PRN

## 2021-01-23 MED ORDER — LISINOPRIL 5 MG PO TABS
2.5000 mg | ORAL_TABLET | Freq: Every day | ORAL | Status: DC
  Administered 2021-01-23 – 2021-01-25 (×3): 2.5 mg via ORAL
  Filled 2021-01-23 (×3): qty 1

## 2021-01-23 MED ORDER — KETOROLAC TROMETHAMINE 15 MG/ML IJ SOLN
15.0000 mg | Freq: Four times a day (QID) | INTRAMUSCULAR | Status: AC
  Administered 2021-01-23 – 2021-01-24 (×5): 15 mg via INTRAVENOUS
  Filled 2021-01-23 (×5): qty 1

## 2021-01-23 MED ORDER — SENNOSIDES-DOCUSATE SODIUM 8.6-50 MG PO TABS
1.0000 | ORAL_TABLET | Freq: Two times a day (BID) | ORAL | Status: DC
  Administered 2021-01-23 – 2021-01-25 (×5): 1 via ORAL
  Filled 2021-01-23 (×5): qty 1

## 2021-01-23 MED ORDER — PIPERACILLIN-TAZOBACTAM 3.375 G IVPB 30 MIN
3.3750 g | Freq: Once | INTRAVENOUS | Status: AC
  Administered 2021-01-23: 3.375 g via INTRAVENOUS
  Filled 2021-01-23: qty 50

## 2021-01-23 MED ORDER — FOLIC ACID 1 MG PO TABS
1.0000 mg | ORAL_TABLET | Freq: Every day | ORAL | Status: DC
  Administered 2021-01-23 – 2021-01-25 (×3): 1 mg via ORAL
  Filled 2021-01-23 (×3): qty 1

## 2021-01-23 MED ORDER — HYDROMORPHONE 1 MG/ML IV SOLN
INTRAVENOUS | Status: DC
  Administered 2021-01-23: 6 mg via INTRAVENOUS
  Administered 2021-01-23: 0.5 mg via INTRAVENOUS
  Administered 2021-01-23: 1 mg via INTRAVENOUS
  Administered 2021-01-24: 3 mg via INTRAVENOUS
  Filled 2021-01-23: qty 30

## 2021-01-23 MED ORDER — NALOXONE HCL 0.4 MG/ML IJ SOLN: 0.4000 mg | INTRAMUSCULAR | Status: DC | PRN

## 2021-01-23 MED ORDER — DIPHENHYDRAMINE HCL 50 MG/ML IJ SOLN: 25.0000 mg | Freq: Once | INTRAMUSCULAR | Status: DC

## 2021-01-23 MED ORDER — HYDROMORPHONE 1 MG/ML IV SOLN: INTRAVENOUS | Status: DC

## 2021-01-23 MED ORDER — VOXELOTOR 500 MG PO TABS
1500.0000 mg | ORAL_TABLET | Freq: Every day | ORAL | Status: DC
  Administered 2021-01-23 – 2021-01-25 (×3): 1500 mg via ORAL

## 2021-01-23 MED ORDER — SODIUM CHLORIDE 0.9 % IV SOLN
2.0000 g | Freq: Three times a day (TID) | INTRAVENOUS | Status: DC
  Administered 2021-01-23 – 2021-01-25 (×7): 2 g via INTRAVENOUS
  Filled 2021-01-23 (×8): qty 2

## 2021-01-23 MED ORDER — VANCOMYCIN HCL 2000 MG/400ML IV SOLN
2000.0000 mg | Freq: Once | INTRAVENOUS | Status: AC
  Administered 2021-01-23: 2000 mg via INTRAVENOUS
  Filled 2021-01-23: qty 400

## 2021-01-23 MED ORDER — HYDROMORPHONE HCL 1 MG/ML IJ SOLN
1.0000 mg | INTRAMUSCULAR | Status: DC | PRN
  Administered 2021-01-23: 1 mg via INTRAVENOUS
  Filled 2021-01-23: qty 1

## 2021-01-23 MED ORDER — ONDANSETRON HCL 4 MG/2ML IJ SOLN: 4.0000 mg | Freq: Four times a day (QID) | INTRAMUSCULAR | Status: DC | PRN

## 2021-01-23 MED ORDER — SODIUM CHLORIDE 0.45 % IV SOLN: INTRAVENOUS | Status: DC

## 2021-01-23 MED ORDER — METHADONE HCL 10 MG PO TABS
10.0000 mg | ORAL_TABLET | Freq: Three times a day (TID) | ORAL | Status: DC
  Administered 2021-01-23 – 2021-01-25 (×6): 10 mg via ORAL
  Filled 2021-01-23: qty 1
  Filled 2021-01-23: qty 2
  Filled 2021-01-23 (×4): qty 1

## 2021-01-23 MED ORDER — HEPARIN SODIUM (PORCINE) 5000 UNIT/ML IJ SOLN
5000.0000 [IU] | Freq: Three times a day (TID) | INTRAMUSCULAR | Status: DC
  Administered 2021-01-23 – 2021-01-25 (×7): 5000 [IU] via SUBCUTANEOUS
  Filled 2021-01-23 (×7): qty 1

## 2021-01-23 NOTE — ED Notes (Signed)
Vitals late due to IV team attempting IV.

## 2021-01-23 NOTE — Progress Notes (Signed)
Pharmacy Antibiotic Note  Michael Mullins is a 40 y.o. male admitted on 01/22/2021 with wound infection.  Pharmacy has been consulted for Vancomycin and Cefepime dosing.  Plan: Vancomycin 2g IV x 1 then 1250 mg IV Q 12 hrs. Goal AUC 400-550. Expected AUC: 455.3 SCr used: 0.83 Per MD:  Benadryl 25mg  IV premed for Vanc and infuse Vanc over longer duration Cefepime 2gm IV q8h Follow renal function, clinical improvement  Height: 5\' 11"  (180.3 cm) Weight: 99.8 kg (220 lb) IBW/kg (Calculated) : 75.3  Temp (24hrs), Avg:99.1 F (37.3 C), Min:99.1 F (37.3 C), Max:99.1 F (37.3 C)  Recent Labs  Lab 01/19/21 1532 01/19/21 1533 01/22/21 2333 01/23/21 0220 01/23/21 0606  WBC 10.5  --  9.9  --  11.3*  CREATININE 0.69  --  0.83  --   --   LATICACIDVEN  --  1.0 1.2 1.5  --     Estimated Creatinine Clearance: 143.8 mL/min (by C-G formula based on SCr of 0.83 mg/dL).    Allergies  Allergen Reactions   Morphine Hives    Tolerates hydromorphone per MAR   Vancomycin Itching    Tolerates with longer infusion times and premedcation with IV bendryl    Antimicrobials this admission: 8/16 Zosyn x 1 in ED 8/16 Vancomycin >>   8/16 Cefepime >>    Dose adjustments this admission:     Thank you for allowing pharmacy to be a part of this patient's care.  9/16, PharmD 01/23/2021 6:35 AM

## 2021-01-23 NOTE — Progress Notes (Signed)
ABI has been completed.  Results can be found under chart review under CV PROC. 01/23/2021 10:52 AM Zacchary Pompei RVT, RDMS

## 2021-01-23 NOTE — H&P (Signed)
History and Physical    Michael Mullins DQQ:[id number] DOB: Apr 06, 1981 DOA: 01/22/2021  PCP: Aggie Hacker, MD  Patient coming from: Home.  Chief Complaint: Leg pain.  HPI: Michael Mullins is a 40 y.o. male with history of sickle cell anemia and diabetes mellitus presents to the ER for the second time in the last 3 days with worsening pain.  Patient states he has developed an ulcer on the right foot middle aspect of the ankle over the last few days with some discharge.  Was prescribed clindamycin he took for 1 day but started having worsening pain of the right lower typical of his sickle cell pain crisis.  Denies any chest pain or shortness of breath.  ED Course: In the ER patient's x-ray shows no bony involvement of the right foot.  On exam patient has a deep ulcer on the right foot on the medial aspect I do not see any active discharge.  Labs show normal lactic acid with a hemoglobin of 12.9.  Patient is admitted for sickle cell pain crisis and further management of his foot ulceration.  COVID test is pending.  Review of Systems: As per HPI, rest all negative.   Past Medical History:  Diagnosis Date   Diabetes mellitus without complication (HCC)    Sickle cell anemia (HCC)     History reviewed. No pertinent surgical history.   reports that he has never smoked. He has never used smokeless tobacco. No history on file for alcohol use and drug use.  Allergies  Allergen Reactions   Morphine Hives    Tolerates hydromorphone per MAR   Vancomycin Itching    Tolerates with longer infusion times and premedcation with IV bendryl    History reviewed. No pertinent family history.  Prior to Admission medications   Medication Sig Start Date End Date Taking? Authorizing Provider  CLARAVIS 40 MG capsule Take 1 capsule (40 mg total) by mouth 2 times daily. 11/28/20  Yes   clindamycin (CLEOCIN) 300 MG capsule Take 1 capsule (300 mg total) by mouth 3 (three) times daily. 01/19/21  Yes  Charlynne Pander, MD  empagliflozin (JARDIANCE) 10 MG TABS tablet Take 1 tablet (10 mg total) by mouth daily. 11/03/20  Yes   ergocalciferol (VITAMIN D2) 1.25 MG (50000 UT) capsule Take 1 capsule by mouth every 30 days 08/07/20  Yes   ferrous sulfate (FEROSUL) 325 (65 FE) MG tablet Take by mouth. Patient taking differently: Take 325 mg by mouth every other day. 08/07/20  Yes   folic acid (FOLVITE) 1 MG tablet Take 1 tablet by mouth daily 08/07/20  Yes   lisinopril (ZESTRIL) 2.5 MG tablet Take 1 tablet (2.5 mg total) by mouth daily. 11/03/20  Yes   methadone (DOLOPHINE) 10 MG tablet Take 3 tablets by mouth 3 times daily. 01/01/21  Yes   naloxone (NARCAN) nasal spray 4 mg/0.1 mL Use as directed every 2-3 minutes until emergency arrives, call 911 08/07/20  Yes   OXBRYTA 500 MG TABS tablet Take 1,500 mg by mouth daily. 01/01/21  Yes [provider]  oxycodone (ROXICODONE) 30 MG immediate release tablet Take 1 tablet by mouth every 6 hours as needed for Pain. 01/01/21  Yes   triamcinolone cream (KENALOG) 0.1 % Apply to affected skin of arms twice daily as needed. NEVER to face/neck/groin. Patient not taking: No sig reported 11/28/20       Physical Exam: Constitutional: Moderately built and nourished. Vitals:   01/23/21 0411 01/23/21 0430 01/23/21  0445 01/23/21 0500  BP: 104/75 114/65    Pulse: 68 85 72   Resp: 13 17 12    Temp:      TempSrc:      SpO2: 96% 91% 95%   Weight:    99.8 kg   Eyes: Anicteric no pallor. ENMT: No discharge from the ears eyes nose and mouth. Neck: No mass felt.  No neck rigidity. Respiratory: No rhonchi or crepitations. Cardiovascular: S1-S2 heard. Abdomen: Soft nontender bowel sound present. Musculoskeletal: Ulceration on the medial aspect of his right ankle. Skin: Ulcer on the medial aspect of the right ankle. Neurologic: Alert awake oriented to time place and person.  Moves all extremities. Psychiatric: Appears normal.  Normal affect.   Labs on Admission:  I have personally reviewed following labs and imaging studies  CBC: Recent Labs  Lab 01/19/21 1532 01/22/21 2333  WBC 10.5 9.9  NEUTROABS 4.3 4.4  HGB 11.3* 12.9*  HCT 31.6* 36.2*  MCV 95.8 96.0  PLT 358 332   Basic Metabolic Panel: Recent Labs  Lab 01/19/21 1532 01/22/21 2333  NA 139 137  K 4.3 3.8  CL 106 103  CO2 26 27  GLUCOSE 115* 126*  BUN 8 7  CREATININE 0.69 0.83  CALCIUM 9.2 9.5   GFR: CrCl cannot be calculated (Unknown ideal weight.). Liver Function Tests: Recent Labs  Lab 01/19/21 1532 01/22/21 2333  AST 48* 42*  ALT 24 25  ALKPHOS 57 66  BILITOT 3.0* 3.5*  PROT 8.1 9.0*  ALBUMIN 4.3 4.8   No results for input(s): LIPASE, AMYLASE in the last 168 hours. No results for input(s): AMMONIA in the last 168 hours. Coagulation Profile: No results for input(s): INR, PROTIME in the last 168 hours. Cardiac Enzymes: No results for input(s): CKTOTAL, CKMB, CKMBINDEX, TROPONINI in the last 168 hours. BNP (last 3 results) No results for input(s): PROBNP in the last 8760 hours. HbA1C: No results for input(s): HGBA1C in the last 72 hours. CBG: Recent Labs  Lab 01/19/21 1713 01/22/21 2328  GLUCAP 110* 112*   Lipid Profile: No results for input(s): CHOL, HDL, LDLCALC, TRIG, CHOLHDL, LDLDIRECT in the last 72 hours. Thyroid Function Tests: No results for input(s): TSH, T4TOTAL, FREET4, T3FREE, THYROIDAB in the last 72 hours. Anemia Panel: Recent Labs    01/23/21 0220  RETICCTPCT 10.3*   Urine analysis: No results found for: COLORURINE, APPEARANCEUR, LABSPEC, PHURINE, GLUCOSEU, HGBUR, BILIRUBINUR, KETONESUR, PROTEINUR, UROBILINOGEN, NITRITE, LEUKOCYTESUR Sepsis Labs: @LABRCNTIP (procalcitonin:4,lacticidven:4) )No results found for this or any previous visit (from the past 240 hour(s)).   Radiological Exams on Admission: DG Ankle Complete Right  Result Date: 01/22/2021 CLINICAL DATA:  Sickle cell disease EXAM: RIGHT ANKLE - COMPLETE 3+ VIEW COMPARISON:   None. FINDINGS: No fracture or malalignment. Apparent ulcer at the posterior ankle. No periostitis or bone destruction. IMPRESSION: No acute osseous abnormality Electronically Signed   By: M.D.   On: 01/22/2021 22:59      Assessment/Plan Principal Problem:   Sickle cell pain crisis (HCC) Active Problems:   Ankle ulcer (HCC)   Controlled type 2 diabetes mellitus with hyperglycemia (HCC)    Sickle cell pain crisis -I have placed patient on weight-based Dilaudid PCA and continue his home dose of methadone after discussing with pharmacy confirming the dose.  Patient is also on Oxbryta which will be continued.  Toradol.  IV fluids.  Will check ABI. Right foot ulcer for which we will be getting MRI.  On empiric antibiotics.  Check ABI. Diabetes  mellitus type 2 we will keep patient on sliding scale coverage.  Patient states his last hemoglobin A1c was around 7. Anemia follow CBC. History of acne on isotretinoin. Patient states he takes lisinopril for kidney protection.  Since patient has sickle cell pain crisis and foot ulceration will need close monitoring for any further worsening inpatient status.   DVT prophylaxis: Heparin. Code Status: Full code. Family Communication: Discussed with patient. Disposition Plan: Home. Consults called: None. Admission status: Inpatient.   Eduard Clos MD [facility] Pager [phone].  If 7PM-7AM, please contact night-coverage [email or web address] Password [email or web address]  01/23/2021, 5:25 AM

## 2021-01-23 NOTE — Plan of Care (Signed)
  Problem: Education: Goal: Knowledge of medication regimen will be met for pain relief regimen by discharge Outcome: Progressing

## 2021-01-23 NOTE — ED Notes (Signed)
Pt out of room, on the way to MRI via MRI transport.

## 2021-01-23 NOTE — ED Provider Notes (Signed)
[facility]-EMERGENCY DEPT Provider Note   CSN: [id number] Arrival date & time: 01/22/21  2140     History Chief Complaint  Patient presents with   Sickle Cell Pain Crisis    Michael Mullins is a 40 y.o. male presenting for evaluation of sickle cell pain and wound to the right ankle.  Patient states he is continuing to have severe pain of his right lower extremity and a wound of his right medial ankle.  This is worsening despite being on clindamycin.  He has been taking his home pain medications without improvement of symptoms.  He reports chills, no fevers.  No chest pain or shortness of breath.  He is not checking his blood sugars at home.  No pain on the left side.  No fall, trauma, or injury.  HPI     Past Medical History:  Diagnosis Date   Diabetes mellitus without complication (HCC)    Sickle cell anemia (HCC)     Patient Active Problem List   Diagnosis Date Noted   Sickle cell pain crisis (HCC) 01/23/2021   FOOT ULCER 01/14/2009   CHEST PAIN 01/14/2009    History reviewed. No pertinent surgical history.     History reviewed. No pertinent family history.  Social History   Tobacco Use   Smoking status: Never   Smokeless tobacco: Never    Home Medications Prior to Admission medications   Medication Sig Start Date End Date Taking? Authorizing Provider  CLARAVIS 40 MG capsule Take 1 capsule (40 mg total) by mouth 2 times daily. 11/28/20  Yes   clindamycin (CLEOCIN) 300 MG capsule Take 1 capsule (300 mg total) by mouth 3 (three) times daily. 01/19/21  Yes Charlynne Pander, MD  empagliflozin (JARDIANCE) 10 MG TABS tablet Take 1 tablet (10 mg total) by mouth daily. 11/03/20  Yes   ergocalciferol (VITAMIN D2) 1.25 MG (50000 UT) capsule Take 1 capsule by mouth every 30 days 08/07/20  Yes   ferrous sulfate (FEROSUL) 325 (65 FE) MG tablet Take by mouth. Patient taking differently: Take 325 mg by mouth every other day. 08/07/20  Yes   folic acid  (FOLVITE) 1 MG tablet Take 1 tablet by mouth daily 08/07/20  Yes   lisinopril (ZESTRIL) 2.5 MG tablet Take 1 tablet (2.5 mg total) by mouth daily. 11/03/20  Yes   methadone (DOLOPHINE) 10 MG tablet Take 3 tablets by mouth 3 times daily. 01/01/21  Yes   naloxone (NARCAN) nasal spray 4 mg/0.1 mL Use as directed every 2-3 minutes until emergency arrives, call 911 08/07/20  Yes   OXBRYTA 500 MG TABS tablet Take 1,500 mg by mouth daily. 01/01/21  Yes [provider]  oxycodone (ROXICODONE) 30 MG immediate release tablet Take 1 tablet by mouth every 6 hours as needed for Pain. 01/01/21  Yes   triamcinolone cream (KENALOG) 0.1 % Apply to affected skin of arms twice daily as needed. NEVER to face/neck/groin. Patient not taking: No sig reported 11/28/20       Allergies    Morphine and Vancomycin  Review of Systems   Review of Systems  Constitutional:  Positive for chills.  Musculoskeletal:  Positive for myalgias.  Skin:  Positive for wound.  All other systems reviewed and are negative.  Physical Exam Updated Vital Signs BP 119/62 (BP Location: Right Arm)   Pulse 67   Temp 99.1 F (37.3 C) (Oral)   Resp 18   SpO2 96%   Physical Exam Vitals and nursing note  reviewed.  Constitutional:      General: He is not in acute distress.    Appearance: Normal appearance.     Comments: nontoxic  HENT:     Head: Normocephalic and atraumatic.  Eyes:     Conjunctiva/sclera: Conjunctivae normal.     Pupils: Pupils are equal, round, and reactive to light.  Cardiovascular:     Rate and Rhythm: Normal rate and regular rhythm.     Pulses: Normal pulses.  Pulmonary:     Effort: Pulmonary effort is normal. No respiratory distress.     Breath sounds: Normal breath sounds. No wheezing.     Comments: Speaking in full sentences.  Clear lung sounds in all fields. Abdominal:     General: There is no distension.     Palpations: Abdomen is soft.     Tenderness: There is no abdominal tenderness.   Musculoskeletal:        General: Normal range of motion.     Cervical back: Normal range of motion and neck supple.     Comments: Purulent draining wound of the right medial ankle without surrounding erythema or induration.  Diffuse tenderness palpation of the right lower extremity.  Skin:    General: Skin is warm and dry.     Capillary Refill: Capillary refill takes less than 2 seconds.  Neurological:     Mental Status: He is alert and oriented to person, place, and time.  Psychiatric:        Mood and Affect: Mood and affect normal.        Speech: Speech normal.        Behavior: Behavior normal.      ED Results / Procedures / Treatments   Labs (all labs ordered are listed, but only abnormal results are displayed) Labs Reviewed  CBC WITH DIFFERENTIAL/PLATELET - Abnormal; Notable for the following components:      Result Value   RBC 3.77 (*)    Hemoglobin 12.9 (*)    HCT 36.2 (*)    MCH 34.2 (*)    RDW 19.9 (*)    nRBC 3.7 (*)    Monocytes Absolute 1.7 (*)    All other components within normal limits  COMPREHENSIVE METABOLIC PANEL - Abnormal; Notable for the following components:   Glucose, Bld 126 (*)    Total Protein 9.0 (*)    AST 42 (*)    Total Bilirubin 3.5 (*)    All other components within normal limits  RETICULOCYTES - Abnormal; Notable for the following components:   Retic Ct Pct 10.3 (*)    RBC. 3.57 (*)    Retic Count, Absolute 377.0 (*)    Immature Retic Fract 42.4 (*)    All other components within normal limits  CBG MONITORING, ED - Abnormal; Notable for the following components:   Glucose-Capillary 112 (*)    All other components within normal limits  SARS CORONAVIRUS 2 (TAT 6-24 HRS)  LACTIC ACID, PLASMA  LACTIC ACID, PLASMA    EKG None  Radiology DG Ankle Complete Right  Result Date: 01/22/2021 CLINICAL DATA:  Sickle cell disease EXAM: RIGHT ANKLE - COMPLETE 3+ VIEW COMPARISON:  None. FINDINGS: No fracture or malalignment. Apparent ulcer at  the posterior ankle. No periostitis or bone destruction. IMPRESSION: No acute osseous abnormality Electronically Signed   By: Jasmine Pang M.D.   On: 01/22/2021 22:59    Procedures Procedures   Medications Ordered in ED Medications  0.45 % sodium chloride infusion (has no administration in  time range)  piperacillin-tazobactam (ZOSYN) IVPB 3.375 g (has no administration in time range)  HYDROmorphone (DILAUDID) injection 2 mg (2 mg Intravenous Given 01/23/21 0218)  diphenhydrAMINE (BENADRYL) capsule 50 mg (50 mg Oral Given 01/23/21 0043)    ED Course  I have reviewed the triage vital signs and the nursing notes.  Pertinent labs & imaging results that were available during my care of the patient were reviewed by me and considered in my medical decision making (see chart for details).    MDM Rules/Calculators/A&P                           Patient presenting for evaluation of worsening wound of his right ankle and continued to sickle cell pain in his right lower extremity.  On exam, patient peers nontoxic.  He is not septic and vital signs are reassuring.  However on my evaluation, wound is worse than it was when I saw him a few days ago.  Will repeat labs and x-ray.  As patient has worsening infection in the setting of diabetes and being on antibiotics, he will likely need to be admitted for infection and pain control.  Labs interpreted by me, overall reassuring.  No leukocytosis.  Lactic is negative.  X-ray viewed and independently interpreted by me, no obvious signs of osteo.  Discussed findings with patient.  Discussed my concerns about his ability to treat this wound outpatient.  He states he has need to be admitted previously for similar wounds.  We will call for admission.  Discussed with Dr. Toniann Fail from [facility] hospitalist service, patient to be admitted  Final Clinical Impression(s) / ED Diagnoses Final diagnoses:  Ankle wound, right, initial encounter  Sickle cell pain crisis  [facility])    Rx / DC Orders ED Discharge Orders     None        Alveria Apley, PA-C 01/23/21 0254    Mesner, Barbara Cower, MD 01/23/21 [phone]

## 2021-01-23 NOTE — ED Notes (Signed)
IV team RN at bedside.  

## 2021-01-23 NOTE — ED Notes (Signed)
One failed attempt to start piv.

## 2021-01-23 NOTE — ED Notes (Signed)
Second RN attempted to insert piv, unsuccessful attempt.

## 2021-01-24 DIAGNOSIS — D5700 Hb-SS disease with crisis, unspecified: Secondary | ICD-10-CM | POA: Diagnosis not present

## 2021-01-24 LAB — CBC
HCT: 30.7 % — ABNORMAL LOW (ref 39.0–52.0)
Hemoglobin: 11.1 g/dL — ABNORMAL LOW (ref 13.0–17.0)
MCH: 33.9 pg (ref 26.0–34.0)
MCHC: 36.2 g/dL — ABNORMAL HIGH (ref 30.0–36.0)
MCV: 93.9 fL (ref 80.0–100.0)
Platelets: 298 10*3/uL (ref 150–400)
RBC: 3.27 MIL/uL — ABNORMAL LOW (ref 4.22–5.81)
RDW: 18.6 % — ABNORMAL HIGH (ref 11.5–15.5)
WBC: 9.9 10*3/uL (ref 4.0–10.5)
nRBC: 2.5 % — ABNORMAL HIGH (ref 0.0–0.2)

## 2021-01-24 LAB — COMPREHENSIVE METABOLIC PANEL
ALT: 26 U/L (ref 0–44)
AST: 42 U/L — ABNORMAL HIGH (ref 15–41)
Albumin: 3.9 g/dL (ref 3.5–5.0)
Alkaline Phosphatase: 51 U/L (ref 38–126)
Anion gap: 7 (ref 5–15)
BUN: 9 mg/dL (ref 6–20)
CO2: 24 mmol/L (ref 22–32)
Calcium: 8.7 mg/dL — ABNORMAL LOW (ref 8.9–10.3)
Chloride: 106 mmol/L (ref 98–111)
Creatinine, Ser: 0.66 mg/dL (ref 0.61–1.24)
GFR, Estimated: >60 mL/min (ref >60)
Glucose, Bld: 83 mg/dL (ref 70–99)
Potassium: 4.3 mmol/L (ref 3.5–5.1)
Sodium: 137 mmol/L (ref 135–145)
Total Bilirubin: 3 mg/dL — ABNORMAL HIGH (ref 0.3–1.2)
Total Protein: 7.4 g/dL (ref 6.5–8.1)

## 2021-01-24 LAB — GLUCOSE, CAPILLARY
Glucose-Capillary: 77 mg/dL (ref 70–99)
Glucose-Capillary: 120 mg/dL — ABNORMAL HIGH (ref 70–99)

## 2021-01-24 MED ORDER — KETOROLAC TROMETHAMINE 15 MG/ML IJ SOLN
15.0000 mg | Freq: Four times a day (QID) | INTRAMUSCULAR | Status: DC
  Administered 2021-01-24 – 2021-01-25 (×5): 15 mg via INTRAVENOUS
  Filled 2021-01-24 (×5): qty 1

## 2021-01-24 MED ORDER — COLLAGENASE 250 UNIT/GM EX OINT
TOPICAL_OINTMENT | Freq: Every day | CUTANEOUS | Status: DC
  Filled 2021-01-24: qty 30

## 2021-01-24 MED ORDER — OXYCODONE HCL 5 MG PO TABS: 30.0000 mg | ORAL_TABLET | Freq: Four times a day (QID) | ORAL | Status: DC | PRN

## 2021-01-24 MED ORDER — DIPHENHYDRAMINE HCL 25 MG PO CAPS
25.0000 mg | ORAL_CAPSULE | Freq: Once | ORAL | Status: AC
  Administered 2021-01-24: 25 mg via ORAL
  Filled 2021-01-24: qty 1

## 2021-01-24 MED ORDER — INSULIN ASPART 100 UNIT/ML IJ SOLN
0.0000 [IU] | Freq: Three times a day (TID) | INTRAMUSCULAR | Status: DC
Start: 2021-01-24 — End: 2021-01-25

## 2021-01-24 MED ORDER — HYDROMORPHONE 1 MG/ML IV SOLN
INTRAVENOUS | Status: DC
Start: 2021-01-24 — End: 2021-01-25
  Administered 2021-01-24: 0.5 mg via INTRAVENOUS
  Administered 2021-01-24: 2.5 mg via INTRAVENOUS
  Administered 2021-01-25: 1 mg via INTRAVENOUS

## 2021-01-24 NOTE — Progress Notes (Signed)
Subjective: Michael Mullins is a 40 year old male with a medical history significant for sickle cell disease type SS, chronic pain syndrome, functional asplenia, vitamin D deficiency, ulcer to ankle, and type 2 diabetes mellitus that was admitted for right ankle ulcer infection and the setting of sickle cell pain crisis. Patient states that pain intensity is 7/10 primarily to right ankle.  Patient states that he is unable to bear weight.  He characterizes pain as constant and throbbing.  He denies headache, fever, chills, chest pain, shortness of breath, urinary symptoms, nausea, vomiting, or diarrhea.  Objective:  Vital signs in last 24 hours:  Vitals:   01/23/21 2347 01/24/21 0123 01/24/21 0515 01/24/21 1029  BP:  (!) 102/51 (!) 95/50 112/60  Pulse:  68 (!) 58 61  Resp:  14 14 16   Temp:  98 F (36.7 C) 97.7 F (36.5 C) 97.6 F (36.4 C)  TempSrc:  Oral Oral Oral  SpO2: 95% 96% 98% 97%  Weight:      Height:        Intake/Output from previous day:   Intake/Output Summary (Last 24 hours) at 01/24/2021 1140 Last data filed at 01/24/2021 1029 Gross per 24 hour  Intake 4348 ml  Output 2000 ml  Net 2348 ml    Physical Exam: General: Alert, awake, oriented x3, in no acute distress.  HEENT: [place]/AT PEERL, EOMI Neck: Trachea midline,  no masses, no thyromegal,y no JVD, no carotid bruit OROPHARYNX:  Moist, No exudate/ erythema/lesions.  Heart: Regular rate and rhythm, without murmurs, rubs, gallops, PMI non-displaced, no heaves or thrills on palpation.  Lungs: Clear to auscultation, no wheezing or rhonchi noted. No increased vocal fremitus resonant to percussion  Abdomen: Soft, nontender, nondistended, positive bowel sounds, no masses no hepatosplenomegaly noted..  Neuro: No focal neurological deficits noted cranial nerves II through XII grossly intact. DTRs 2+ bilaterally upper and lower extremities. Strength 5 out of 5 in bilateral upper and lower extremities. Musculoskeletal: No warm  swelling or erythema around joints, no spinal tenderness noted. Psychiatric: Patient alert and oriented x3, good insight and cognition, good recent to remote recall. Lymph node survey: No cervical axillary or inguinal lymphadenopathy noted. Skin: Right ankle dressing clean, dry, and intact.  Lab Results:  Basic Metabolic Panel:    Component Value Date/Time   NA 137 01/24/2021 0826   K 4.3 01/24/2021 0826   CL 106 01/24/2021 0826   CO2 24 01/24/2021 0826   BUN 9 01/24/2021 0826   CREATININE 0.66 01/24/2021 0826   GLUCOSE 83 01/24/2021 0826   CALCIUM 8.7 (L) 01/24/2021 0826   CBC:    Component Value Date/Time   WBC 9.9 01/24/2021 0826   HGB 11.1 (L) 01/24/2021 0826   HCT 30.7 (L) 01/24/2021 0826   PLT 298 01/24/2021 0826   MCV 93.9 01/24/2021 0826   NEUTROABS 4.4 01/22/2021 2333   LYMPHSABS 3.6 01/22/2021 2333   MONOABS 1.7 (H) 01/22/2021 2333   EOSABS 0.1 01/22/2021 2333   BASOSABS 0.0 01/22/2021 2333    Recent Results (from the past 240 hour(s))  SARS CORONAVIRUS 2 (TAT 6-24 HRS) Nasopharyngeal Nasopharyngeal Swab     Status: None   Collection Time: 01/23/21  3:33 AM   Specimen: Nasopharyngeal Swab  Result Value Ref Range Status   SARS Coronavirus 2 NEGATIVE NEGATIVE Final    Comment: (NOTE) SARS-CoV-2 target nucleic acids are NOT DETECTED.  The SARS-CoV-2 RNA is generally detectable in upper and lower respiratory specimens during the acute phase of infection. Negative results  do not preclude SARS-CoV-2 infection, do not rule out co-infections with other pathogens, and should not be used as the sole basis for treatment or other patient management decisions. Negative results must be combined with clinical observations, patient history, and epidemiological information. The expected result is Negative.  Fact Sheet for Patients: HairSlick.no  Fact Sheet for Healthcare Providers: quierodirigir.com  This test is  not yet approved or cleared by the [place] FDA and  has been authorized for detection and/or diagnosis of SARS-CoV-2 by FDA under an Emergency Use Authorization (EUA). This EUA will remain  in effect (meaning this test can be used) for the duration of the COVID-19 declaration under Se ction 564(b)(1) of the Act, 21 U.S.C. section 360bbb-3(b)(1), unless the authorization is terminated or revoked sooner.  Performed at [facility], [address], [place], [place] [address]     Studies/Results: DG Ankle Complete Right  Result Date: 01/22/2021 CLINICAL DATA:  Sickle cell disease EXAM: RIGHT ANKLE - COMPLETE 3+ VIEW COMPARISON:  None. FINDINGS: No fracture or malalignment. Apparent ulcer at the posterior ankle. No periostitis or bone destruction. IMPRESSION: No acute osseous abnormality Electronically Signed   By: Jasmine Pang M.D.   On: 01/22/2021 22:59   MR ANKLE RIGHT WO CONTRAST  Result Date: 01/23/2021 CLINICAL DATA:  Right ankle ulcer.  Evaluate for osteomyelitis. EXAM: MRI OF THE RIGHT ANKLE WITHOUT CONTRAST TECHNIQUE: Multiplanar, multisequence MR imaging of the ankle was performed. No intravenous contrast was administered. COMPARISON:  Right ankle x-rays from yesterday. MRI right ankle dated January 07, 2016. FINDINGS: TENDONS Peroneal: Peroneal longus tendon intact. Peroneal brevis intact. Posteromedial: Posterior tibial tendon intact. Flexor digitorum longus tendon intact. Flexor hallucis longus tendon intact. Anterior: Tibialis anterior tendon intact. Extensor hallucis longus tendon intact Extensor digitorum longus tendon intact. Achilles:  Intact. Plantar Fascia: Intact. LIGAMENTS Lateral: Unchanged thinning of the anterior talofibular ligament, consistent with prior injury. Calcaneofibular ligament intact. Posterior talofibular ligament intact. Anterior and posterior tibiofibular ligaments intact. Medial: Deltoid ligament intact. Spring ligament intact. CARTILAGE Ankle Joint:  No joint effusion. Normal ankle mortise. No chondral defect. Subtalar Joints/Sinus Tarsi: Normal subtalar joints. No subtalar joint effusion. Normal sinus tarsi. Bones: Unchanged patchy marrow T2 hyperintensity and T1 hypointensity in the distal tibia, fibula, and calcaneus, consistent with red marrow hyperplasia. No fracture or dislocation. Soft Tissue: Medial malleolar and posterior hindfoot soft tissue swelling with small superficial ulcer along the posteromedial ankle (series 4, image 21). No fluid collection. IMPRESSION: 1. Medial malleolar and posterior hindfoot soft tissue swelling with small superficial ulcer along the posteromedial ankle. No evidence of osteomyelitis or abscess. 2. Unchanged red marrow hyperplasia in the distal tibia, fibula, and calcaneus. Electronically Signed   By: Obie Dredge M.D.   On: 01/23/2021 07:21   VAS [place] ABI WITH/WO TBI  Result Date: 01/23/2021  LOWER EXTREMITY DOPPLER STUDY Patient Name:  MESHILEM MACHUCA  Date of Exam:   01/23/2021 Medical Rec #: [id number]       Accession #:    [id number] Date of Birth: 18-Jan-1981       Patient Gender: M Patient Age:   31 years Exam Location:  [facility] Procedure:      VAS [place] ABI WITH/WO TBI Referring Phys: Midge Minium --------------------------------------------------------------------------------  Indications: Ulceration, and Bilateral foot pain (PT in Sickle Cell crisis). High Risk Factors: Diabetes, no history of smoking.  Comparison Study: No previous exams Performing Technologist: Hill, Jody RVT, RDMS  Examination Guidelines: A complete evaluation includes at  minimum, Doppler waveform signals and systolic blood pressure reading at the level of bilateral brachial, anterior tibial, and posterior tibial arteries, when vessel segments are accessible. Bilateral testing is considered an integral part of a complete examination. Photoelectric Plethysmograph (PPG) waveforms and toe systolic pressure readings are included as  required and additional duplex testing as needed. Limited examinations for reoccurring indications may be performed as noted.  ABI Findings: +---------+------------------+-----+---------+--------+ Right    Rt Pressure (mmHg)IndexWaveform Comment  +---------+------------------+-----+---------+--------+ Brachial 113                    triphasic         +---------+------------------+-----+---------+--------+ PTA      126               1.11 triphasic         +---------+------------------+-----+---------+--------+ DP       101               0.89 biphasic          +---------+------------------+-----+---------+--------+ Great Toe42                0.37 Abnormal          +---------+------------------+-----+---------+--------+ +---------+------------------+-----+---------+-------+ Left     Lt Pressure (mmHg)IndexWaveform Comment +---------+------------------+-----+---------+-------+ Brachial 114                    triphasic        +---------+------------------+-----+---------+-------+ PTA      130               1.14 triphasic        +---------+------------------+-----+---------+-------+ DP       131               1.15 triphasic        +---------+------------------+-----+---------+-------+ Great Toe65                0.57 Abnormal         +---------+------------------+-----+---------+-------+ +-------+-----------+-----------+------------+------------+ ABI/TBIToday's ABIToday's TBIPrevious ABIPrevious TBI +-------+-----------+-----------+------------+------------+ Right  1.11       0.37                                +-------+-----------+-----------+------------+------------+ Left   1.15       0.57                                +-------+-----------+-----------+------------+------------+  Summary: Right: Resting right ankle-brachial index is within normal range. No evidence of significant right lower extremity arterial disease. The right toe-brachial  index is abnormal (moderate/severe small vessel disease). Left: Resting left ankle-brachial index is within normal range. No evidence of significant left lower extremity arterial disease. The left toe-brachial index is abnormal (moderate small vessel disease).  *See table(s) above for measurements and observations.  Electronically signed by Waverly Ferrari MD on 01/23/2021 at 3:28:07 PM.    Final     Medications: Scheduled Meds:  collagenase   Topical Daily   diphenhydrAMINE  25 mg Intravenous Q12H   ferrous sulfate  325 mg Oral QODAY   folic acid  1 mg Oral Daily   heparin  5,000 Units Subcutaneous Q8H   HYDROmorphone   Intravenous Q4H   lisinopril  2.5 mg Oral Daily   methadone  10 mg Oral TID   senna-docusate  1 tablet Oral BID   voxelotor  1,500 mg Oral Daily  Continuous Infusions:  ceFEPime (MAXIPIME) IV 2 g (01/24/21 1029)   vancomycin 1,250 mg (01/24/21 1032)   PRN Meds:.naloxone **AND** sodium chloride flush, ondansetron (ZOFRAN) IV, polyethylene glycol  Consultants: Wound care  Procedures: None  Antibiotics: Discontinue IV vancomycin Continue IV cefepime  Assessment/Plan: Principal Problem:   Sickle cell pain crisis (HCC) Active Problems:   Ankle ulcer (HCC)   Controlled type 2 diabetes mellitus with hyperglycemia (HCC)  Sickle cell disease with pain crisis: Continue IV Dilaudid PCA without changes Toradol 15 mg IV every 6 hours Continue home medications Oxycodone 30 mg every 6 hours as needed for severe breakthrough pain Monitor vital signs very closely, reevaluate pain scale regularly, and supplemental oxygen as needed.  Right ankle ulcer: MRI shows medial malleoli and posterior hindfoot soft tissue swelling with small superficial ulcer along the posterior medial ankle.  No evidence of osteomyelitis or abscess.  Unchanged red marrow hyperplasia in the distal tibia, fibula, and calcaneus.  Right ankle ulcer dressing clean, dry, and intact.  Assessed by  wound care specialist today.  We will continue Santyl and dressing changes per wound care.  Continue empiric antibiotics.  Type 2 diabetes mellitus: Reviewed patient's primary care notes, most recent hemoglobin A1c was 6.2.  Patient does not have fructosamine on file.  He will need to follow-up with PCP as an outpatient Initiate sliding scale insulin, hold Jardiance at this time       Anemia of chronic disease: Patient's hemoglobin is stable and consistent with his baseline.  There is no clinical indication for blood transfusion at this time.  Continue to follow closely.  History of proteinuria: Continue lisinopril    Code Status: Full Code Family Communication: N/A Disposition Plan: Not yet ready for discharge  Nolon NationsLachina Moore Nori Poland  APRN, MSN, FNP-C [facility] [address]  [place], [place] [address] [phone]   If 5PM-8AM, please contact night-coverage.  01/24/2021, 11:40 AM  LOS: 1 day

## 2021-01-24 NOTE — Consult Note (Addendum)
WOC Nurse Consult Note: Reason for Consult: Consult requested for right ankle.  Performed remotely after review of progress notes and photos in the EMR. Wound type: Right ankle with full thickness wound; yellow wound bed with yellow drainage.   MRI does not indicate osteomyelitis and ABI indicates normal perfusion. Dressing procedure/placement/frequency: Topical treatment orders provided for bedside nurses to perform as follows to assist with enzymatic debridement of nonviable tissue: Apply Santyl to right ankle wound Q day, then cover with moist 2X2 and foam dressing.  (Change foam dressing Q 3 days or PRN soiling.) Please re-consult if further assistance is needed.  Thank-you,  Cammie Mcgee MSN, RN, CWOCN, [place], CNS [phone]

## 2021-01-25 ENCOUNTER — Other Ambulatory Visit (HOSPITAL_BASED_OUTPATIENT_CLINIC_OR_DEPARTMENT_OTHER): Payer: Self-pay

## 2021-01-25 DIAGNOSIS — D5700 Hb-SS disease with crisis, unspecified: Secondary | ICD-10-CM | POA: Diagnosis not present

## 2021-01-25 LAB — CBC
HCT: 32.6 % — ABNORMAL LOW (ref 39.0–52.0)
Hemoglobin: 11.4 g/dL — ABNORMAL LOW (ref 13.0–17.0)
MCH: 34 pg (ref 26.0–34.0)
MCHC: 35 g/dL (ref 30.0–36.0)
MCV: 97.3 fL (ref 80.0–100.0)
Platelets: 293 10*3/uL (ref 150–400)
RBC: 3.35 MIL/uL — ABNORMAL LOW (ref 4.22–5.81)
RDW: 19.9 % — ABNORMAL HIGH (ref 11.5–15.5)
WBC: 10.2 10*3/uL (ref 4.0–10.5)
nRBC: 2.3 % — ABNORMAL HIGH (ref 0.0–0.2)

## 2021-01-25 LAB — GLUCOSE, CAPILLARY
Glucose-Capillary: 108 mg/dL — ABNORMAL HIGH (ref 70–99)
Glucose-Capillary: 83 mg/dL (ref 70–99)

## 2021-01-25 MED ORDER — COLLAGENASE 250 UNIT/GM EX OINT
TOPICAL_OINTMENT | Freq: Every day | CUTANEOUS | 0 refills | Status: DC
  Filled 2021-01-25: qty 30, 14d supply, fill #0

## 2021-01-25 MED ORDER — CEPHALEXIN 500 MG PO CAPS
500.0000 mg | ORAL_CAPSULE | Freq: Four times a day (QID) | ORAL | 0 refills | Status: AC
  Filled 2021-01-25: qty 40, 10d supply, fill #0

## 2021-01-25 NOTE — Discharge Summary (Signed)
Physician Discharge Summary  Michael Mullins UQJ:[id number] DOB: 1981/04/17 DOA: 01/22/2021  PCP: Tawni Pummel, MD  Admit date: 01/22/2021  Discharge date: 01/25/2021  Discharge Diagnoses:  Principal Problem:   Sickle cell pain crisis [facility]) Active Problems:   Ankle ulcer ([place])   Controlled type 2 diabetes mellitus with hyperglycemia [facility])   Discharge Condition: Stable  Disposition:  Pt is discharged home in good condition and is to follow up with Tawni Pummel, MD this week to have labs evaluated. Michael Mullins is instructed to increase activity slowly and balance with rest for the next few days, and use prescribed medication to complete treatment of pain  Diet: Regular Wt Readings from Last 3 Encounters:  01/23/21 99.3 kg    History of present illness:  Michael Mullins is a 40 year old male with a medical history significant for sickle cell disease, chronic pain syndrome, opiate dependence and tolerance, history of foot ulcers, and history of diabetes mellitus type 2 presents to the ER for the second time in the past 3 days with worsening pain.  Patient states that he has developed an ulcer on the right foot, middle aspect of ankle over the last few days with some discharge.  He was prescribed clindamycin, which she has taken for 1 day and started having worsening pain of the right lower extremity.  Right lower extremity pain is typical of his sickle cell crisis.  He denies any chest pains or shortness of breath.  ER course: In the ER, patient's x-ray shows no bony involvement on the right foot.  On exam patient has a deep ulcer on the right foot on the medial aspect.  No discharge noted.  Labs show normal lactic acid with hemoglobin of 12.9.  Patient was admitted for sickle cell pain crisis and further management of foot ulceration.  COVID-19 test negative.  Hospital Course:  Sickle cell disease with pain crisis: Patient was admitted for sickle cell pain crisis and  managed appropriately with IVF, IV Dilaudid via PCA and IV Toradol, as well as other adjunct therapies per sickle cell pain management protocols.  IV Dilaudid PCA weaned appropriately.  Patient will resume home opiate medication regimen.  He is followed by [facility] hematology, who manages pain medication regimen.  Pain intensity has decreased to 4/10 and patient is requesting discharge home.  Right ankle ulcer: Right ankle ulcer to medial aspect.  Nondraining ulcer.  Tender to touch.  Appears to be improving following IV antibiotic therapy.  Patient will complete therapy with cephalexin 500 mg every 6 hours for total of 10 days.  Medication has been sent to patient's pharmacy, discussed at length.  Patient has an appointment to follow-up with wound care specialist on 01/29/2021.  Patient will continue Santyl ointment daily.  Discussed the importance of maintaining a clean dressing.  History of type 2 diabetes mellitus: Reviewed previous medical records from PCP.  His hemoglobin A1c 6 months prior was 6.2.  Following that hemoglobin A1c, it was decreased at 6.8 which is more than likely due to sickle cell disease.  He will need to be further evaluated with fructosamine as an outpatient.  We will continue Jardiance as previously prescribed.  He is to follow-up with primary care provider for medication management.  Recommend a low carbohydrate diet divided over small meals throughout the day.  Patient was therefore discharged home today in a hemodynamically stable condition.   Michael Mullins will follow-up with PCP within 1 week of this discharge. Michael Mullins was counseled  extensively about nonpharmacologic means of pain management, patient verbalized understanding and was appreciative of  the care received during this admission.   We discussed the need for good hydration, monitoring of hydration status, avoidance of heat, cold, stress, and infection triggers. We discussed the need to be adherent with taking Oxbryta  and other home medications. Patient was reminded of the need to seek medical attention immediately if any symptom of bleeding, anemia, or infection occurs.  Discharge Exam: Vitals:   01/25/21 0528 01/25/21 1024  BP: 116/61 117/66  Pulse: 63 61  Resp: 18 15  Temp: 98.3 F (36.8 C) 98.1 F (36.7 C)  SpO2: 97% 95%   Vitals:   01/25/21 0125 01/25/21 0441 01/25/21 0528 01/25/21 1024  BP: 120/70  116/61 117/66  Pulse: 64  63 61  Resp: _0 Temp: 98.1 F (36.7 C)  98.3 F (36.8 C) 98.1 F (36.7 C)  TempSrc: Oral  Oral Oral  SpO2: 94% 93% 97% 95%  Weight:      Height:        General appearance : Awake, alert, not in any distress. Speech Clear. Not toxic looking HEENT: Atraumatic and Normocephalic, pupils equally reactive to light and accomodation Neck: Supple, no JVD. No cervical lymphadenopathy.  Chest: Good air entry bilaterally, no added sounds  CVS: S1 S2 regular, no murmurs.  Abdomen: Bowel sounds present, Non tender and not distended with no gaurding, rigidity or rebound. Extremities: B/L Lower Ext shows no edema, both legs are warm to touch Neurology: Awake alert, and oriented X 3, CN II-XII intact, Non focal Skin: No Rash  Discharge Instructions  Discharge Instructions     Discharge patient   Complete by: As directed    Discharge disposition: 01-Home or Self Care   Discharge patient date: 01/25/2021      Allergies as of 01/25/2021       Reactions   Morphine Hives   Tolerates hydromorphone per MAR   Vancomycin Itching   Tolerates with longer infusion times and premedcation with IV bendryl        Medication List     STOP taking these medications    clindamycin 300 MG capsule Commonly known as: CLEOCIN       TAKE these medications    cephALEXin 500 MG capsule Commonly known as: KEFLEX Take 1 capsule (500 mg total) by mouth 4 (four) times daily for 10 days.   Claravis 40 MG capsule Generic drug: ISOtretinoin Take 1 capsule (40 mg total)  by mouth 2 times daily.   collagenase ointment Commonly known as: SANTYL Apply to the affected area topically daily. Start taking on: January 26, 2021   ferrous sulfate 325 (65 FE) MG tablet Commonly known as: FeroSul Take by mouth. What changed:  how much to take when to take this   folic acid 1 MG tablet Commonly known as: FOLVITE Take 1 tablet by mouth daily   Jardiance 10 MG Tabs tablet Generic drug: empagliflozin Take 1 tablet (10 mg total) by mouth daily.   lisinopril 2.5 MG tablet Commonly known as: ZESTRIL Take 1 tablet (2.5 mg total) by mouth daily.   methadone 10 MG tablet Commonly known as: DOLOPHINE Take 3 tablets by mouth 3 times daily.   naloxone 4 MG/0.1ML Liqd nasal spray kit Commonly known as: NARCAN Use as directed every 2-3 minutes until emergency arrives, call 911   Oxbryta 500 MG Tabs tablet Generic drug: voxelotor Take 1,500 mg by mouth daily.  oxycodone 30 MG immediate release tablet Commonly known as: ROXICODONE Take 1 tablet by mouth every 6 hours as needed for Pain.   triamcinolone cream 0.1 % Commonly known as: KENALOG Apply to affected skin of arms twice daily as needed. NEVER to face/neck/groin.   Vitamin D (Ergocalciferol) 1.25 MG (50000 UNIT) Caps capsule Commonly known as: DRISDOL Take 1 capsule by mouth every 30 days        The results of significant diagnostics from this hospitalization (including imaging, microbiology, ancillary and laboratory) are listed below for reference.    Significant Diagnostic Studies: DG Ankle Complete Right  Result Date: 01/22/2021 CLINICAL DATA:  Sickle cell disease EXAM: RIGHT ANKLE - COMPLETE 3+ VIEW COMPARISON:  None. FINDINGS: No fracture or malalignment. Apparent ulcer at the posterior ankle. No periostitis or bone destruction. IMPRESSION: No acute osseous abnormality Electronically Signed   By: Donavan Foil M.D.   On: 01/22/2021 22:59   DG Ankle Complete Right  Result Date:  01/19/2021 CLINICAL DATA:  Wound EXAM: RIGHT ANKLE - COMPLETE 3+ VIEW COMPARISON:  None. FINDINGS: No fracture or malalignment. No soft tissue emphysema. Small wound or ulcer posterior to the ankle. No convincing osseous destructive change. IMPRESSION: Probable wound or ulcer posterior to the ankle. No definite acute osseous abnormality Electronically Signed   By: Donavan Foil M.D.   On: 01/19/2021 16:13   MR ANKLE RIGHT WO CONTRAST  Result Date: 01/23/2021 CLINICAL DATA:  Right ankle ulcer.  Evaluate for osteomyelitis. EXAM: MRI OF THE RIGHT ANKLE WITHOUT CONTRAST TECHNIQUE: Multiplanar, multisequence MR imaging of the ankle was performed. No intravenous contrast was administered. COMPARISON:  Right ankle x-rays from yesterday. MRI right ankle dated January 07, 2016. FINDINGS: TENDONS Peroneal: Peroneal longus tendon intact. Peroneal brevis intact. Posteromedial: Posterior tibial tendon intact. Flexor digitorum longus tendon intact. Flexor hallucis longus tendon intact. Anterior: Tibialis anterior tendon intact. Extensor hallucis longus tendon intact Extensor digitorum longus tendon intact. Achilles:  Intact. Plantar Fascia: Intact. LIGAMENTS Lateral: Unchanged thinning of the anterior talofibular ligament, consistent with prior injury. Calcaneofibular ligament intact. Posterior talofibular ligament intact. Anterior and posterior tibiofibular ligaments intact. Medial: Deltoid ligament intact. Spring ligament intact. CARTILAGE Ankle Joint: No joint effusion. Normal ankle mortise. No chondral defect. Subtalar Joints/Sinus Tarsi: Normal subtalar joints. No subtalar joint effusion. Normal sinus tarsi. Bones: Unchanged patchy marrow T2 hyperintensity and T1 hypointensity in the distal tibia, fibula, and calcaneus, consistent with red marrow hyperplasia. No fracture or dislocation. Soft Tissue: Medial malleolar and posterior hindfoot soft tissue swelling with small superficial ulcer along the posteromedial ankle  (series 4, image 21). No fluid collection. IMPRESSION: 1. Medial malleolar and posterior hindfoot soft tissue swelling with small superficial ulcer along the posteromedial ankle. No evidence of osteomyelitis or abscess. 2. Unchanged red marrow hyperplasia in the distal tibia, fibula, and calcaneus. Electronically Signed   By: Titus Dubin M.D.   On: 01/23/2021 07:21   VAS [place] ABI WITH/WO TBI  Result Date: 01/23/2021  LOWER EXTREMITY DOPPLER STUDY Patient Name:  VIGNESH WILLERT  Date of Exam:   01/23/2021 Medical Rec #: [id number]       Accession #:    [id number] Date of Birth: [birth year]       Patient Gender: M Patient Age:   24 years Exam Location:  [facility] Procedure:      VAS [place] ABI WITH/WO TBI Referring Phys: Gean Birchwood --------------------------------------------------------------------------------  Indications: Ulceration, and Bilateral foot pain (PT in Sickle Cell crisis). High Risk Factors: Diabetes, no  history of smoking.  Comparison Study: No previous exams Performing Technologist: Hill, Jody RVT, RDMS  Examination Guidelines: A complete evaluation includes at minimum, Doppler waveform signals and systolic blood pressure reading at the level of bilateral brachial, anterior tibial, and posterior tibial arteries, when vessel segments are accessible. Bilateral testing is considered an integral part of a complete examination. Photoelectric Plethysmograph (PPG) waveforms and toe systolic pressure readings are included as required and additional duplex testing as needed. Limited examinations for reoccurring indications may be performed as noted.  ABI Findings: +---------+------------------+-----+---------+--------+ Right    Rt Pressure (mmHg)IndexWaveform Comment  +---------+------------------+-----+---------+--------+ Brachial 113                    triphasic         +---------+------------------+-----+---------+--------+ PTA      126               1.11 triphasic          +---------+------------------+-----+---------+--------+ DP       101               0.89 biphasic          +---------+------------------+-----+---------+--------+ Great Toe42                0.37 Abnormal          +---------+------------------+-----+---------+--------+ +---------+------------------+-----+---------+-------+ Left     Lt Pressure (mmHg)IndexWaveform Comment +---------+------------------+-----+---------+-------+ Brachial 114                    triphasic        +---------+------------------+-----+---------+-------+ PTA      130               1.14 triphasic        +---------+------------------+-----+---------+-------+ DP       131               1.15 triphasic        +---------+------------------+-----+---------+-------+ Great Toe65                0.57 Abnormal         +---------+------------------+-----+---------+-------+ +-------+-----------+-----------+------------+------------+ ABI/TBIToday's ABIToday's TBIPrevious ABIPrevious TBI +-------+-----------+-----------+------------+------------+ Right  1.11       0.37                                +-------+-----------+-----------+------------+------------+ Left   1.15       0.57                                +-------+-----------+-----------+------------+------------+  Summary: Right: Resting right ankle-brachial index is within normal range. No evidence of significant right lower extremity arterial disease. The right toe-brachial index is abnormal (moderate/severe small vessel disease). Left: Resting left ankle-brachial index is within normal range. No evidence of significant left lower extremity arterial disease. The left toe-brachial index is abnormal (moderate small vessel disease).  *See table(s) above for measurements and observations.  Electronically signed by Deitra Mayo MD on 01/23/2021 at 3:28:07 PM.    Final     Microbiology: Recent Results (from the past 240 hour(s))  SARS  CORONAVIRUS 2 (TAT 6-24 HRS) Nasopharyngeal Nasopharyngeal Swab     Status: None   Collection Time: 01/23/21  3:33 AM   Specimen: Nasopharyngeal Swab  Result Value Ref Range Status   SARS Coronavirus 2 NEGATIVE NEGATIVE Final    Comment: (NOTE) SARS-CoV-2 target nucleic  acids are NOT DETECTED.  The SARS-CoV-2 RNA is generally detectable in upper and lower respiratory specimens during the acute phase of infection. Negative results do not preclude SARS-CoV-2 infection, do not rule out co-infections with other pathogens, and should not be used as the sole basis for treatment or other patient management decisions. Negative results must be combined with clinical observations, patient history, and epidemiological information. The expected result is Negative.  Fact Sheet for Patients: SugarRoll.be  Fact Sheet for Healthcare Providers: https://www.woods-mathews.com/  This test is not yet approved or cleared by the [place] FDA and  has been authorized for detection and/or diagnosis of SARS-CoV-2 by FDA under an Emergency Use Authorization (EUA). This EUA will remain  in effect (meaning this test can be used) for the duration of the COVID-19 declaration under Se ction 564(b)(1) of the Act, 21 U.S.C. section 360bbb-3(b)(1), unless the authorization is terminated or revoked sooner.  Performed at [facility], [place] [address] [place], [place] [address]      Labs: Basic Metabolic Panel: Recent Labs  Lab 01/19/21 1532 01/22/21 2333 01/23/21 0606 01/24/21 0826  NA 139 137  --  137  K 4.3 3.8  --  4.3  CL 106 103  --  106  CO2 26 27  --  24  GLUCOSE 115* 126*  --  83  BUN 8 7  --  9  CREATININE 0.69 0.83 0.70 0.66  CALCIUM 9.2 9.5  --  8.7*   Liver Function Tests: Recent Labs  Lab 01/19/21 1532 01/22/21 2333 01/24/21 0826  AST 48* 42* 42*  ALT _0 ALKPHOS 57 66 51  BILITOT 3.0* 3.5* 3.0*  PROT 8.1 9.0* 7.4   ALBUMIN 4.3 4.8 3.9   No results for input(s): LIPASE, AMYLASE in the last 168 hours. No results for input(s): AMMONIA in the last 168 hours. CBC: Recent Labs  Lab 01/19/21 1532 01/22/21 2333 01/23/21 0606 01/24/21 0826 01/25/21 0916  WBC 10.5 9.9 11.3* 9.9 10.2  NEUTROABS 4.3 4.4  --   --   --   HGB 11.3* 12.9* 11.6* 11.1* 11.4*  HCT 31.6* 36.2* 32.0* 30.7* 32.6*  MCV 95.8 96.0 94.7 93.9 97.3  PLT 358 332 283 298 293   Cardiac Enzymes: No results for input(s): CKTOTAL, CKMB, CKMBINDEX, TROPONINI in the last 168 hours. BNP: Invalid input(s): POCBNP CBG: Recent Labs  Lab 01/22/21 2328 01/24/21 1743 01/24/21 2208 01/25/21 0735 01/25/21 1217  GLUCAP 112* 77 120* 108* 83    Time coordinating discharge: 35 minutes  Signed:  Donia Pounds  APRN, MSN, FNP-C [facility] [place], [place] [address] [phone]  [facility] 01/25/2021, 3:21 PM

## 2021-01-25 NOTE — Progress Notes (Signed)
Pt was on dilaudid 1 mg PCA during admission. Was discharge. By the time the RN wasted the remaining 10 ml of 1 mg in the syringe. Pt's name was out from the pyxis.   10 ml dilaudid wasted with Marquita Palms RN.  Charlynne Pander notified.

## 2021-01-26 ENCOUNTER — Other Ambulatory Visit: Payer: Self-pay | Admitting: *Deleted

## 2021-01-26 LAB — HEMOGLOBIN A1C
Hgb A1c MFr Bld: <4.2 % — ABNORMAL LOW (ref 4.8–5.6)
Mean Plasma Glucose: <74 mg/dL

## 2021-01-26 NOTE — Patient Outreach (Signed)
[facility]) Care Management  01/26/2021  Michael Mullins [birth year] [id number]   Transition of care call/case closure   Referral received:01/24/21 Initial outreach:01/26/21 Insurance: [employer]    Subjective: Initial successful telephone call to patient's preferred number in order to complete transition of care assessment; 2 HIPAA identifiers verified. Explained purpose of call and completed transition of care assessment.  Michael Mullins states that he is still feeling a little weak on today and has experienced some dizziness when standing up. Reinforced staying hydrated, balanced diets, states he hasn't checked his blood sugar on today but he will and has not eaten yet.  He reports still having some pain in left leg getting relief with prn oxycodone. He discussed that it takes a few days to get strength after having a crisis. He denies any worsening symptoms of redness, swelling drainage at left ankle ulcer he reports having dressing supplies and prescribed medication for dressing change denies bowel or bladder problems.  Significant other is assisting with his recovery.   Reviewed accessing the following [place] Benefits : He discussed ongoing health issues of Diabetes that is under control and states he will review [place] education benefit programs as introduced to  says she does not need a referral to one of the  chronic disease management programs at this time  He does not have the hospital indemnity, requested information on how to find out more about his benefits, provided benefits office contact number and to file FMLA. 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     Objective:  Mr. Michael Mullins  was hospitalized at [facility] 8/15-8/18/22 for Sickle Cell Crisis , Right ankle ulcer Comorbidities include: Diabetes type 2, Sickle Cell Anemia.  He was discharged to home on 01/25/21 without the need for home health  services or DME.   Assessment:  Patient voices good understanding of all discharge instructions.  See transition of care flowsheet for assessment details.   Plan:  Reviewed hospital discharge diagnosis of Sickle Cell Pain crisis   and discharge treatment plan using hospital discharge instructions, assessing medication adherence, reviewing problems requiring provider notification, and discussing the importance of follow up with primary care provider and/or specialists as directed.  Reviewed  healthy lifestyle program information to receive discounted premium for  2023   Step 1: Get  your annual physical  Step 2: Complete your health assessment  Step 3:Identify your current health status and complete the corresponding action step between June 10, 2020 and February 08, 2021.    Marland Kitchen    No ongoing care management needs identified so will close case to [facility] Care Management services and route successful outreach letter with [facility] Care Management pamphlet and 24 Hour Nurse Line Magnet to [employer] Care Management clinical pool to be mailed to patient's home address.  Thanked patient for their services to [facility].  Egbert Garibaldi, RN, BSN  [facility] Care Management,Care Management Coordinator  [phone]- Mobile [phone]- Toll Free Main Office

## 2021-01-29 DIAGNOSIS — Q8901 Asplenia (congenital): Secondary | ICD-10-CM | POA: Diagnosis not present

## 2021-01-29 DIAGNOSIS — D5700 Hb-SS disease with crisis, unspecified: Secondary | ICD-10-CM | POA: Diagnosis not present

## 2021-01-29 DIAGNOSIS — E11622 Type 2 diabetes mellitus with other skin ulcer: Secondary | ICD-10-CM | POA: Diagnosis not present

## 2021-01-29 DIAGNOSIS — I878 Other specified disorders of veins: Secondary | ICD-10-CM | POA: Diagnosis not present

## 2021-01-29 DIAGNOSIS — L97311 Non-pressure chronic ulcer of right ankle limited to breakdown of skin: Secondary | ICD-10-CM | POA: Diagnosis not present

## 2021-01-29 DIAGNOSIS — E11621 Type 2 diabetes mellitus with foot ulcer: Secondary | ICD-10-CM | POA: Diagnosis not present

## 2021-01-29 DIAGNOSIS — E559 Vitamin D deficiency, unspecified: Secondary | ICD-10-CM | POA: Diagnosis not present

## 2021-02-02 ENCOUNTER — Other Ambulatory Visit (HOSPITAL_BASED_OUTPATIENT_CLINIC_OR_DEPARTMENT_OTHER): Payer: Self-pay

## 2021-02-02 DIAGNOSIS — E11622 Type 2 diabetes mellitus with other skin ulcer: Secondary | ICD-10-CM | POA: Diagnosis not present

## 2021-02-02 DIAGNOSIS — L97311 Non-pressure chronic ulcer of right ankle limited to breakdown of skin: Secondary | ICD-10-CM | POA: Diagnosis not present

## 2021-02-02 DIAGNOSIS — I878 Other specified disorders of veins: Secondary | ICD-10-CM | POA: Diagnosis not present

## 2021-02-02 MED ORDER — OXYCODONE HCL 30 MG PO TABS
30.0000 mg | ORAL_TABLET | Freq: Four times a day (QID) | ORAL | 0 refills | Status: DC | PRN
  Filled 2021-02-02: qty 120, 30d supply, fill #0

## 2021-02-02 MED ORDER — METHADONE HCL 10 MG PO TABS
ORAL_TABLET | ORAL | 0 refills | Status: DC
  Filled 2021-02-02: qty 270, 30d supply, fill #0

## 2021-02-08 DIAGNOSIS — L97312 Non-pressure chronic ulcer of right ankle with fat layer exposed: Secondary | ICD-10-CM | POA: Diagnosis not present

## 2021-02-08 DIAGNOSIS — E11622 Type 2 diabetes mellitus with other skin ulcer: Secondary | ICD-10-CM | POA: Diagnosis not present

## 2021-02-14 ENCOUNTER — Other Ambulatory Visit (HOSPITAL_BASED_OUTPATIENT_CLINIC_OR_DEPARTMENT_OTHER): Payer: Self-pay

## 2021-02-15 DIAGNOSIS — L97322 Non-pressure chronic ulcer of left ankle with fat layer exposed: Secondary | ICD-10-CM | POA: Diagnosis not present

## 2021-02-15 DIAGNOSIS — E11622 Type 2 diabetes mellitus with other skin ulcer: Secondary | ICD-10-CM | POA: Diagnosis not present

## 2021-02-22 ENCOUNTER — Other Ambulatory Visit (HOSPITAL_BASED_OUTPATIENT_CLINIC_OR_DEPARTMENT_OTHER): Payer: Self-pay

## 2021-02-22 DIAGNOSIS — Q8901 Asplenia (congenital): Secondary | ICD-10-CM | POA: Diagnosis not present

## 2021-02-22 DIAGNOSIS — I878 Other specified disorders of veins: Secondary | ICD-10-CM | POA: Diagnosis not present

## 2021-02-22 DIAGNOSIS — I10 Essential (primary) hypertension: Secondary | ICD-10-CM | POA: Diagnosis not present

## 2021-02-22 DIAGNOSIS — D571 Sickle-cell disease without crisis: Secondary | ICD-10-CM | POA: Diagnosis not present

## 2021-02-22 DIAGNOSIS — E11622 Type 2 diabetes mellitus with other skin ulcer: Secondary | ICD-10-CM | POA: Diagnosis not present

## 2021-02-22 DIAGNOSIS — L97311 Non-pressure chronic ulcer of right ankle limited to breakdown of skin: Secondary | ICD-10-CM | POA: Diagnosis not present

## 2021-02-22 DIAGNOSIS — I517 Cardiomegaly: Secondary | ICD-10-CM | POA: Diagnosis not present

## 2021-02-22 DIAGNOSIS — Z9189 Other specified personal risk factors, not elsewhere classified: Secondary | ICD-10-CM | POA: Diagnosis not present

## 2021-02-22 DIAGNOSIS — E559 Vitamin D deficiency, unspecified: Secondary | ICD-10-CM | POA: Diagnosis not present

## 2021-02-22 DIAGNOSIS — I361 Nonrheumatic tricuspid (valve) insufficiency: Secondary | ICD-10-CM | POA: Diagnosis not present

## 2021-02-22 DIAGNOSIS — I313 Pericardial effusion (noninflammatory): Secondary | ICD-10-CM | POA: Diagnosis not present

## 2021-02-22 DIAGNOSIS — I340 Nonrheumatic mitral (valve) insufficiency: Secondary | ICD-10-CM | POA: Diagnosis not present

## 2021-02-22 DIAGNOSIS — G8929 Other chronic pain: Secondary | ICD-10-CM | POA: Diagnosis not present

## 2021-02-22 DIAGNOSIS — R110 Nausea: Secondary | ICD-10-CM | POA: Diagnosis not present

## 2021-02-22 MED ORDER — ONDANSETRON HCL 4 MG PO TABS
ORAL_TABLET | ORAL | 2 refills | Status: DC
  Filled 2021-02-22: qty 60, 20d supply, fill #0

## 2021-03-01 ENCOUNTER — Other Ambulatory Visit (HOSPITAL_BASED_OUTPATIENT_CLINIC_OR_DEPARTMENT_OTHER): Payer: Self-pay

## 2021-03-02 ENCOUNTER — Other Ambulatory Visit (HOSPITAL_BASED_OUTPATIENT_CLINIC_OR_DEPARTMENT_OTHER): Payer: Self-pay

## 2021-03-02 DIAGNOSIS — E11622 Type 2 diabetes mellitus with other skin ulcer: Secondary | ICD-10-CM | POA: Diagnosis not present

## 2021-03-02 DIAGNOSIS — L97312 Non-pressure chronic ulcer of right ankle with fat layer exposed: Secondary | ICD-10-CM | POA: Diagnosis not present

## 2021-03-02 MED ORDER — METHADONE HCL 10 MG PO TABS
30.0000 mg | ORAL_TABLET | Freq: Three times a day (TID) | ORAL | 0 refills | Status: DC
  Filled 2021-03-06: qty 270, 30d supply, fill #0

## 2021-03-02 MED ORDER — OXYCODONE HCL 30 MG PO TABS
30.0000 mg | ORAL_TABLET | Freq: Four times a day (QID) | ORAL | 0 refills | Status: DC | PRN
  Filled 2021-03-06: qty 120, 30d supply, fill #0

## 2021-03-06 ENCOUNTER — Other Ambulatory Visit (HOSPITAL_BASED_OUTPATIENT_CLINIC_OR_DEPARTMENT_OTHER): Payer: Self-pay

## 2021-03-08 DIAGNOSIS — L97312 Non-pressure chronic ulcer of right ankle with fat layer exposed: Secondary | ICD-10-CM | POA: Diagnosis not present

## 2021-03-08 DIAGNOSIS — I878 Other specified disorders of veins: Secondary | ICD-10-CM | POA: Diagnosis not present

## 2021-03-15 DIAGNOSIS — L905 Scar conditions and fibrosis of skin: Secondary | ICD-10-CM | POA: Diagnosis not present

## 2021-03-26 DIAGNOSIS — L700 Acne vulgaris: Secondary | ICD-10-CM | POA: Diagnosis not present

## 2021-03-26 DIAGNOSIS — L662 Folliculitis decalvans: Secondary | ICD-10-CM | POA: Diagnosis not present

## 2021-03-26 DIAGNOSIS — Z5181 Encounter for therapeutic drug level monitoring: Secondary | ICD-10-CM | POA: Diagnosis not present

## 2021-04-02 ENCOUNTER — Other Ambulatory Visit (HOSPITAL_BASED_OUTPATIENT_CLINIC_OR_DEPARTMENT_OTHER): Payer: Self-pay

## 2021-04-02 MED ORDER — OXYCODONE HCL 30 MG PO TABS
30.0000 mg | ORAL_TABLET | Freq: Four times a day (QID) | ORAL | 0 refills | Status: DC | PRN
  Filled 2021-04-02: qty 120, 30d supply, fill #0

## 2021-04-02 MED ORDER — METHADONE HCL 10 MG PO TABS
ORAL_TABLET | ORAL | 0 refills | Status: DC
  Filled 2021-04-02: qty 270, 30d supply, fill #0

## 2021-04-03 ENCOUNTER — Other Ambulatory Visit (HOSPITAL_BASED_OUTPATIENT_CLINIC_OR_DEPARTMENT_OTHER): Payer: Self-pay

## 2021-04-30 ENCOUNTER — Other Ambulatory Visit (HOSPITAL_BASED_OUTPATIENT_CLINIC_OR_DEPARTMENT_OTHER): Payer: Self-pay

## 2021-04-30 DIAGNOSIS — L700 Acne vulgaris: Secondary | ICD-10-CM | POA: Diagnosis not present

## 2021-04-30 DIAGNOSIS — L089 Local infection of the skin and subcutaneous tissue, unspecified: Secondary | ICD-10-CM | POA: Diagnosis not present

## 2021-04-30 DIAGNOSIS — Z5181 Encounter for therapeutic drug level monitoring: Secondary | ICD-10-CM | POA: Diagnosis not present

## 2021-04-30 DIAGNOSIS — L663 Perifolliculitis capitis abscedens: Secondary | ICD-10-CM | POA: Diagnosis not present

## 2021-04-30 MED ORDER — OXYCODONE HCL 30 MG PO TABS
30.0000 mg | ORAL_TABLET | Freq: Four times a day (QID) | ORAL | 0 refills | Status: DC | PRN
  Filled 2021-04-30: qty 120, 30d supply, fill #0

## 2021-04-30 MED ORDER — METHADONE HCL 10 MG PO TABS
ORAL_TABLET | ORAL | 0 refills | Status: DC
  Filled 2021-04-30: qty 270, 30d supply, fill #0

## 2021-05-01 ENCOUNTER — Other Ambulatory Visit (HOSPITAL_BASED_OUTPATIENT_CLINIC_OR_DEPARTMENT_OTHER): Payer: Self-pay

## 2021-05-23 ENCOUNTER — Encounter (HOSPITAL_COMMUNITY): Payer: Self-pay

## 2021-05-23 ENCOUNTER — Other Ambulatory Visit: Payer: Self-pay

## 2021-05-23 ENCOUNTER — Emergency Department (HOSPITAL_COMMUNITY)
Admission: EM | Admit: 2021-05-23 | Discharge: 2021-05-23 | Disposition: A | Discharge status: Home / Self Care | Payer: 59 | Source: Home / Self Care | Attending: Emergency Medicine | Admitting: Emergency Medicine

## 2021-05-23 DIAGNOSIS — D5700 Hb-SS disease with crisis, unspecified: Secondary | ICD-10-CM | POA: Insufficient documentation

## 2021-05-23 DIAGNOSIS — Z7984 Long term (current) use of oral hypoglycemic drugs: Secondary | ICD-10-CM | POA: Diagnosis not present

## 2021-05-23 DIAGNOSIS — I517 Cardiomegaly: Secondary | ICD-10-CM | POA: Diagnosis not present

## 2021-05-23 DIAGNOSIS — Z881 Allergy status to other antibiotic agents status: Secondary | ICD-10-CM | POA: Diagnosis not present

## 2021-05-23 DIAGNOSIS — D638 Anemia in other chronic diseases classified elsewhere: Secondary | ICD-10-CM | POA: Diagnosis not present

## 2021-05-23 DIAGNOSIS — R6520 Severe sepsis without septic shock: Secondary | ICD-10-CM | POA: Diagnosis not present

## 2021-05-23 DIAGNOSIS — E119 Type 2 diabetes mellitus without complications: Secondary | ICD-10-CM | POA: Insufficient documentation

## 2021-05-23 DIAGNOSIS — J9601 Acute respiratory failure with hypoxia: Secondary | ICD-10-CM | POA: Diagnosis not present

## 2021-05-23 DIAGNOSIS — M25572 Pain in left ankle and joints of left foot: Secondary | ICD-10-CM | POA: Diagnosis not present

## 2021-05-23 DIAGNOSIS — Z79899 Other long term (current) drug therapy: Secondary | ICD-10-CM | POA: Insufficient documentation

## 2021-05-23 DIAGNOSIS — Q8901 Asplenia (congenital): Secondary | ICD-10-CM | POA: Diagnosis not present

## 2021-05-23 DIAGNOSIS — D571 Sickle-cell disease without crisis: Secondary | ICD-10-CM | POA: Diagnosis not present

## 2021-05-23 DIAGNOSIS — R0902 Hypoxemia: Secondary | ICD-10-CM | POA: Diagnosis not present

## 2021-05-23 DIAGNOSIS — A419 Sepsis, unspecified organism: Secondary | ICD-10-CM | POA: Diagnosis not present

## 2021-05-23 DIAGNOSIS — D564 Hereditary persistence of fetal hemoglobin [HPFH]: Secondary | ICD-10-CM | POA: Diagnosis not present

## 2021-05-23 DIAGNOSIS — M7989 Other specified soft tissue disorders: Secondary | ICD-10-CM | POA: Diagnosis not present

## 2021-05-23 DIAGNOSIS — R911 Solitary pulmonary nodule: Secondary | ICD-10-CM | POA: Diagnosis not present

## 2021-05-23 DIAGNOSIS — J9811 Atelectasis: Secondary | ICD-10-CM | POA: Diagnosis not present

## 2021-05-23 DIAGNOSIS — G8929 Other chronic pain: Secondary | ICD-10-CM | POA: Diagnosis not present

## 2021-05-23 DIAGNOSIS — Z86718 Personal history of other venous thrombosis and embolism: Secondary | ICD-10-CM | POA: Diagnosis not present

## 2021-05-23 DIAGNOSIS — Z885 Allergy status to narcotic agent status: Secondary | ICD-10-CM | POA: Diagnosis not present

## 2021-05-23 DIAGNOSIS — G894 Chronic pain syndrome: Secondary | ICD-10-CM | POA: Diagnosis not present

## 2021-05-23 DIAGNOSIS — R0602 Shortness of breath: Secondary | ICD-10-CM | POA: Diagnosis not present

## 2021-05-23 DIAGNOSIS — J189 Pneumonia, unspecified organism: Secondary | ICD-10-CM | POA: Diagnosis not present

## 2021-05-23 DIAGNOSIS — Z20822 Contact with and (suspected) exposure to covid-19: Secondary | ICD-10-CM | POA: Diagnosis not present

## 2021-05-23 DIAGNOSIS — E559 Vitamin D deficiency, unspecified: Secondary | ICD-10-CM | POA: Diagnosis not present

## 2021-05-23 LAB — CBC WITH DIFFERENTIAL/PLATELET
Abs Immature Granulocytes: 0.17 10*3/uL — ABNORMAL HIGH (ref 0.00–0.07)
Basophils Absolute: 0.1 10*3/uL (ref 0.0–0.1)
Basophils Relative: 1 %
Eosinophils Absolute: 0.4 10*3/uL (ref 0.0–0.5)
Eosinophils Relative: 2 %
HCT: 28.6 % — ABNORMAL LOW (ref 39.0–52.0)
Hemoglobin: 10.3 g/dL — ABNORMAL LOW (ref 13.0–17.0)
Immature Granulocytes: 1 %
Lymphocytes Relative: 18 %
Lymphs Abs: 3.2 10*3/uL (ref 0.7–4.0)
MCH: 31.2 pg (ref 26.0–34.0)
MCHC: 36 g/dL (ref 30.0–36.0)
MCV: 86.7 fL (ref 80.0–100.0)
Monocytes Absolute: 3.4 10*3/uL — ABNORMAL HIGH (ref 0.1–1.0)
Monocytes Relative: 19 %
Neutro Abs: 10.4 10*3/uL — ABNORMAL HIGH (ref 1.7–7.7)
Neutrophils Relative %: 59 %
Platelets: 328 10*3/uL (ref 150–400)
RBC: 3.3 MIL/uL — ABNORMAL LOW (ref 4.22–5.81)
RDW: 29.5 % — ABNORMAL HIGH (ref 11.5–15.5)
WBC: 17.6 10*3/uL — ABNORMAL HIGH (ref 4.0–10.5)
nRBC: 4.4 % — ABNORMAL HIGH (ref 0.0–0.2)

## 2021-05-23 LAB — RETICULOCYTES
Immature Retic Fract: 37 % — ABNORMAL HIGH (ref 2.3–15.9)
RBC.: 3.32 MIL/uL — ABNORMAL LOW (ref 4.22–5.81)
Retic Count, Absolute: 590 10*3/uL — ABNORMAL HIGH (ref 19.0–186.0)
Retic Ct Pct: 17.8 % — ABNORMAL HIGH (ref 0.4–3.1)

## 2021-05-23 LAB — BASIC METABOLIC PANEL
Anion gap: 9 (ref 5–15)
BUN: 14 mg/dL (ref 6–20)
CO2: 25 mmol/L (ref 22–32)
Calcium: 9.1 mg/dL (ref 8.9–10.3)
Chloride: 102 mmol/L (ref 98–111)
Creatinine, Ser: 0.6 mg/dL — ABNORMAL LOW (ref 0.61–1.24)
GFR, Estimated: >60 mL/min (ref >60)
Glucose, Bld: 121 mg/dL — ABNORMAL HIGH (ref 70–99)
Potassium: 4.2 mmol/L (ref 3.5–5.1)
Sodium: 136 mmol/L (ref 135–145)

## 2021-05-23 MED ORDER — DIPHENHYDRAMINE HCL 25 MG PO CAPS
25.0000 mg | ORAL_CAPSULE | ORAL | Status: DC | PRN
  Administered 2021-05-23 (×2): 25 mg via ORAL
  Filled 2021-05-23 (×2): qty 1

## 2021-05-23 MED ORDER — KETOROLAC TROMETHAMINE 15 MG/ML IJ SOLN
15.0000 mg | INTRAMUSCULAR | Status: AC
  Administered 2021-05-23: 08:00:00 15 mg via INTRAVENOUS
  Filled 2021-05-23: qty 1

## 2021-05-23 MED ORDER — HYDROMORPHONE HCL 2 MG/ML IJ SOLN
2.0000 mg | INTRAMUSCULAR | Status: AC
  Administered 2021-05-23: 09:00:00 2 mg via INTRAVENOUS
  Filled 2021-05-23: qty 1

## 2021-05-23 MED ORDER — SODIUM CHLORIDE 0.45 % IV SOLN
INTRAVENOUS | Status: DC
  Administered 2021-05-23: 09:00:00 1000 mL via INTRAVENOUS

## 2021-05-23 MED ORDER — HYDROMORPHONE HCL 2 MG/ML IJ SOLN
2.0000 mg | INTRAMUSCULAR | Status: AC
  Administered 2021-05-23: 08:00:00 2 mg via INTRAVENOUS
  Filled 2021-05-23: qty 1

## 2021-05-23 MED ORDER — ONDANSETRON HCL 4 MG/2ML IJ SOLN
4.0000 mg | INTRAMUSCULAR | Status: DC | PRN
  Administered 2021-05-23: 08:00:00 4 mg via INTRAVENOUS
  Filled 2021-05-23: qty 2

## 2021-05-23 NOTE — ED Provider Notes (Signed)
[facility]-EMERGENCY DEPT Provider Note   CSN: [id number] Arrival date & time: 05/23/21  0543     History Chief Complaint  Patient presents with   Sickle Cell Pain Crisis    Michael Mullins is a 40 y.o. male.  The history is provided by the patient and medical records. No language interpreter was used.  Sickle Cell Pain Crisis   40 year old male with hx of SSC and DM here with sickle cell related pain.  Patient report 3 days ago he developed pain to his lower back that felt similar to his sickle cell crisis.  He took his usual medication with some improvement.  Later on he felt pain to his left foot.  Pain is described as dull or unrelenting pain to the back of his heel.  The pain did improve with home medication and yesterday he felt a bit better however today his pain came back despite taking his seizure medication.  Patient denies having any fevers or chills, no chest pain, shortness of breath, productive cough, no congestion.  Denies any recent sick contact.  Has been eating drinking fine.  Denies having fever or chills.  States pain felt similar to prior sickle cell crisis.  States last crisis was approximately 5 months ago.  Patient unsure what triggers his pain.    Past Medical History:  Diagnosis Date   Diabetes mellitus without complication (HCC)    Sickle cell anemia (HCC)     Patient Active Problem List   Diagnosis Date Noted   Sickle cell pain crisis (HCC) 01/23/2021   Ankle ulcer (HCC) 01/23/2021   Controlled type 2 diabetes mellitus with hyperglycemia (HCC) 01/23/2021   FOOT ULCER 01/14/2009   CHEST PAIN 01/14/2009    History reviewed. No pertinent surgical history.     History reviewed. No pertinent family history.  Social History   Tobacco Use   Smoking status: Never   Smokeless tobacco: Never    Home Medications Prior to Admission medications   Medication Sig Start Date End Date Taking? Authorizing Provider  collagenase  (SANTYL) ointment Apply to the affected area topically daily. 01/26/21   Massie Maroon, FNP  empagliflozin (JARDIANCE) 10 MG TABS tablet Take 1 tablet (10 mg total) by mouth daily. 11/03/20     ergocalciferol (VITAMIN D2) 1.25 MG (50000 UT) capsule Take 1 capsule by mouth every 30 days 08/07/20     ferrous sulfate (FEROSUL) 325 (65 FE) MG tablet Take by mouth. Patient taking differently: Take 325 mg by mouth every other day. 08/07/20     folic acid (FOLVITE) 1 MG tablet Take 1 tablet by mouth daily 08/07/20     lisinopril (ZESTRIL) 2.5 MG tablet Take 1 tablet (2.5 mg total) by mouth daily. 11/03/20     methadone (DOLOPHINE) 10 MG tablet Take 3 tablets by mouth 3 times daily. 04/30/21     naloxone (NARCAN) nasal spray 4 mg/0.1 mL Use as directed every 2-3 minutes until emergency arrives, call 911 08/07/20     ondansetron (ZOFRAN) 4 MG tablet Take 1 tablet (4 mg total) by mouth every 8 (eight) hours as needed for Nausea / Vomiting. 02/22/21     OXBRYTA 500 MG TABS tablet Take 1,500 mg by mouth daily. 01/01/21   [provider]  oxycodone (ROXICODONE) 30 MG immediate release tablet Take 1 tablet by mouth every 6 hours as needed for pain. 04/30/21     triamcinolone cream (KENALOG) 0.1 % Apply to affected skin of  arms twice daily as needed. NEVER to face/neck/groin. Patient not taking: No sig reported 11/28/20       Allergies    Morphine and Vancomycin  Review of Systems   Review of Systems  All other systems reviewed and are negative.  Physical Exam Updated Vital Signs BP 139/84 (BP Location: Left Arm)    Pulse 79    Temp (!) 97.5 F (36.4 C) (Oral)    Resp 18    Ht 5\' 11"  (1.803 m)    Wt 99.8 kg    SpO2 91%    BMI 30.68 kg/m   Physical Exam Vitals and nursing note reviewed.  Constitutional:      General: He is not in acute distress.    Appearance: He is well-developed.  HENT:     Head: Atraumatic.  Eyes:     Conjunctiva/sclera: Conjunctivae normal.  Cardiovascular:     Rate and  Rhythm: Normal rate and regular rhythm.     Pulses: Normal pulses.     Heart sounds: Normal heart sounds.  Pulmonary:     Effort: Pulmonary effort is normal.     Breath sounds: Normal breath sounds.  Abdominal:     Palpations: Abdomen is soft.     Tenderness: There is no abdominal tenderness.  Musculoskeletal:        General: Tenderness (Left lower extremity: Tenderness about the left foot towards the left Achilles region without any overlying skin changes.  Intact dorsalis pedis pulse, normal skin tone and temperature.  No deformity noted.  No calf tenderness.  No edema) present. No swelling.     Cervical back: Neck supple.  Skin:    Findings: No rash.  Neurological:     Mental Status: He is alert.    ED Results / Procedures / Treatments   Labs (all labs ordered are listed, but only abnormal results are displayed) Labs Reviewed  BASIC METABOLIC PANEL - Abnormal; Notable for the following components:      Result Value   Glucose, Bld 121 (*)    Creatinine, Ser 0.60 (*)    All other components within normal limits  CBC WITH DIFFERENTIAL/PLATELET - Abnormal; Notable for the following components:   WBC 17.6 (*)    RBC 3.30 (*)    Hemoglobin 10.3 (*)    HCT 28.6 (*)    RDW 29.5 (*)    nRBC 4.4 (*)    Neutro Abs 10.4 (*)    Monocytes Absolute 3.4 (*)    Abs Immature Granulocytes 0.17 (*)    All other components within normal limits  RETICULOCYTES - Abnormal; Notable for the following components:   Retic Ct Pct 17.8 (*)    RBC. 3.32 (*)    Retic Count, Absolute 590.0 (*)    Immature Retic Fract 37.0 (*)    All other components within normal limits    EKG None  Radiology No results found.  Procedures Procedures   Medications Ordered in ED Medications  0.45 % sodium chloride infusion (1,000 mLs Intravenous New Bag/Given 05/23/21 0850)  diphenhydrAMINE (BENADRYL) capsule 25-50 mg (25 mg Oral Given 05/23/21 1017)  ondansetron (ZOFRAN) injection 4 mg (4 mg Intravenous  Given 05/23/21 0754)  ketorolac (TORADOL) 15 MG/ML injection 15 mg (15 mg Intravenous Given 05/23/21 0755)  HYDROmorphone (DILAUDID) injection 2 mg (2 mg Intravenous Given 05/23/21 0754)  HYDROmorphone (DILAUDID) injection 2 mg (2 mg Intravenous Given 05/23/21 05/25/21)    ED Course  I have reviewed the triage vital signs and the  nursing notes.  Pertinent labs & imaging results that were available during my care of the patient were reviewed by me and considered in my medical decision making (see chart for details).    MDM Rules/Calculators/A&P                           BP 121/74    Pulse 78    Temp (!) 97.5 F (36.4 C) (Oral)    Resp (!) 31    Ht 5\' 11"  (1.803 m)    Wt 99.8 kg    SpO2 96%    BMI 30.68 kg/m   Final Clinical Impression(s) / ED Diagnoses Final diagnoses:  Sickle cell anemia with pain (HCC)    Rx / DC Orders ED Discharge Orders     None     Patient here with pain to her lower back and left leg.  Pain similar to prior sickle cell crisis.  On exam, he does have some reproducible pain to his left heel and foot without any significant signs concerning for infectious etiology or DVT.  Initially staff noted that patient was mildly hypoxic with an O2 sats of 89%'s, patient was placed on 2 L of oxygen.  Patient however denies having any significant shortness of breath.  We did turn off the oxygen and monitor his oxygen level and it is at 95% on room air.  I suspect that patient is not truly hypoxic.  I have low suspicion for DVT or PE causing his symptoms.  Will provide treatment for his sickle cell related pain.  No evidence to suggest acute chest.  9:22 AM Patient does have elevated white count of 17 however he is without any infectious symptoms.  I suspect this is likely stress to margination.  His O2 sats is around 90-91%.  Patient denies having any chest pain.  He also mentioned that his O2 sats are usually around that level and that is normal for him.  This could be related  to mild respiratory depression from pain medication.  11:00 AM Pt report feeling much better and comfortable to go home.  He is scheduled to see his hematologist tomorrow.  Return precaution given.     , PA-C 05/23/21 1101    05/25/21, MD 05/24/21 [phone]

## 2021-05-23 NOTE — Discharge Instructions (Signed)
Please follow up with your hematologist for further management of your sickle cell disease.  Return if you have any concerns.  Discuss with your doctor on better pain management regimen.

## 2021-05-23 NOTE — ED Triage Notes (Signed)
Pt reports with sickle cell crisis. Pt complains of pain from his left foot that goes up to his left hip. Pt placed on 2 L nasal cannula (89 O2 saturation).

## 2021-05-24 ENCOUNTER — Other Ambulatory Visit (HOSPITAL_BASED_OUTPATIENT_CLINIC_OR_DEPARTMENT_OTHER): Payer: Self-pay

## 2021-05-24 DIAGNOSIS — G8929 Other chronic pain: Secondary | ICD-10-CM | POA: Diagnosis not present

## 2021-05-24 DIAGNOSIS — D571 Sickle-cell disease without crisis: Secondary | ICD-10-CM | POA: Diagnosis not present

## 2021-05-24 DIAGNOSIS — Q8901 Asplenia (congenital): Secondary | ICD-10-CM | POA: Diagnosis not present

## 2021-05-24 DIAGNOSIS — D564 Hereditary persistence of fetal hemoglobin [HPFH]: Secondary | ICD-10-CM | POA: Diagnosis not present

## 2021-05-24 DIAGNOSIS — E559 Vitamin D deficiency, unspecified: Secondary | ICD-10-CM | POA: Diagnosis not present

## 2021-05-24 MED ORDER — METHADONE HCL 10 MG PO TABS
ORAL_TABLET | ORAL | 0 refills | Status: DC
  Filled 2021-05-29: qty 270, fill #0
  Filled 2021-05-30: qty 270, 30d supply, fill #0
  Filled ????-??-??: fill #0

## 2021-05-24 MED ORDER — OXYCODONE HCL 30 MG PO TABS
30.0000 mg | ORAL_TABLET | Freq: Four times a day (QID) | ORAL | 0 refills | Status: DC | PRN
  Filled 2021-05-29 – 2021-05-30 (×2): qty 120, 30d supply, fill #0
  Filled ????-??-??: fill #0

## 2021-05-24 MED ORDER — VITAMIN D (ERGOCALCIFEROL) 1.25 MG (50000 UNIT) PO CAPS
ORAL_CAPSULE | ORAL | 11 refills | Status: DC
  Filled 2021-05-24: qty 1, 30d supply, fill #0
  Filled 2021-06-27: qty 1, 30d supply, fill #1

## 2021-05-25 ENCOUNTER — Inpatient Hospital Stay (HOSPITAL_COMMUNITY)
Admission: EM | Admit: 2021-05-25 | Discharge: 2021-05-28 | DRG: 871 | Disposition: A | Discharge status: Home / Self Care | Payer: 59 | Attending: Internal Medicine | Admitting: Internal Medicine

## 2021-05-25 ENCOUNTER — Emergency Department (HOSPITAL_COMMUNITY): Payer: 59

## 2021-05-25 ENCOUNTER — Encounter (HOSPITAL_COMMUNITY): Payer: Self-pay | Admitting: Emergency Medicine

## 2021-05-25 ENCOUNTER — Other Ambulatory Visit: Payer: Self-pay

## 2021-05-25 ENCOUNTER — Other Ambulatory Visit (HOSPITAL_BASED_OUTPATIENT_CLINIC_OR_DEPARTMENT_OTHER): Payer: Self-pay

## 2021-05-25 DIAGNOSIS — A419 Sepsis, unspecified organism: Principal | ICD-10-CM | POA: Diagnosis present

## 2021-05-25 DIAGNOSIS — R6520 Severe sepsis without septic shock: Secondary | ICD-10-CM | POA: Diagnosis present

## 2021-05-25 DIAGNOSIS — Z7984 Long term (current) use of oral hypoglycemic drugs: Secondary | ICD-10-CM

## 2021-05-25 DIAGNOSIS — E119 Type 2 diabetes mellitus without complications: Secondary | ICD-10-CM

## 2021-05-25 DIAGNOSIS — Z20822 Contact with and (suspected) exposure to covid-19: Secondary | ICD-10-CM | POA: Diagnosis present

## 2021-05-25 DIAGNOSIS — R0602 Shortness of breath: Secondary | ICD-10-CM | POA: Diagnosis present

## 2021-05-25 DIAGNOSIS — Z885 Allergy status to narcotic agent status: Secondary | ICD-10-CM

## 2021-05-25 DIAGNOSIS — Z86718 Personal history of other venous thrombosis and embolism: Secondary | ICD-10-CM

## 2021-05-25 DIAGNOSIS — D638 Anemia in other chronic diseases classified elsewhere: Secondary | ICD-10-CM | POA: Diagnosis present

## 2021-05-25 DIAGNOSIS — M25572 Pain in left ankle and joints of left foot: Secondary | ICD-10-CM | POA: Diagnosis present

## 2021-05-25 DIAGNOSIS — J9601 Acute respiratory failure with hypoxia: Secondary | ICD-10-CM | POA: Diagnosis present

## 2021-05-25 DIAGNOSIS — J189 Pneumonia, unspecified organism: Secondary | ICD-10-CM | POA: Diagnosis present

## 2021-05-25 DIAGNOSIS — G894 Chronic pain syndrome: Secondary | ICD-10-CM | POA: Diagnosis present

## 2021-05-25 DIAGNOSIS — Z881 Allergy status to other antibiotic agents status: Secondary | ICD-10-CM

## 2021-05-25 DIAGNOSIS — Z79899 Other long term (current) drug therapy: Secondary | ICD-10-CM

## 2021-05-25 DIAGNOSIS — R911 Solitary pulmonary nodule: Secondary | ICD-10-CM | POA: Diagnosis present

## 2021-05-25 DIAGNOSIS — D5700 Hb-SS disease with crisis, unspecified: Secondary | ICD-10-CM | POA: Diagnosis present

## 2021-05-25 LAB — CBC WITH DIFFERENTIAL/PLATELET
Abs Immature Granulocytes: 0.11 10*3/uL — ABNORMAL HIGH (ref 0.00–0.07)
Basophils Absolute: 0.1 10*3/uL (ref 0.0–0.1)
Basophils Relative: 0 %
Eosinophils Absolute: 0.2 10*3/uL (ref 0.0–0.5)
Eosinophils Relative: 1 %
HCT: 28.8 % — ABNORMAL LOW (ref 39.0–52.0)
Hemoglobin: 10.2 g/dL — ABNORMAL LOW (ref 13.0–17.0)
Immature Granulocytes: 1 %
Lymphocytes Relative: 25 %
Lymphs Abs: 3.6 10*3/uL (ref 0.7–4.0)
MCH: 32.1 pg (ref 26.0–34.0)
MCHC: 35.4 g/dL (ref 30.0–36.0)
MCV: 90.6 fL (ref 80.0–100.0)
Monocytes Absolute: 2.9 10*3/uL — ABNORMAL HIGH (ref 0.1–1.0)
Monocytes Relative: 20 %
Neutro Abs: 7.6 10*3/uL (ref 1.7–7.7)
Neutrophils Relative %: 53 %
Platelets: 317 10*3/uL (ref 150–400)
RBC: 3.18 MIL/uL — ABNORMAL LOW (ref 4.22–5.81)
RDW: 30.9 % — ABNORMAL HIGH (ref 11.5–15.5)
WBC: 14.4 10*3/uL — ABNORMAL HIGH (ref 4.0–10.5)
nRBC: 10.2 % — ABNORMAL HIGH (ref 0.0–0.2)

## 2021-05-25 LAB — RESP PANEL BY RT-PCR (FLU A&B, COVID) ARPGX2
Influenza A by PCR: NEGATIVE
Influenza B by PCR: NEGATIVE
SARS Coronavirus 2 by RT PCR: NEGATIVE

## 2021-05-25 LAB — RETICULOCYTES
Immature Retic Fract: 35.4 % — ABNORMAL HIGH (ref 2.3–15.9)
RBC.: 3.17 MIL/uL — ABNORMAL LOW (ref 4.22–5.81)
Retic Count, Absolute: 725 10*3/uL — ABNORMAL HIGH (ref 19.0–186.0)
Retic Ct Pct: 22.3 % — ABNORMAL HIGH (ref 0.4–3.1)

## 2021-05-25 LAB — COMPREHENSIVE METABOLIC PANEL
ALT: 43 U/L (ref 0–44)
AST: 68 U/L — ABNORMAL HIGH (ref 15–41)
Albumin: 4.1 g/dL (ref 3.5–5.0)
Alkaline Phosphatase: 86 U/L (ref 38–126)
Anion gap: 7 (ref 5–15)
BUN: 8 mg/dL (ref 6–20)
CO2: 26 mmol/L (ref 22–32)
Calcium: 8.8 mg/dL — ABNORMAL LOW (ref 8.9–10.3)
Chloride: 104 mmol/L (ref 98–111)
Creatinine, Ser: 0.68 mg/dL (ref 0.61–1.24)
GFR, Estimated: >60 mL/min (ref >60)
Glucose, Bld: 110 mg/dL — ABNORMAL HIGH (ref 70–99)
Potassium: 4.4 mmol/L (ref 3.5–5.1)
Sodium: 137 mmol/L (ref 135–145)
Total Bilirubin: 4 mg/dL — ABNORMAL HIGH (ref 0.3–1.2)
Total Protein: 8.3 g/dL — ABNORMAL HIGH (ref 6.5–8.1)

## 2021-05-25 IMAGING — DX DG ANKLE COMPLETE 3+V*L*
3 series · 3 of 3 positions shown · non-contrast
Comparison: None.

CLINICAL DATA: Lateral pain and swelling

EXAM:
LEFT ANKLE COMPLETE - 3+ VIEW

[ankle ap]
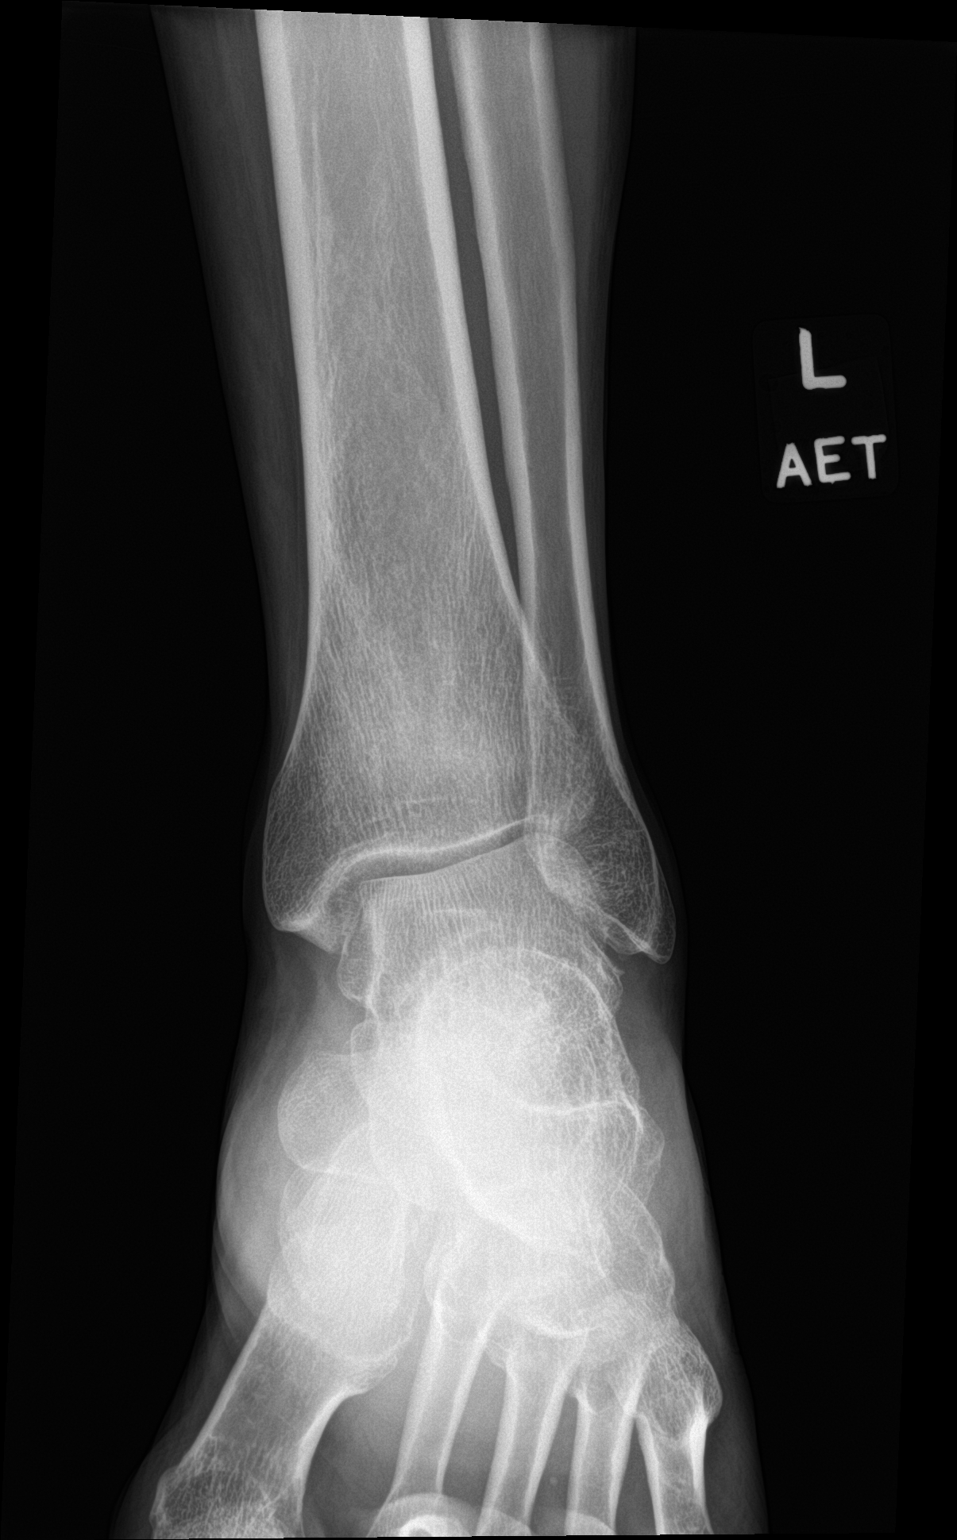

[ankle obl]
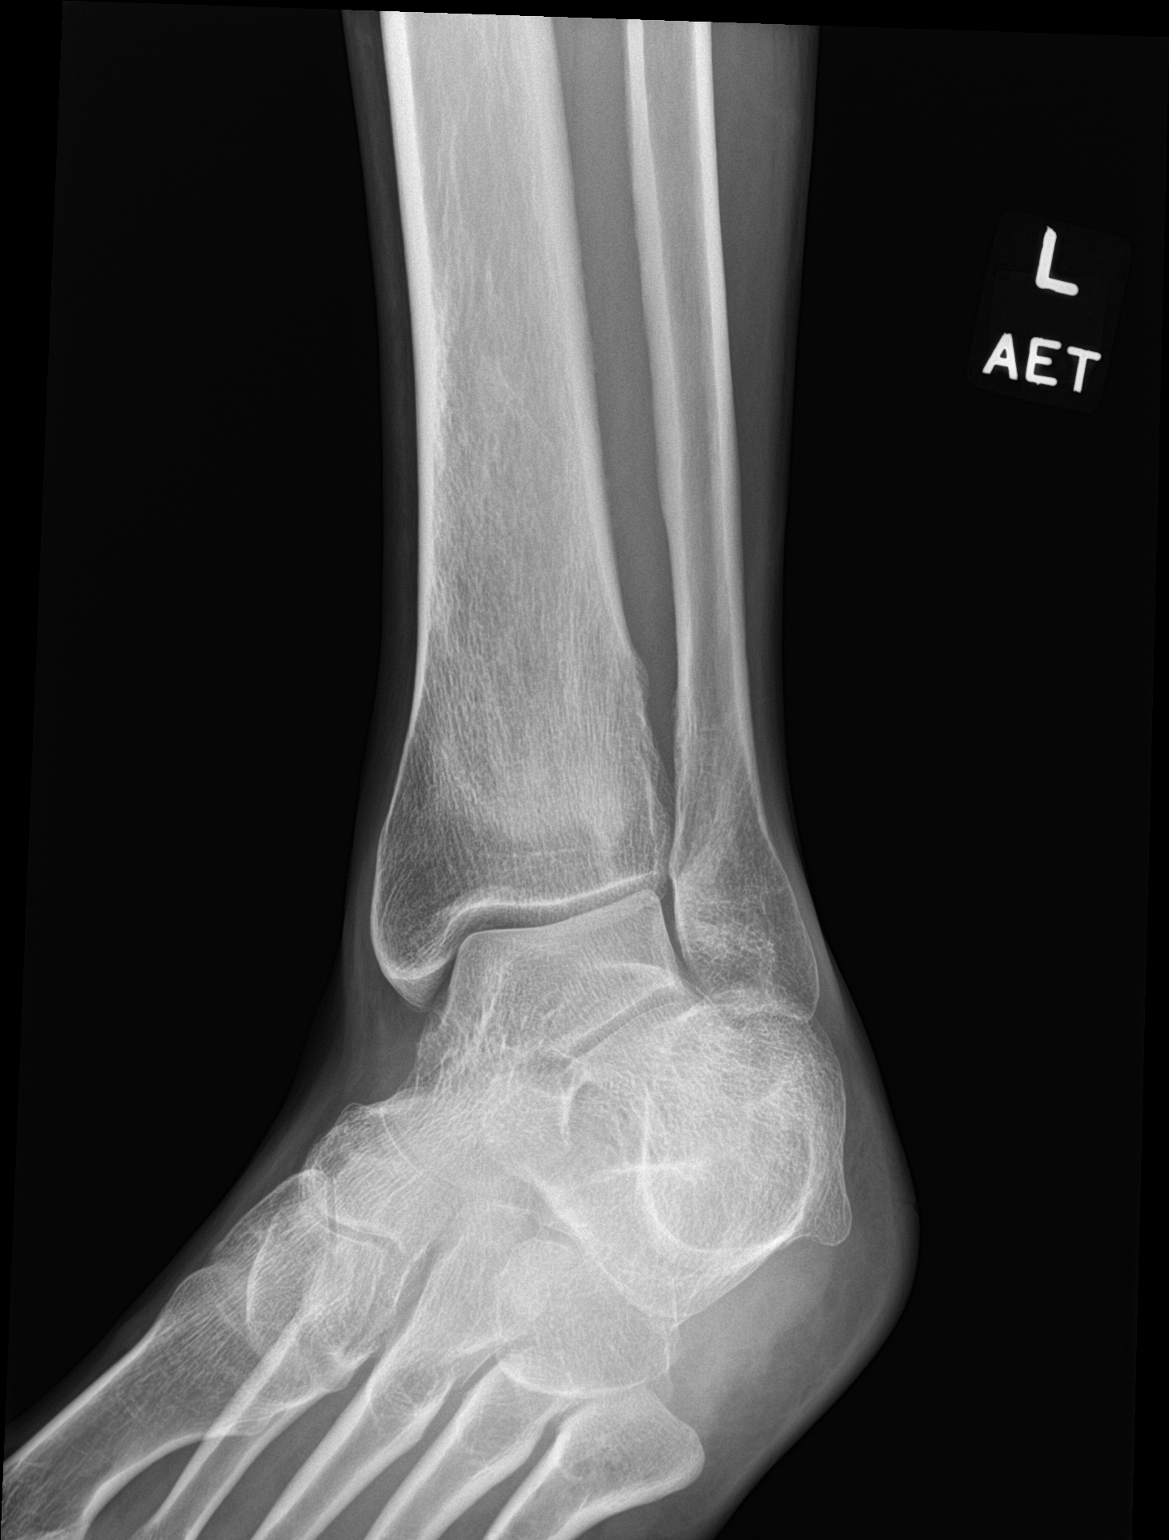

[ankle lat]
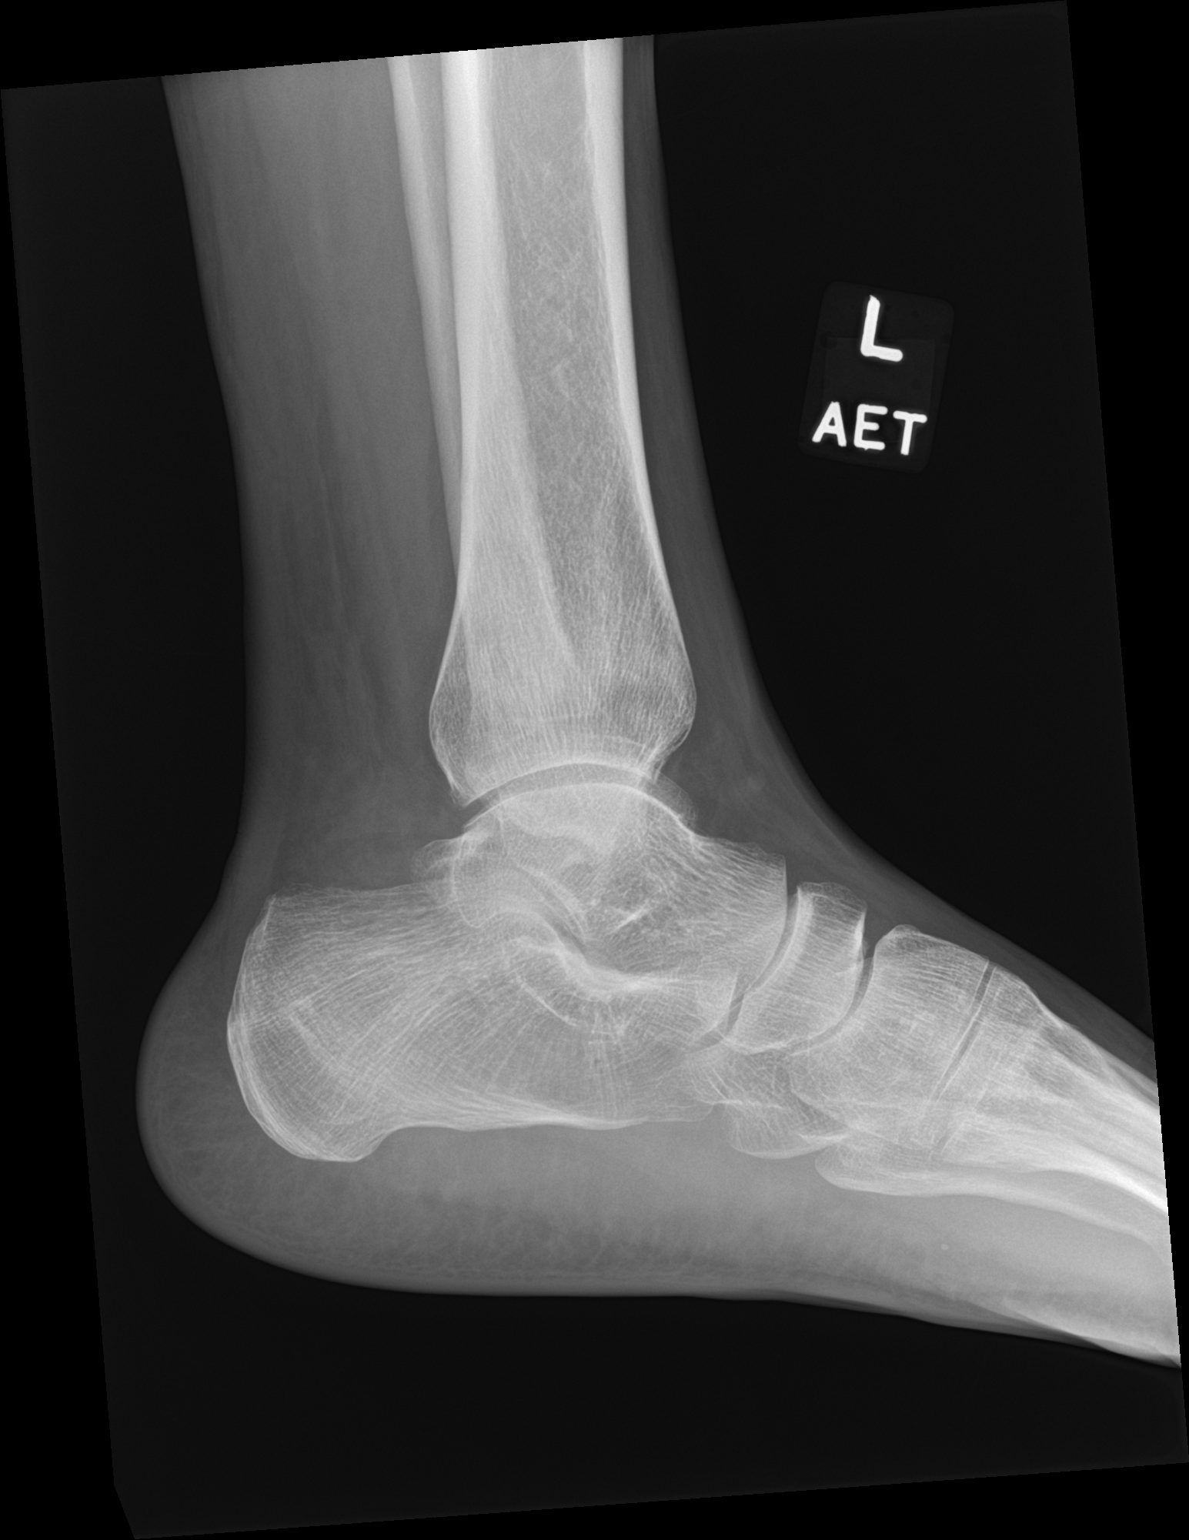

[3 of 3 positions shown; findings below may reference images not displayed]

FINDINGS: There is no evidence of fracture, dislocation, or joint effusion.
There is no evidence of arthropathy or other focal bone abnormality.
Soft tissues are unremarkable.
IMPRESSION: Negative.

## 2021-05-25 IMAGING — DX DG CHEST 1V PORT
1 series · 1 of 1 positions shown · non-contrast
Comparison: [DATE]

CLINICAL DATA: Hypoxia.  Sickle cell crisis.

EXAM:
PORTABLE CHEST 1 VIEW

[chest ap]
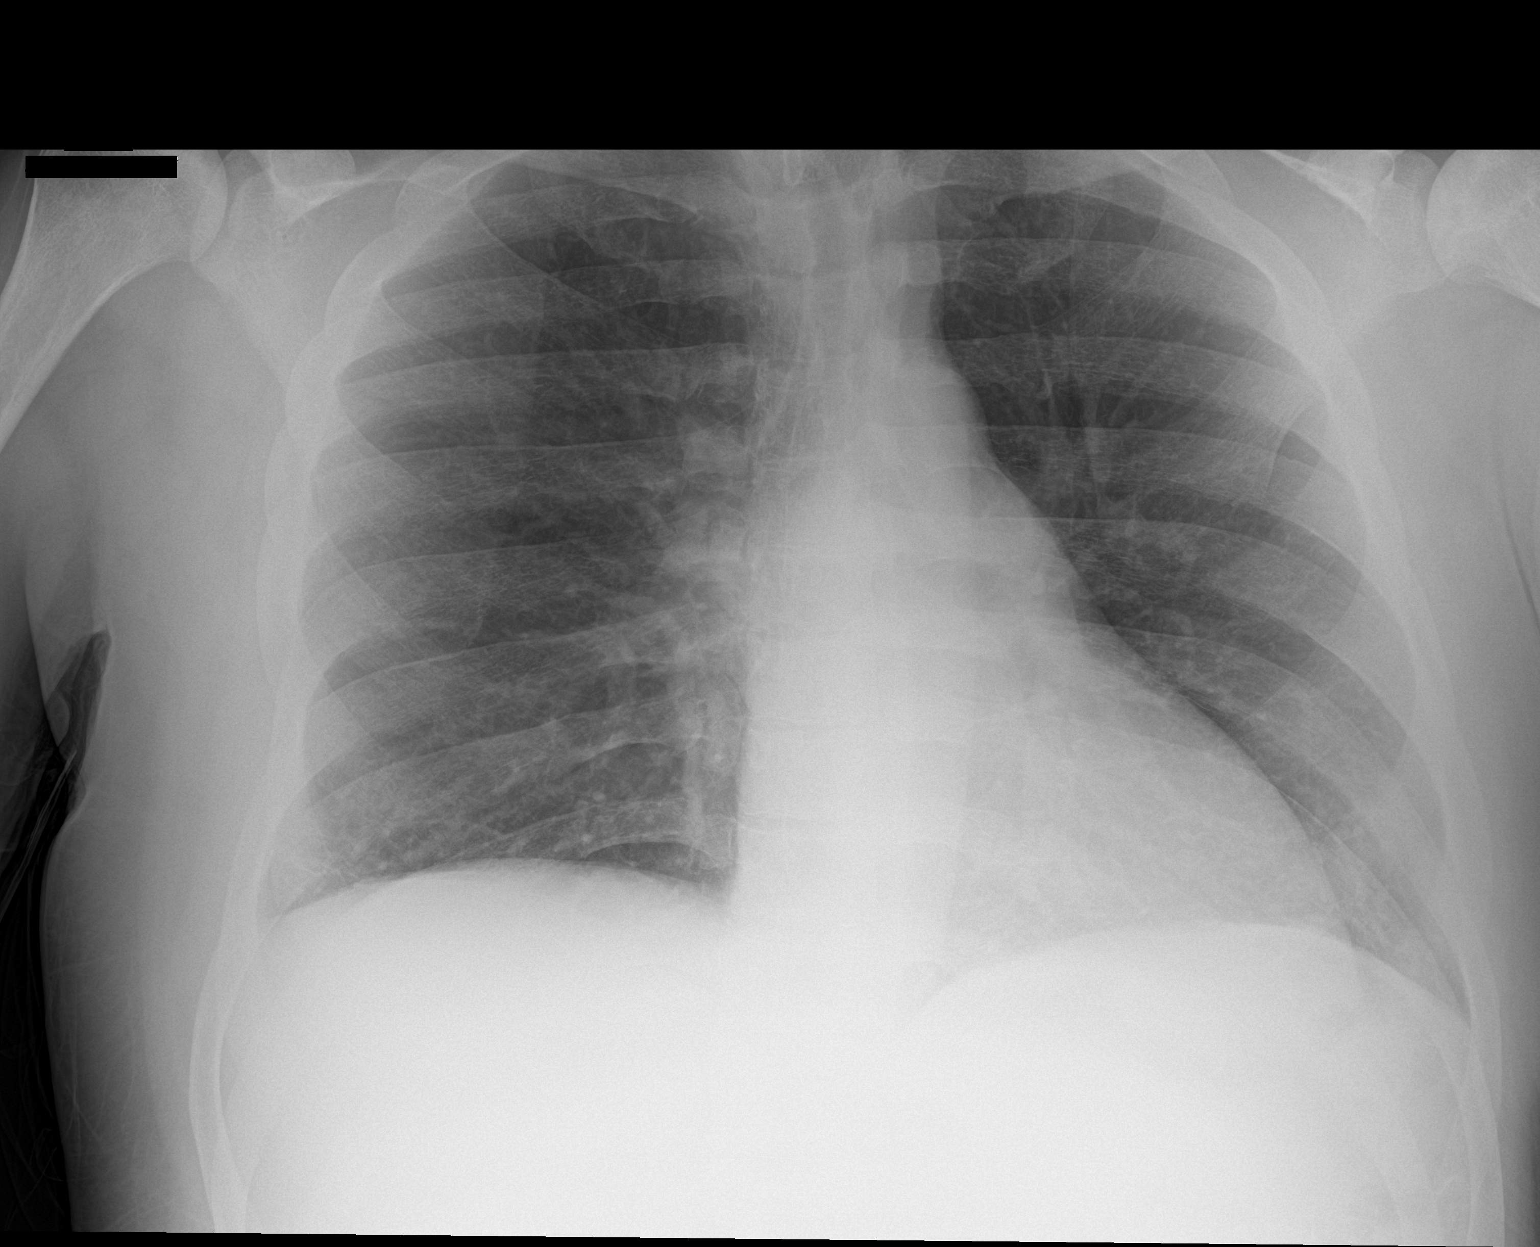

[1 of 1 positions shown; findings below may reference images not displayed]

FINDINGS: The heart size and mediastinal contours are within normal limits.
Both lungs are clear. The visualized skeletal structures are
unremarkable.
IMPRESSION: No active disease.

## 2021-05-25 MED ORDER — DIPHENHYDRAMINE HCL 25 MG PO CAPS
25.0000 mg | ORAL_CAPSULE | ORAL | Status: DC | PRN
  Administered 2021-05-25 – 2021-05-26 (×3): 50 mg via ORAL
  Administered 2021-05-28: 14:00:00 25 mg via ORAL
  Filled 2021-05-25: qty 2
  Filled 2021-05-25: qty 1
  Filled 2021-05-25 (×2): qty 2

## 2021-05-25 MED ORDER — HYDROMORPHONE HCL 2 MG/ML IJ SOLN
2.0000 mg | INTRAMUSCULAR | Status: AC
  Administered 2021-05-25: 2 mg via INTRAVENOUS
  Filled 2021-05-25: qty 1

## 2021-05-25 MED ORDER — OXYCODONE HCL 5 MG PO TABS
30.0000 mg | ORAL_TABLET | Freq: Once | ORAL | Status: AC
  Administered 2021-05-25: 30 mg via ORAL
  Filled 2021-05-25: qty 6

## 2021-05-25 MED ORDER — KETOROLAC TROMETHAMINE 15 MG/ML IJ SOLN
15.0000 mg | Freq: Once | INTRAMUSCULAR | Status: AC
  Administered 2021-05-25: 15 mg via INTRAVENOUS
  Filled 2021-05-25: qty 1

## 2021-05-25 NOTE — ED Notes (Signed)
Patients oxygen dropped to 86% room air, 2L [place] placed on patient, oxygen increased to 92%.

## 2021-05-25 NOTE — ED Triage Notes (Signed)
Patient arrives complaining of a SCC that began Sunday. Patient states he has been unable to get his pain under control. Patient noted to be hypoxic at 885 RA on arrival. Patient noted to be distressed.

## 2021-05-25 NOTE — ED Provider Notes (Signed)
Patient care assumed at 2300.  Patient with sickle cell here for evaluation of pain.  Care assumed pending reassessment after pain medications.  On assessment patient states that his pain is improved but he does have persistent hypoxia with a 2 L oxygen requirement.  On ED presentation he was also hypoxic with sats in the 80s.  He does have a remote history of DVT that was unprovoked in July 2021.  He was treated with a course of anticoagulation for 3 months.  Given his history a D-dimer was obtained, which was elevated.  CTA was obtained, which is negative for PE but does demonstrate some infiltrates in the bases, possible pneumonia versus acute chest.  On reassessment patient is sleepy but does feel improved.  Multiple attempts to wean off oxygen and patient has recurrent hypoxia.  Will treat with antibiotics, admitted for possible acute chest versus pneumonia.  Patient is in agreement with plan.  Hospitalist consulted for admission.   Tilden Fossa, MD 05/26/21 [phone]

## 2021-05-25 NOTE — ED Provider Notes (Signed)
[facility]-EMERGENCY DEPT Provider Note   CSN: [id number] Arrival date & time: 05/25/21  1920     History Chief Complaint  Patient presents with   Sickle Cell Pain Crisis    Michael Mullins is a 40 y.o. male.  HPI 40 year old male presents with severe left ankle pain.  This started about 5 days ago.  He has had pain like this before from sickle cell pain crisis.  He notes some swelling and pain inferior to his left ankle.  No fevers, chest pain, shortness of breath, cough.  He was here couple days ago, treated and improved and then saw his hematologist yesterday.  Now however he is having worsening pain.  He has had pain in this area before.  He had previous ulcerations that have healed.  Of note, he was noted to be hypoxic to 88% on room air on arrival.  However when I took him off oxygen in the room he remained stable at 93% and above.  Past Medical History:  Diagnosis Date   Diabetes mellitus without complication (HCC)    Sickle cell anemia (HCC)     Patient Active Problem List   Diagnosis Date Noted   Sickle cell pain crisis (HCC) 01/23/2021   Ankle ulcer (HCC) 01/23/2021   Controlled type 2 diabetes mellitus with hyperglycemia (HCC) 01/23/2021   FOOT ULCER 01/14/2009   CHEST PAIN 01/14/2009    History reviewed. No pertinent surgical history.     No family history on file.  Social History   Tobacco Use   Smoking status: Never   Smokeless tobacco: Never  Substance Use Topics   Alcohol use: Not Currently   Drug use: Not Currently    Home Medications Prior to Admission medications   Medication Sig Start Date End Date Taking? Authorizing Provider  collagenase (SANTYL) ointment Apply to the affected area topically daily. 01/26/21   Mullins Maroon, FNP  empagliflozin (JARDIANCE) 10 MG TABS tablet Take 1 tablet (10 mg total) by mouth daily. 11/03/20     ergocalciferol (VITAMIN D2) 1.25 MG (50000 UT) capsule Take 1 capsule by mouth every 30  days 08/07/20     ferrous sulfate (FEROSUL) 325 (65 FE) MG tablet Take by mouth. Patient taking differently: Take 325 mg by mouth every other day. 08/07/20     folic acid (FOLVITE) 1 MG tablet Take 1 tablet by mouth daily 08/07/20     lisinopril (ZESTRIL) 2.5 MG tablet Take 1 tablet (2.5 mg total) by mouth daily. 11/03/20     methadone (DOLOPHINE) 10 MG tablet Take 3 tablets by mouth 3 times daily. 05/24/21     naloxone (NARCAN) nasal spray 4 mg/0.1 mL Use as directed every 2-3 minutes until emergency arrives, call 911 08/07/20     ondansetron (ZOFRAN) 4 MG tablet Take 1 tablet (4 mg total) by mouth every 8 (eight) hours as needed for Nausea / Vomiting. 02/22/21     OXBRYTA 500 MG TABS tablet Take 1,500 mg by mouth daily. 01/01/21   [provider]  oxycodone (ROXICODONE) 30 MG immediate release tablet Take 1 tablet by mouth every 6 hours as needed for Pain. 05/24/21     triamcinolone cream (KENALOG) 0.1 % Apply to affected skin of arms twice daily as needed. NEVER to face/neck/groin. Patient not taking: No sig reported 11/28/20     Vitamin D, Ergocalciferol, (DRISDOL) 1.25 MG (50000 UNIT) CAPS capsule Take 1 capsule by mouth every 30 days 05/24/21  Allergies    Morphine and Vancomycin  Review of Systems   Review of Systems  Constitutional:  Negative for fever.  Respiratory:  Negative for cough and shortness of breath.   Cardiovascular:  Negative for chest pain.  Musculoskeletal:  Positive for arthralgias and joint swelling.  Skin:  Negative for color change.  All other systems reviewed and are negative.  Physical Exam Updated Vital Signs BP (!) 156/118 (BP Location: Right Arm)    Pulse (!) 120    Temp 97.9 F (36.6 C) (Oral)    Resp (!) 22    Ht 5\' 11"  (1.803 m)    Wt 99.8 kg    SpO2 (!) 88%    BMI 30.68 kg/m   Physical Exam Vitals and nursing note reviewed.  Constitutional:      General: He is in acute distress (in pain).     Appearance: He is well-developed.  HENT:      Head: Normocephalic and atraumatic.     Right Ear: External ear normal.     Left Ear: External ear normal.     Nose: Nose normal.  Eyes:     General:        Right eye: No discharge.        Left eye: No discharge.  Cardiovascular:     Rate and Rhythm: Regular rhythm. Tachycardia present.     Pulses:          Dorsalis pedis pulses are 2+ on the left side.     Heart sounds: Normal heart sounds.  Pulmonary:     Effort: Pulmonary effort is normal.     Breath sounds: Normal breath sounds.  Abdominal:     Palpations: Abdomen is soft.     Tenderness: There is no abdominal tenderness.  Musculoskeletal:     Cervical back: Neck supple.     Left ankle: Swelling (mild, inferior to lateral malleolus) present. Tenderness present over the lateral malleolus (just inferior to medial malleolus). Decreased range of motion.     Comments: No erythema or increased warmth to left ankle. Chronic healed wounds.  Skin:    General: Skin is warm and dry.  Neurological:     Mental Status: He is alert.  Psychiatric:        Mood and Affect: Mood is not anxious.    ED Results / Procedures / Treatments   Labs (all labs ordered are listed, but only abnormal results are displayed) Labs Reviewed  RESP PANEL BY RT-PCR (FLU A&B, COVID) [id number]  CBC WITH DIFFERENTIAL/PLATELET  RETICULOCYTES  COMPREHENSIVE METABOLIC PANEL    EKG None  Radiology DG Chest Port 1 View  Result Date: 05/25/2021 CLINICAL DATA:  Hypoxia.  Sickle cell crisis. EXAM: PORTABLE CHEST 1 VIEW COMPARISON:  January 04, 2020 FINDINGS: The heart size and mediastinal contours are within normal limits. Both lungs are clear. The visualized skeletal structures are unremarkable. IMPRESSION: No active disease. Electronically Signed   By: January 06, 2020 III M.D.   On: 05/25/2021 19:55    Procedures Procedures   Medications Ordered in ED Medications  HYDROmorphone (DILAUDID) injection 2 mg (has no administration in time range)  HYDROmorphone  (DILAUDID) injection 2 mg (has no administration in time range)  diphenhydrAMINE (BENADRYL) capsule 25-50 mg (has no administration in time range)    ED Course  I have reviewed the triage vital signs and the nursing notes.  Pertinent labs & imaging results that were available during my care of the patient were reviewed by  me and considered in my medical decision making (see chart for details).    MDM Rules/Calculators/A&P                         Patient was questionably hypoxic in triage, but here is ok on room air prior to pain meds. It seems like this is a recurrent sickle cell pain crisis in his ankle/leg. He is doing better, though seems worried he might come back if discharged. We decided to give his home oxycodone, observe 1-2 hours, and discharge or admit based on re-assessment. Care transferred to Dr. Madilyn Hook    Final Clinical Impression(s) / ED Diagnoses Final diagnoses:  Sickle cell pain crisis [facility])    Rx / DC Orders ED Discharge Orders     None        Pricilla Loveless, MD 05/25/21 2306

## 2021-05-25 NOTE — ED Provider Notes (Signed)
Emergency Medicine Provider Triage Evaluation Note  Michael Mullins , a 40 y.o. male  was evaluated in triage.  Pt complains of sickle cell crisis. Has had pain to the left ankle/foot for 5 days. Seen in the ed earlier this week and had improvement of sxs. Saw hematology yesterday and was still doing fairly well but sxs recurred over the last 24 hours. Sxs are unresolved with home meds. Sats are borderline. Denies chest pain, sob.  Review of Systems  Positive: Left foot/ankle pain Negative: Chest pain, sob  Physical Exam  BP (!) 156/118 (BP Location: Right Arm)    Pulse (!) 120    Temp 97.9 F (36.6 C) (Oral)    Resp (!) 22    Ht 5\' 11"  (1.803 m)    Wt 99.8 kg    SpO2 (!) 88%    BMI 30.68 kg/m  Gen:   Awake, appears uncomfortable   Resp:  Normal effort  MSK:   Moves extremities without difficulty  Other:  Tachycardic, ttp to the left ankle with some mild warmth, dp pulse intact.  Medical Decision Making  Medically screening exam initiated at 7:39 PM.  Appropriate orders placed.  Michael Mullins was informed that the remainder of the evaluation will be completed by another provider, this initial triage assessment does not replace that evaluation, and the importance of remaining in the ED until their evaluation is complete.     Harvie Bridge 05/25/21 1941    05/27/21, MD 05/25/21 [phone]

## 2021-05-26 ENCOUNTER — Encounter (HOSPITAL_COMMUNITY): Payer: Self-pay

## 2021-05-26 ENCOUNTER — Emergency Department (HOSPITAL_COMMUNITY): Payer: 59

## 2021-05-26 DIAGNOSIS — J189 Pneumonia, unspecified organism: Secondary | ICD-10-CM | POA: Diagnosis present

## 2021-05-26 DIAGNOSIS — E119 Type 2 diabetes mellitus without complications: Secondary | ICD-10-CM

## 2021-05-26 DIAGNOSIS — J9601 Acute respiratory failure with hypoxia: Secondary | ICD-10-CM | POA: Diagnosis not present

## 2021-05-26 DIAGNOSIS — R911 Solitary pulmonary nodule: Secondary | ICD-10-CM | POA: Diagnosis not present

## 2021-05-26 DIAGNOSIS — M25572 Pain in left ankle and joints of left foot: Secondary | ICD-10-CM | POA: Diagnosis not present

## 2021-05-26 DIAGNOSIS — A419 Sepsis, unspecified organism: Secondary | ICD-10-CM | POA: Diagnosis not present

## 2021-05-26 DIAGNOSIS — R0602 Shortness of breath: Secondary | ICD-10-CM | POA: Diagnosis present

## 2021-05-26 DIAGNOSIS — R6520 Severe sepsis without septic shock: Secondary | ICD-10-CM | POA: Diagnosis not present

## 2021-05-26 DIAGNOSIS — D5700 Hb-SS disease with crisis, unspecified: Secondary | ICD-10-CM | POA: Diagnosis not present

## 2021-05-26 LAB — CBC WITH DIFFERENTIAL/PLATELET
Abs Immature Granulocytes: 0.08 10*3/uL — ABNORMAL HIGH (ref 0.00–0.07)
Basophils Absolute: 0 10*3/uL (ref 0.0–0.1)
Basophils Relative: 0 %
Eosinophils Absolute: 0.2 10*3/uL (ref 0.0–0.5)
Eosinophils Relative: 2 %
HCT: 26.6 % — ABNORMAL LOW (ref 39.0–52.0)
Hemoglobin: 9.5 g/dL — ABNORMAL LOW (ref 13.0–17.0)
Immature Granulocytes: 1 %
Lymphocytes Relative: 33 %
Lymphs Abs: 4.7 10*3/uL — ABNORMAL HIGH (ref 0.7–4.0)
MCH: 32.1 pg (ref 26.0–34.0)
MCHC: 35.7 g/dL (ref 30.0–36.0)
MCV: 89.9 fL (ref 80.0–100.0)
Monocytes Absolute: 2.7 10*3/uL — ABNORMAL HIGH (ref 0.1–1.0)
Monocytes Relative: 18 %
Neutro Abs: 6.7 10*3/uL (ref 1.7–7.7)
Neutrophils Relative %: 46 %
Platelets: 274 10*3/uL (ref 150–400)
RBC: 2.96 MIL/uL — ABNORMAL LOW (ref 4.22–5.81)
RDW: 29.8 % — ABNORMAL HIGH (ref 11.5–15.5)
WBC: 14.4 10*3/uL — ABNORMAL HIGH (ref 4.0–10.5)
nRBC: 9.2 % — ABNORMAL HIGH (ref 0.0–0.2)

## 2021-05-26 LAB — COMPREHENSIVE METABOLIC PANEL
ALT: 37 U/L (ref 0–44)
AST: 48 U/L — ABNORMAL HIGH (ref 15–41)
Albumin: 3.9 g/dL (ref 3.5–5.0)
Alkaline Phosphatase: 76 U/L (ref 38–126)
Anion gap: 7 (ref 5–15)
BUN: 8 mg/dL (ref 6–20)
CO2: 27 mmol/L (ref 22–32)
Calcium: 8.6 mg/dL — ABNORMAL LOW (ref 8.9–10.3)
Chloride: 103 mmol/L (ref 98–111)
Creatinine, Ser: 0.72 mg/dL (ref 0.61–1.24)
GFR, Estimated: >60 mL/min (ref >60)
Glucose, Bld: 100 mg/dL — ABNORMAL HIGH (ref 70–99)
Potassium: 4 mmol/L (ref 3.5–5.1)
Sodium: 137 mmol/L (ref 135–145)
Total Bilirubin: 3.6 mg/dL — ABNORMAL HIGH (ref 0.3–1.2)
Total Protein: 7.7 g/dL (ref 6.5–8.1)

## 2021-05-26 LAB — BLOOD GAS, VENOUS
Acid-Base Excess: 2 mmol/L (ref 0.0–2.0)
Bicarbonate: 27.6 mmol/L (ref 20.0–28.0)
O2 Saturation: 87.4 %
Patient temperature: 98.6
pCO2, Ven: 51.6 mmHg (ref 44.0–60.0)
pH, Ven: 7.348 (ref 7.250–7.430)
pO2, Ven: 72 mmHg — ABNORMAL HIGH (ref 32.0–45.0)

## 2021-05-26 LAB — CBG MONITORING, ED
Glucose-Capillary: 89 mg/dL (ref 70–99)
Glucose-Capillary: 152 mg/dL — ABNORMAL HIGH (ref 70–99)
Glucose-Capillary: 101 mg/dL — ABNORMAL HIGH (ref 70–99)
Glucose-Capillary: 105 mg/dL — ABNORMAL HIGH (ref 70–99)

## 2021-05-26 LAB — RETICULOCYTES
Immature Retic Fract: 38.5 % — ABNORMAL HIGH (ref 2.3–15.9)
RBC.: 2.94 MIL/uL — ABNORMAL LOW (ref 4.22–5.81)
Retic Count, Absolute: 545 10*3/uL — ABNORMAL HIGH (ref 19.0–186.0)
Retic Ct Pct: 18.8 % — ABNORMAL HIGH (ref 0.4–3.1)

## 2021-05-26 LAB — ABO/RH: ABO/RH(D): A POS

## 2021-05-26 LAB — MAGNESIUM: Magnesium: 2.2 mg/dL (ref 1.7–2.4)

## 2021-05-26 LAB — URINALYSIS, COMPLETE (UACMP) WITH MICROSCOPIC
Bacteria, UA: NONE SEEN
Bilirubin Urine: NEGATIVE
Glucose, UA: 50 mg/dL — AB
Hgb urine dipstick: NEGATIVE
Ketones, ur: NEGATIVE mg/dL
Leukocytes,Ua: NEGATIVE
Nitrite: NEGATIVE
Protein, ur: NEGATIVE mg/dL
Specific Gravity, Urine: 1.024 (ref 1.005–1.030)
pH: 6 (ref 5.0–8.0)

## 2021-05-26 LAB — RAPID URINE DRUG SCREEN, HOSP PERFORMED
Amphetamines: NOT DETECTED
Barbiturates: NOT DETECTED
Benzodiazepines: NOT DETECTED
Cocaine: NOT DETECTED
Opiates: POSITIVE — AB
Tetrahydrocannabinol: NOT DETECTED

## 2021-05-26 LAB — PHOSPHORUS: Phosphorus: 4.1 mg/dL (ref 2.5–4.6)

## 2021-05-26 LAB — TYPE AND SCREEN
ABO/RH(D): A POS
Antibody Screen: NEGATIVE

## 2021-05-26 LAB — D-DIMER, QUANTITATIVE: D-Dimer, Quant: 2.96 ug/mL-FEU — ABNORMAL HIGH (ref 0.00–0.50)

## 2021-05-26 LAB — TROPONIN I (HIGH SENSITIVITY)
Troponin I (High Sensitivity): 5 ng/L (ref <18)
Troponin I (High Sensitivity): 5 ng/L (ref <18)

## 2021-05-26 LAB — BRAIN NATRIURETIC PEPTIDE: B Natriuretic Peptide: 80.9 pg/mL (ref 0.0–100.0)

## 2021-05-26 LAB — BILIRUBIN, DIRECT: Bilirubin, Direct: 0.8 mg/dL — ABNORMAL HIGH (ref 0.0–0.2)

## 2021-05-26 LAB — PROCALCITONIN: Procalcitonin: 0.17 ng/mL

## 2021-05-26 LAB — STREP PNEUMONIAE URINARY ANTIGEN: Strep Pneumo Urinary Antigen: NEGATIVE

## 2021-05-26 LAB — LACTIC ACID, PLASMA: Lactic Acid, Venous: 0.9 mmol/L (ref 0.5–1.9)

## 2021-05-26 IMAGING — CT CT ANGIO CHEST
1 of 6 series · 18 of 36 positions shown · IV contrast (omnipaque)
Comparison: [DATE]

CLINICAL DATA: Pulmonary embolism (PE) suspected, positive D-dimer.
Sickle cell disease. History of pulmonary embolism.

EXAM:
CT ANGIOGRAPHY CHEST WITH CONTRAST
TECHNIQUE: Multidetector CT imaging of the chest was performed using the
standard protocol during bolus administration of intravenous
contrast. Multiplanar CT image reconstructions and MIPs were
obtained to evaluate the vascular anatomy.
CONTRAST:  75mL OMNIPAQUE IOHEXOL 350 MG/ML SOLN

[Series 5: thins · axial · 0.82mm/px · z∈[-285,-49]mm · 18 of 266 slices shown]
[im 15/266  lung]
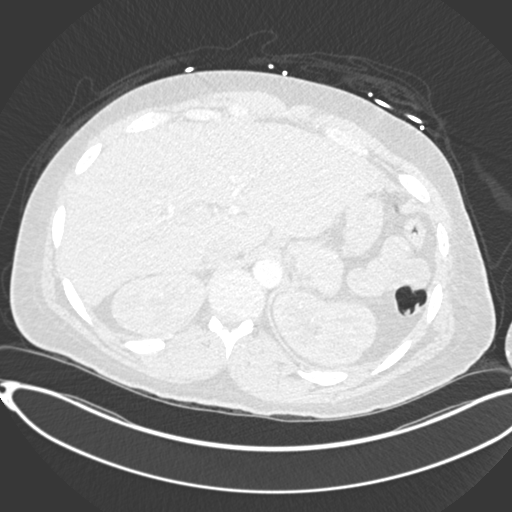
[im 30/266  mediastinal]
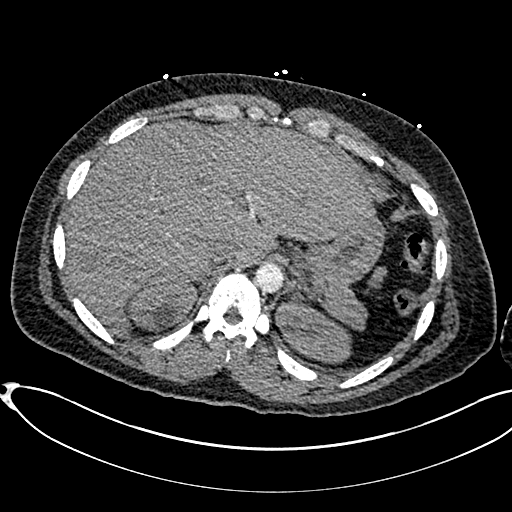
[im 45/266  lung]
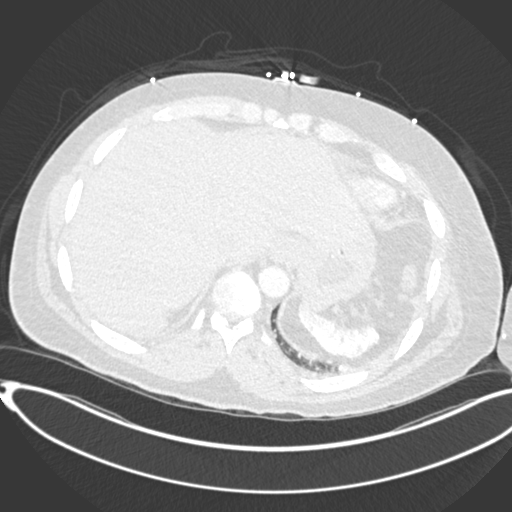
[im 59/266  mediastinal]
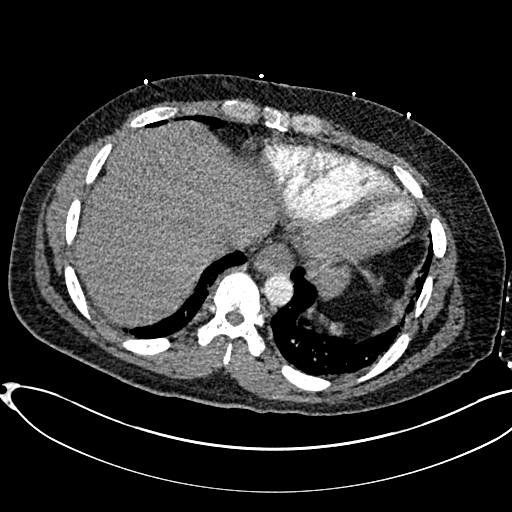
[im 74/266  lung]
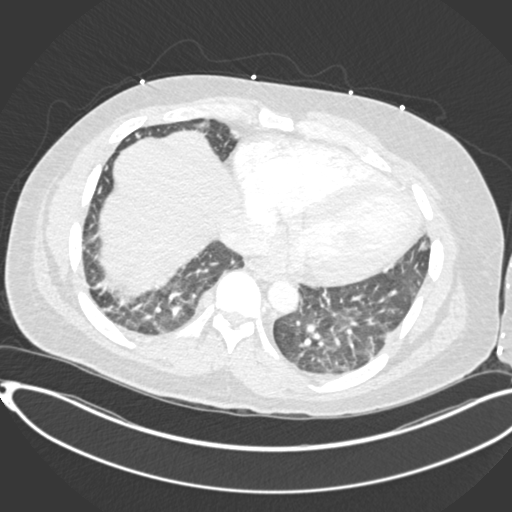
[im 89/266  mediastinal]
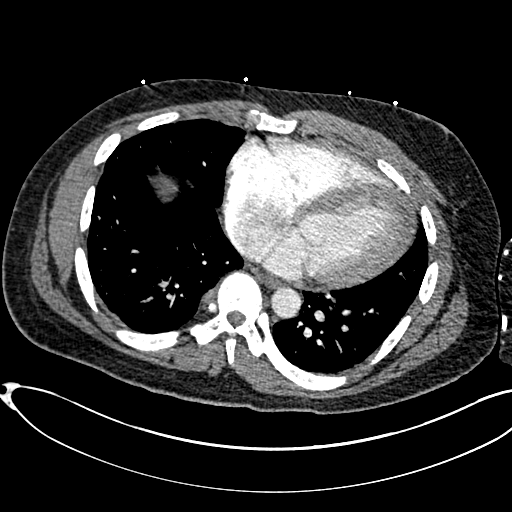
[im 104/266  lung]
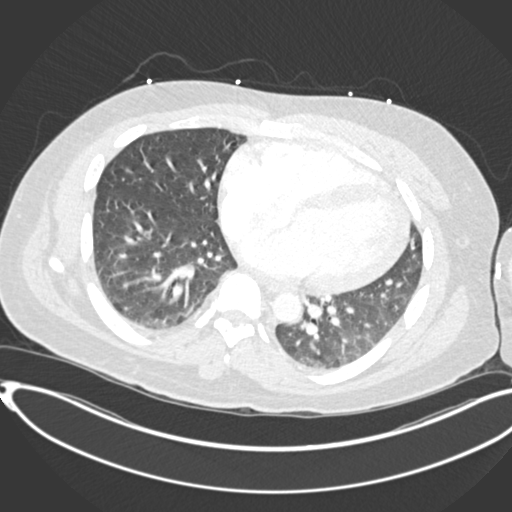
[im 118/266  mediastinal]
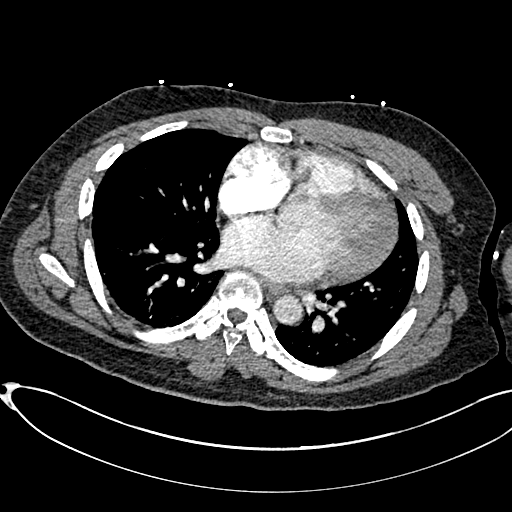
[im 133/266  lung]
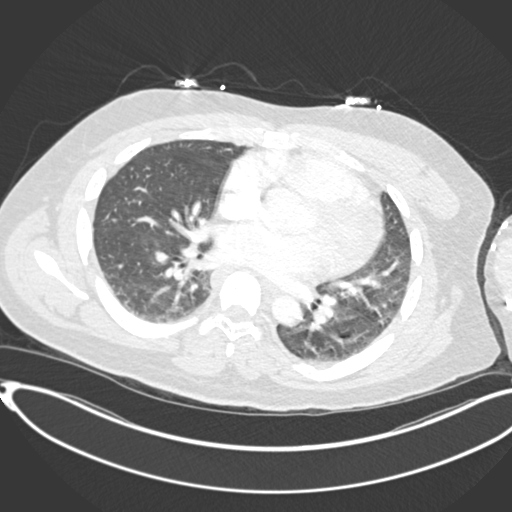
[im 135/266  mediastinal]
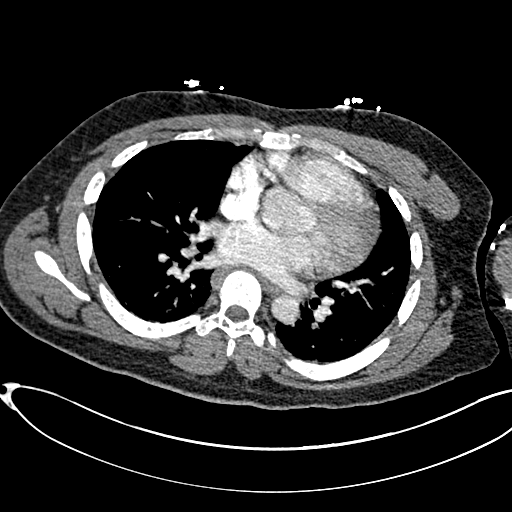
[im 148/266  lung]
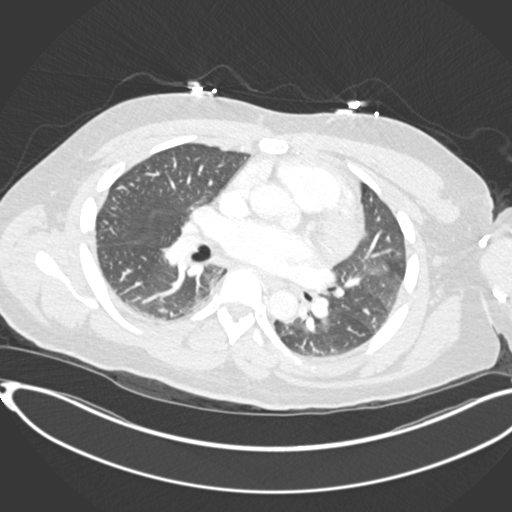
[im 162/266  mediastinal]
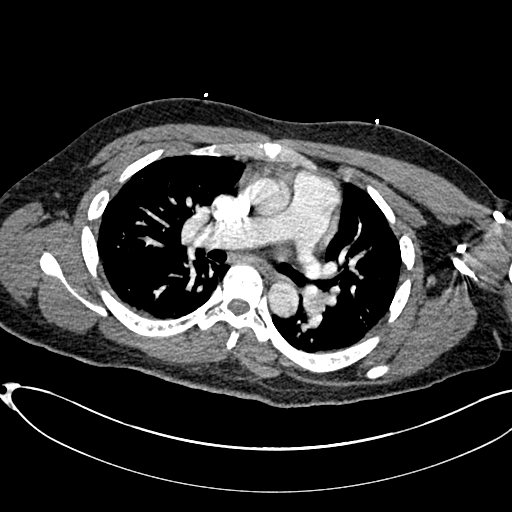
[im 177/266  lung]
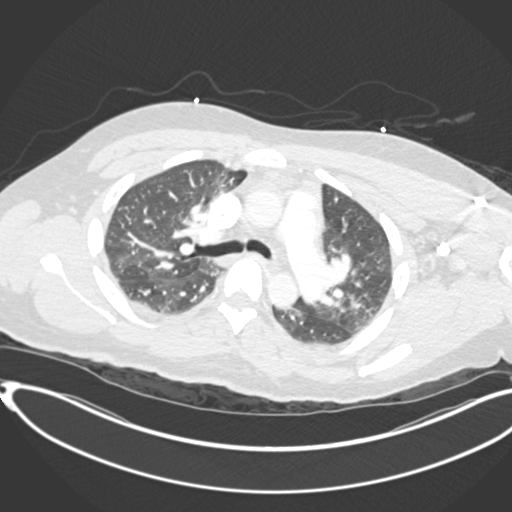
[im 192/266  mediastinal]
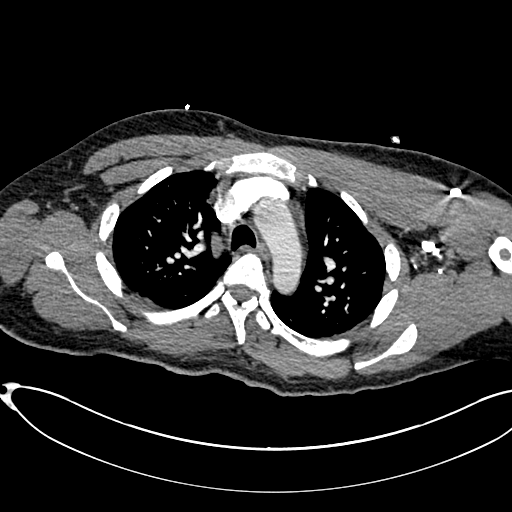
[im 207/266  lung]
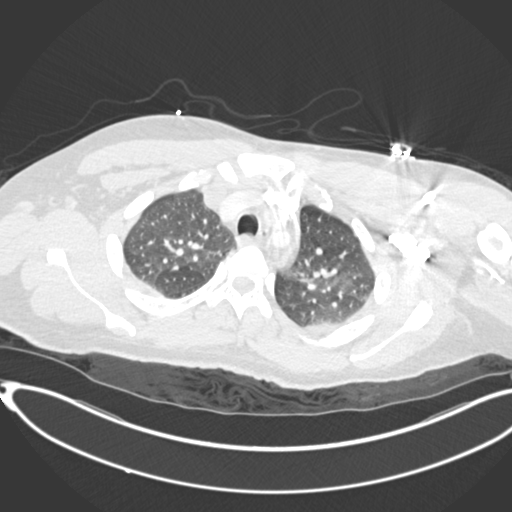
[im 221/266  mediastinal]
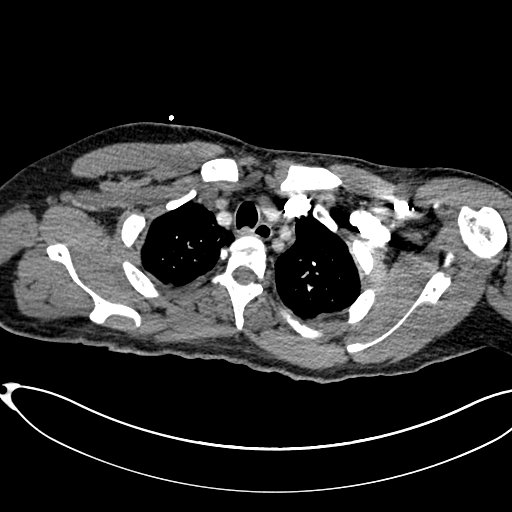
[im 236/266  lung]
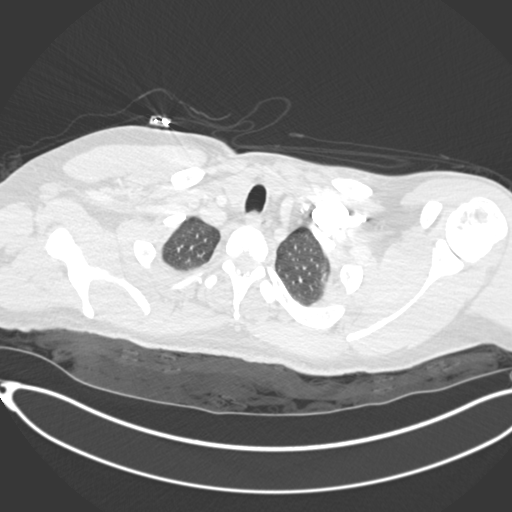
[im 251/266  mediastinal]
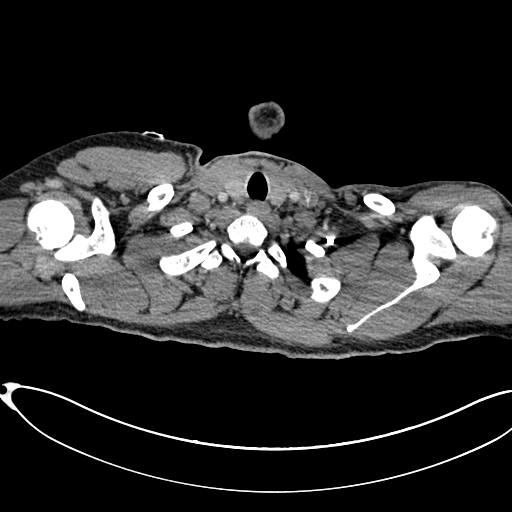

[18 of 36 positions shown; findings below may reference images not displayed]

FINDINGS: Cardiovascular: Adequate opacification of the pulmonary arterial
tree. No intraluminal filling defect identified to suggest acute
pulmonary embolism. Central pulmonary arteries are of normal
caliber. No significant coronary artery calcification. Cardiac size
is mildly enlarged, stable since prior examination. No pericardial
effusion. No significant atherosclerotic calcification within the
thoracic aorta. No aortic aneurysm.

Mediastinum/Nodes: No enlarged mediastinal, hilar, or axillary lymph
nodes. Thyroid gland, trachea, and esophagus demonstrate no
significant findings.

Lungs/Pleura: Linear atelectasis noted at the lung bases
bilaterally. Superimposed ground-glass opacity at the lung bases may
represent atelectasis or mild infiltrate. 6 mm noncalcified
pulmonary nodule noted within the subpleural right lower lobe, axial
image # 46/6. No pneumothorax or pleural effusion.

Upper Abdomen: Auto infarction of the spleen.  No acute abnormality.

Musculoskeletal: Avascular necrosis of the humeral heads bilaterally
without associated subarticular collapse. Osseous structures are
diffusely sclerotic in keeping with chronic changes of sickle cell
disease. No acute bone abnormality.

Review of the MIP images confirms the above findings.
IMPRESSION: No pulmonary embolism.

Stable cardiomegaly.

Bibasilar ground-glass pulmonary opacity may represent subtle
infiltrate in the setting of acute chest syndrome.

6 mm right solid pulmonary nodule. Recommend a non-contrast Chest CT
at 6-12 months. If patient is high risk for malignancy, recommend an
additional non-contrast Chest CT at 18-24 months; if patient is low
risk for malignancy a non-contrast Chest CT at 18-24 months is
optional.

These guidelines do not apply to immunocompromised patients and
patients with cancer. Follow up in patients with significant
comorbidities as clinically warranted. For lung cancer screening,
adhere to Lung-RADS guidelines. Reference: Radiology. [HC];
284(1):228-43.

Chronic infarction of the spleen and osseous changes in keeping with
sickle cell disease.

## 2021-05-26 MED ORDER — SODIUM CHLORIDE 0.9 % IV SOLN
1.0000 g | Freq: Once | INTRAVENOUS | Status: AC
  Administered 2021-05-26: 1 g via INTRAVENOUS
  Filled 2021-05-26: qty 10

## 2021-05-26 MED ORDER — INSULIN ASPART 100 UNIT/ML IJ SOLN
0.0000 [IU] | Freq: Three times a day (TID) | INTRAMUSCULAR | Status: DC
  Administered 2021-05-27: 13:00:00 1 [IU] via SUBCUTANEOUS
  Administered 2021-05-28: 10:00:00 2 [IU] via SUBCUTANEOUS
  Filled 2021-05-26: qty 0.09

## 2021-05-26 MED ORDER — NALOXONE HCL 0.4 MG/ML IJ SOLN: 0.4000 mg | INTRAMUSCULAR | Status: DC | PRN

## 2021-05-26 MED ORDER — SODIUM CHLORIDE 0.45 % IV SOLN: INTRAVENOUS | Status: AC

## 2021-05-26 MED ORDER — HYDROMORPHONE HCL 1 MG/ML IJ SOLN
1.0000 mg | INTRAMUSCULAR | Status: DC | PRN
  Administered 2021-05-26 (×3): 1 mg via INTRAVENOUS
  Filled 2021-05-26 (×3): qty 1

## 2021-05-26 MED ORDER — ACETAMINOPHEN 325 MG PO TABS
650.0000 mg | ORAL_TABLET | Freq: Four times a day (QID) | ORAL | Status: DC | PRN
  Administered 2021-05-27: 13:00:00 650 mg via ORAL
  Filled 2021-05-26: qty 2

## 2021-05-26 MED ORDER — SODIUM CHLORIDE 0.9 % IV SOLN
500.0000 mg | INTRAVENOUS | Status: DC
  Administered 2021-05-27 – 2021-05-28 (×2): 500 mg via INTRAVENOUS
  Filled 2021-05-26 (×2): qty 5

## 2021-05-26 MED ORDER — IOHEXOL 350 MG/ML SOLN
75.0000 mL | Freq: Once | INTRAVENOUS | Status: AC | PRN
  Administered 2021-05-26: 75 mL via INTRAVENOUS

## 2021-05-26 MED ORDER — KETOROLAC TROMETHAMINE 15 MG/ML IJ SOLN
15.0000 mg | Freq: Four times a day (QID) | INTRAMUSCULAR | Status: DC | PRN
  Administered 2021-05-26 – 2021-05-27 (×2): 15 mg via INTRAVENOUS
  Filled 2021-05-26 (×2): qty 1

## 2021-05-26 MED ORDER — ONDANSETRON HCL 4 MG/2ML IJ SOLN
4.0000 mg | Freq: Three times a day (TID) | INTRAMUSCULAR | Status: DC | PRN
  Administered 2021-05-26: 4 mg via INTRAVENOUS
  Filled 2021-05-26: qty 2

## 2021-05-26 MED ORDER — HYDROMORPHONE HCL 1 MG/ML IJ SOLN
0.5000 mg | INTRAMUSCULAR | Status: DC | PRN
  Administered 2021-05-26: 0.5 mg via INTRAVENOUS
  Filled 2021-05-26: qty 1

## 2021-05-26 MED ORDER — AZITHROMYCIN 250 MG PO TABS
500.0000 mg | ORAL_TABLET | Freq: Once | ORAL | Status: AC
  Administered 2021-05-26: 500 mg via ORAL
  Filled 2021-05-26: qty 2

## 2021-05-26 MED ORDER — SODIUM CHLORIDE 0.9 % IV SOLN
1.0000 g | INTRAVENOUS | Status: DC
  Administered 2021-05-27 – 2021-05-28 (×2): 1 g via INTRAVENOUS
  Filled 2021-05-26 (×2): qty 10

## 2021-05-26 MED ORDER — ACETAMINOPHEN 650 MG RE SUPP: 650.0000 mg | Freq: Four times a day (QID) | RECTAL | Status: DC | PRN

## 2021-05-26 MED ORDER — HYDROMORPHONE HCL 2 MG/ML IJ SOLN
2.0000 mg | INTRAMUSCULAR | Status: AC | PRN
  Administered 2021-05-26 – 2021-05-27 (×4): 2 mg via INTRAVENOUS
  Filled 2021-05-26 (×4): qty 1

## 2021-05-26 MED ORDER — OXYCODONE HCL 5 MG PO TABS
15.0000 mg | ORAL_TABLET | ORAL | Status: DC | PRN
  Administered 2021-05-26 – 2021-05-27 (×2): 15 mg via ORAL
  Filled 2021-05-26 (×2): qty 3

## 2021-05-26 NOTE — H&P (Signed)
History and Physical    PLEASE NOTE THAT DRAGON DI[employer] SOFTWARE WAS USED IN THE CONSTRUCTION OF THIS NOTE.   [name] EXB:[id number] DOB: 12-02-1980 DOA: 05/25/2021  PCP: Tawni Pummel, MD  Patient coming from: home   I have personally briefly reviewed patient's old medical records in [place]  Chief Complaint: Left ankle pain  HPI: Michael Mullins is a 40 y.o. male with medical history significant for sickle cell anemia with baseline hemoglobin range 10-11 prior sickle cell pain crises, type 2 diabetes mellitus, who is admitted to [facility] on 05/25/2021 with acute hypoxic respiratory distress in the setting of suspected community-acquired pneumonia after presenting from home to [facility] ED complaining of left ankle pain.   The patient acknowledges a history of sickle cell anemia, complicated by prior sickle cell pain crises, resulting in multiple previous hospitalizations thereof.  He reports that his typical distribution of pain experienced at times of previous sickle cell pain crises is left ankle pain, with which he presents to [facility] emergency department today, noting 2-day duration of circumferential left ankle pain.  Describes the pain is sharp, nonradiating, and constant in nature, worsening with movement, including dorsiflexion/plantarflexion of the left foot.  Denies any preceding trauma.  Not associate with any new lower extremity erythema, edema, or calf tenderness.  Denies any additional arthralgias or myalgias.  Per chart review, his baseline hemoglobin ranges 10-11.   He notes 2 days of associated shortness of breath, associated with new onset nonproductive cough.  Shortness of breath has not been associate with any orthopnea, PND, hemoptysis, or wheezing.  Denies any associated subjective fever, chills, rigors, or generalized myalgias.  He also denies any recent chest pain, palpitations, diaphoresis, dizziness, presyncope, or syncope.  Denies  any associated nausea, vomiting, diarrhea, rash, abdominal pain headache, neck stiffness.  He also denies any recent dysuria or gross hematuria.  Denies any recent or recurrent alcohol consumption, no history of recreational drug use, and conveys that he is a lifelong non-smoker.  Denies a chronic known underlying pulmonary pathology, and denies any known baseline supplemental oxygen requirement.  Sliding persistent left ankle pain in spite of outpatient analgesic intervention, including home methadone and prn oxycodone IR, patient presented to [facility] emergency department today for further evaluation management of left ankle discomfort.     ED Course:  Vital signs in the ED were notable for the following: Temperature max 97.9; heart rate 68-90; blood pressure 101/88 -128/67; respiratory rate 15-22, oxygen saturation initially noted to be 83 to 86% on room air, with ensuing improvement to 90 to 94% on 2 L nasal cannula  Labs were notable for the following: CMP notable for the following: Creatinine 0.68, glucose 110, albumin 4.1, alkaline phosphatase 86, AST 68, ALT 83, total bilirubin 4.0.  CBC notable for white cell count 14,400, hemoglobin 10.2 compared to most recent prior hemoglobin data point of 10.3 on 05/23/2021, platelet count 317, reticulocyte count percentage 22.3.  D-dimer 2.96.  COVID-19/influenza PCR were checked in the ED today and found to be negative.  Imaging and additional notable ED work-up: EKG shows sinus rhythm with heart rate 73, normal intervals, and no evidence of T wave or ST changes, including no evidence of ST elevation.  Chest x-ray showed no evidence of acute cardiopulmonary process.  Plain films of the left ankle showed no evidence of acute fracture, dislocation, or joint effusion, and also showed no evidence of soft tissue abnormality.  CTA chest showed no  evidence of acute pulmonary embolism, will demonstrating bibasilar airspace opacities suggestive of infiltrate, in  the absence of evidence of edema, effusion, or pneumothorax.  CTA chest also showed evidence of 6 mm right solid pulmonary nodule, with associated radiology recommendation for follow-up noncontrast CT chest in 6 to 12 months.  While in the ED, the following were administered: Following Dilaudid 2 mg IV x2, Toradol 15 mg IV x1 and oxycodone IR 30 mg p.o. x1, patient reported significant provement in his presenting left ankle discomfort; he also received the following: Azithromycin, Rocephin.  The patient denies any pain pain control as relates to his left ankle discomfort, he is being admitted for further evaluation and management of acute hypoxic respiratory distress in the setting of suspected community-acquired pneumonia.      Review of Systems: As per HPI otherwise 10 point review of systems negative.   Past Medical History:  Diagnosis Date   Diabetes mellitus without complication (HCC)    Sickle cell anemia ([place])     History reviewed. No pertinent surgical history.  Social History:  reports that he has never smoked. He has never used smokeless tobacco. He reports that he does not currently use alcohol. He reports that he does not currently use drugs.   Allergies  Allergen Reactions   Morphine Hives    Tolerates hydromorphone per MAR   Vancomycin Itching    Tolerates with longer infusion times and premedcation with IV bendryl   Family history reviewed and not pertinent    Prior to Admission medications   Medication Sig Start Date End Date Taking? Authorizing Provider  ACCUTANE 40 MG capsule Take 40 mg by mouth daily. 04/30/21  Yes [provider]  empagliflozin (JARDIANCE) 10 MG TABS tablet Take 1 tablet (10 mg total) by mouth daily. 5/32/99  Yes   folic acid (FOLVITE) 1 MG tablet Take 1 tablet by mouth daily Patient taking differently: Take 1 mg by mouth daily. 08/07/20  Yes   lisinopril (ZESTRIL) 2.5 MG tablet Take 1 tablet (2.5 mg total) by mouth daily. 11/03/20  Yes    methadone (DOLOPHINE) 10 MG tablet Take 3 tablets by mouth 3 times daily. Patient taking differently: Take 10 mg by mouth every 8 (eight) hours. 05/24/21  Yes   ondansetron (ZOFRAN) 4 MG tablet Take 1 tablet (4 mg total) by mouth every 8 (eight) hours as needed for Nausea / Vomiting. 02/22/21  Yes   OXBRYTA 500 MG TABS tablet Take 1,500 mg by mouth daily. 01/01/21  Yes [provider]  oxycodone (ROXICODONE) 30 MG immediate release tablet Take 1 tablet by mouth every 6 hours as needed for Pain. Patient taking differently: Take 30 mg by mouth every 6 (six) hours as needed for pain. 05/24/21  Yes   Vitamin D, Ergocalciferol, (DRISDOL) 1.25 MG (50000 UNIT) CAPS capsule Take 1 capsule by mouth every 30 days Patient taking differently: Take 50,000 Units by mouth every 30 (thirty) days. 05/24/21  Yes   collagenase (SANTYL) ointment Apply to the affected area topically daily. Patient not taking: Reported on 05/25/2021 01/26/21   Dorena Dew, FNP  ergocalciferol (VITAMIN D2) 1.25 MG (50000 UT) capsule Take 1 capsule by mouth every 30 days Patient not taking: Reported on 05/25/2021 08/07/20     ferrous sulfate (FEROSUL) 325 (65 FE) MG tablet Take by mouth. Patient not taking: Reported on 05/25/2021 08/07/20     naloxone [facility]) nasal spray 4 mg/0.1 mL Use as directed every 2-3 minutes until emergency arrives, call  [phone] Patient not taking: Reported on 05/25/2021 08/07/20     triamcinolone cream (KENALOG) 0.1 % Apply to affected skin of arms twice daily as needed. NEVER to face/neck/groin. Patient not taking: Reported on 01/19/2021 11/28/20        Objective    Physical Exam: Vitals:   05/26/21 0030 05/26/21 0100 05/26/21 0130 05/26/21 0300  BP: 115/65 101/88 118/66 119/70  Pulse: 69 76 77 70  Resp: 13 13 15 14   Temp:      TempSrc:      SpO2: 94% 93% 95% 90%  Weight:      Height:        General: appears to be stated age; alert, oriented Skin: warm, dry, no rash Head:   AT/ Mouth:  Oral mucosa membranes appear dry, normal dentition Neck: supple; trachea midline Heart:  RRR; did not appreciate any M/R/G Lungs: CTAB, did not appreciate any wheezes, rales, or rhonchi Abdomen: + BS; soft, ND, NT Vascular: 2+ pedal pulses b/l; 2+ radial pulses b/l Extremities: no peripheral edema, no muscle wasting Neuro: strength and sensation intact in upper and lower extremities b/l    Labs on Admission: I have personally reviewed following labs and imaging studies  CBC: Recent Labs  Lab 05/23/21 0748 05/25/21 2104  WBC 17.6* 14.4*  NEUTROABS 10.4* 7.6  HGB 10.3* 10.2*  HCT 28.6* 28.8*  MCV 86.7 90.6  PLT 328 384   Basic Metabolic Panel: Recent Labs  Lab 05/23/21 0748 05/25/21 2104  NA 136 137  K 4.2 4.4  CL 102 104  CO2 25 26  GLUCOSE 121* 110*  BUN 14 8  CREATININE 0.60* 0.68  CALCIUM 9.1 8.8*   GFR: Estimated Creatinine Clearance: 149.2 mL/min (by C-G formula based on SCr of 0.68 mg/dL). Liver Function Tests: Recent Labs  Lab 05/25/21 2104  AST 68*  ALT 43  ALKPHOS 86  BILITOT 4.0*  PROT 8.3*  ALBUMIN 4.1   No results for input(s): LIPASE, AMYLASE in the last 168 hours. No results for input(s): AMMONIA in the last 168 hours. Coagulation Profile: No results for input(s): INR, PROTIME in the last 168 hours. Cardiac Enzymes: No results for input(s): CKTOTAL, CKMB, CKMBINDEX, TROPONINI in the last 168 hours. BNP (last 3 results) No results for input(s): PROBNP in the last 8760 hours. HbA1C: No results for input(s): HGBA1C in the last 72 hours. CBG: No results for input(s): GLUCAP in the last 168 hours. Lipid Profile: No results for input(s): CHOL, HDL, LDLCALC, TRIG, CHOLHDL, LDLDIRECT in the last 72 hours. Thyroid Function Tests: No results for input(s): TSH, T4TOTAL, FREET4, T3FREE, THYROIDAB in the last 72 hours. Anemia Panel: Recent Labs    05/23/21 0748 05/25/21 2104  RETICCTPCT 17.8* 22.3*   Urine analysis: No results  found for: COLORURINE, APPEARANCEUR, LABSPEC, PHURINE, GLUCOSEU, HGBUR, BILIRUBINUR, KETONESUR, PROTEINUR, UROBILINOGEN, NITRITE, LEUKOCYTESUR  Radiological Exams on Admission: DG Ankle Complete Left  Result Date: 05/25/2021 CLINICAL DATA:  Lateral pain and swelling EXAM: LEFT ANKLE COMPLETE - 3+ VIEW COMPARISON:  None. FINDINGS: There is no evidence of fracture, dislocation, or joint effusion. There is no evidence of arthropathy or other focal bone abnormality. Soft tissues are unremarkable. IMPRESSION: Negative. Electronically Signed   By: Donavan Foil M.D.   On: 05/25/2021 21:10   CT Angio Chest PE W/Cm &/Or Wo Cm  Result Date: 05/26/2021 CLINICAL DATA:  Pulmonary embolism (PE) suspected, positive D-dimer. Sickle cell disease. History of pulmonary embolism. EXAM: CT ANGIOGRAPHY CHEST WITH CONTRAST TECHNIQUE: Multidetector CT imaging  of the chest was performed using the standard protocol during bolus administration of intravenous contrast. Multiplanar CT image reconstructions and MIPs were obtained to evaluate the vascular anatomy. CONTRAST:  7m OMNIPAQUE IOHEXOL 350 MG/ML SOLN COMPARISON:  11/06/2006 FINDINGS: Cardiovascular: Adequate opacification of the pulmonary arterial tree. No intraluminal filling defect identified to suggest acute pulmonary embolism. Central pulmonary arteries are of normal caliber. No significant coronary artery calcification. Cardiac size is mildly enlarged, stable since prior examination. No pericardial effusion. No significant atherosclerotic calcification within the thoracic aorta. No aortic aneurysm. Mediastinum/Nodes: No enlarged mediastinal, hilar, or axillary lymph nodes. Thyroid gland, trachea, and esophagus demonstrate no significant findings. Lungs/Pleura: Linear atelectasis noted at the lung bases bilaterally. Superimposed ground-glass opacity at the lung bases may represent atelectasis or mild infiltrate. 6 mm noncalcified pulmonary nodule noted within the  subpleural right lower lobe, axial image # 46/6. No pneumothorax or pleural effusion. 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  No acute abnormality. Musculoskeletal: Avascular necrosis of the humeral heads bilaterally without associated subarticular collapse. Osseous structures are diffusely sclerotic in keeping with chronic changes of sickle cell disease. No acute bone abnormality. Review of the MIP images confirms the above findings. IMPRESSION: No pulmonary embolism. Stable cardiomegaly. Bibasilar ground-glass pulmonary opacity may represent subtle infiltrate in the setting of acute chest syndrome. 6 mm right solid pulmonary nodule. Recommend a non-contrast Chest CT at 6-12 months. If patient is high risk for malignancy, recommend an additional non-contrast Chest CT at 18-24 months; if patient is low risk for malignancy a non-contrast Chest CT at 18-24 months is optional. These guidelines do not apply to immunocompromised patients and patients with cancer. Follow up in patients with significant comorbidities as clinically warranted. For lung cancer screening, adhere to Lung-RADS guidelines. Reference: Radiology. 2017; 284(1):228-43. 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 Electronically Signed   By: AFidela SalisburyM.D.   On: 05/26/2021 02:00   DG Chest Port 1 View  Result Date: 05/25/2021 CLINICAL DATA:  Hypoxia.  Sickle cell crisis. EXAM: PORTABLE CHEST 1 VIEW COMPARISON:  January 04, 2020 FINDINGS: The heart size and mediastinal contours are within normal limits. Both lungs are clear. The visualized skeletal structures are unremarkable. IMPRESSION: No active disease. Electronically Signed   By: DDorise BullionIII M.D.   On: 05/25/2021 19:55     EKG: Independently reviewed, with result as described above.    Assessment/Plan    Principal Problem:   Acute respiratory failure with hypoxia (HCC) Active Problems:   SOB (shortness of breath)   CAP  (community acquired pneumonia)   Severe sepsis ([place]   Left ankle pain   Pulmonary nodule   Diabetes mellitus without complication ([place]      #) Acute hypoxic respiratory distress: In the context of no known baseline supplemental oxygen requirements and no chronic underlying pulmonary pathology, initial oxygen saturations noted to be 83 to 86% on room air, with ensuing improvement to 90 to 94% on 2 L nasal cannula.  In the context of presenting 2 days of shortness of breath, this presentation meets criteria for acute hypoxic respiratory distress, and is suspected to be on the basis of community-acquired pneumonia related CTA chest showing bibasilar airspace opacities concerning for infiltrate, will demonstrating no evidence of acute pulmonary embolism, edema, effusion, or pneumothorax.  No clinical or radiographic evidence to suggest acute decompensated heart failure.  No overt evidence of ACS, including no recent chest pain, will EKG shows no evidence  of acute ischemic changes.  However, given presenting left ankle pain typical of his sickle cell pain crises, with CTA showing bibasilar infiltrates, differential also includes acute chest syndrome although clinically this appears less likely at the present time.  Regardless, no clinical indication for initiation of transfusion exchange at this time, including presenting hemoglobin at baseline. of note, COVID-19/influenza PCR found to be negative.  Plan: Monitor continuous pulse oximetry.  Check serum magnesium and phosphorus levels.  Contents from a tree.  Further evaluation management of suspected severe sepsis due to community-acquired pneumonia, as further detailed below, including IV antibiotics.  Check BNP, VBG, trend troponin.  Monitor on telemetry.  Type and screen via sickle cell protocol.  Repeat reticulocyte count.  CBC in the morning.       #) Severe sepsis due to suspected community-acquired pneumonia: Diagnosis on the basis of 2 days  of new onset shortness of breath associated with new onset nonproductive cough, with CTA chest showing evidence of bibasilar airspace opacities consistent with infiltrate.  Suspect poss underlying infectious process may have been provoking feature leading to initial evaluation of left ankle discomfort concerning for sickle cell pain crisis initially.  SIRS criteria met via leukocytosis, tachypnea. Of note, given the associated presence of suspected end organ damage in the form of concominant presenting acute hypoxic respiratory distress, criteria are met for pt's sepsis to be considered severe in nature. Will check stat lactic acid level.  No evidence of associated hypotension.  In the absence of lactic acid level that is greater than or equal to 4.0, and in the absence of any associated hypotension refractory to IVF's, there are no indications for administration of a 30 mL/kg IVF bolus at this time.   In the ED today, patient received Rocephin and azithromycin, which will be continued for CAP coverage.  COVID-19/influenza PCR negative.   Plan: CBC w/ diff in AM.  Stat lactic acid.  Gentle IV fluids in the form half NS, with half NS elected in setting of initial concerns for sickle cell pain crisis.  Check blood cultures x2.  Continue azithromycin/Rocephin.  Check urinalysis.  Add on procalcitonin.  NSAIDs [place].  Check strep pneumoniae urine antigen, Legionella urine antigen, mycoplasma antibodies.       #) Acute left ankle pain: Patient presented with 2 days of circumferential left ankle pain, consistent with prior episodes of sickle cell pain crisis.  Adequate pain control achieved in the ED today following multiple IV doses of analgesia, as outlined above.  Was intended to be discharged from the ED but unable to do so in the setting of finding of acute hypoxic respiratory distress, as above.  Overall, does not appear to be in overt sickle cell pain crisis at this time, but will closely monitor  ensuing evidence of development of such, we will closely monitor for adequacy of associated pain control.  Of note, plain films of the left ankle today show no evidence of acute process, as further detailed above.   Plan: Prn IV Dilaudid.  Prn IV Toradol.  Repeat CBC in the morning.  Repeat reticulocyte count.  Check urinalysis, urinary drug screen.  Type and screen with sickle cell protocol initiated.  Half NS at 50 cc/h.  Monitor strict I's and O's Daily weights.  Repeat BMP in the morning.  Direct bilirubin.        #) Right pulmonary nodule: Today CTA chest was noted to demonstrate 6 mm right solid pulmonary nodule, with associated radiology recommendation for follow-up  noncontrast CT chest in 6 to 12 months.  Plan: Per radiology, recommend follow-up noncontrast CT chest in 6 to 12 months.         #) Type 2 Diabetes Mellitus: documented history of such. Home insulin regimen: None. Home oral hypoglycemic agents: Empagliflozin. presenting blood sugar: 110.   Plan: accuchecks QAC and HS with low-dose dose SSI. hold home oral hypoglycemic agent during this hospitalization.        DVT prophylaxis: SCD's   Code Status: Full code Family Communication: none Disposition Plan: Per Rounding Team Consults called: none;  Admission status: inpatient pcu  Warrants inpatient status on basis of need for further evaluation management of acute hypoxic respiratory distress in the setting of suspected severe sepsis due to community-acquired pneumonia, requiring IV antibiotics with close monitoring continuous pulse oximetry given no baseline supplemental oxygen requirements.    PLEASE NOTE THAT DRAGON DICTATION SOFTWARE WAS USED IN THE CONSTRUCTION OF THIS NOTE.   [place] DO [facility] [place]   05/26/2021, 3:33 AM

## 2021-05-27 DIAGNOSIS — J9601 Acute respiratory failure with hypoxia: Secondary | ICD-10-CM

## 2021-05-27 LAB — CBG MONITORING, ED
Glucose-Capillary: 85 mg/dL (ref 70–99)
Glucose-Capillary: 101 mg/dL — ABNORMAL HIGH (ref 70–99)

## 2021-05-27 LAB — GLUCOSE, CAPILLARY
Glucose-Capillary: 127 mg/dL — ABNORMAL HIGH (ref 70–99)
Glucose-Capillary: 110 mg/dL — ABNORMAL HIGH (ref 70–99)
Glucose-Capillary: 123 mg/dL — ABNORMAL HIGH (ref 70–99)

## 2021-05-27 LAB — MRSA NEXT GEN BY PCR, NASAL: MRSA by PCR Next Gen: NOT DETECTED

## 2021-05-27 MED ORDER — NALOXONE HCL 0.4 MG/ML IJ SOLN: 0.4000 mg | INTRAMUSCULAR | Status: DC | PRN

## 2021-05-27 MED ORDER — ORAL CARE MOUTH RINSE: 15.0000 mL | Freq: Two times a day (BID) | OROMUCOSAL | Status: DC

## 2021-05-27 MED ORDER — SODIUM CHLORIDE 0.9% FLUSH: 9.0000 mL | INTRAVENOUS | Status: DC | PRN

## 2021-05-27 MED ORDER — SODIUM CHLORIDE 0.9 % IV SOLN: INTRAVENOUS | Status: DC | PRN

## 2021-05-27 MED ORDER — CHLORHEXIDINE GLUCONATE CLOTH 2 % EX PADS
6.0000 | MEDICATED_PAD | Freq: Every day | CUTANEOUS | Status: DC
  Administered 2021-05-27 – 2021-05-28 (×2): 6 via TOPICAL

## 2021-05-27 MED ORDER — HYDROMORPHONE 1 MG/ML IV SOLN
INTRAVENOUS | Status: DC
  Administered 2021-05-27: 30 mg via INTRAVENOUS
  Administered 2021-05-27: 2.5 mg via INTRAVENOUS
  Administered 2021-05-28 (×2): 2 mg via INTRAVENOUS
  Filled 2021-05-27: qty 30

## 2021-05-27 MED ORDER — ORAL CARE MOUTH RINSE
15.0000 mL | Freq: Two times a day (BID) | OROMUCOSAL | Status: DC
  Administered 2021-05-27: 10:00:00 15 mL via OROMUCOSAL

## 2021-05-27 NOTE — TOC CM/SW Note (Signed)
°  Transition of Care [facility]) Screening Note   Patient Details  Name: ZACKARY MCKEONE Date of Birth: May 16, 1981   Transition [facility]) CM/SW Contact:    Darleene Cleaver, LCSW Phone Number: 05/27/2021, 9:45 AM    Transition of Care Department [facility]) has reviewed patient and no TOC needs have been identified at this time. We will continue to monitor patient advancement through interdisciplinary progression rounds. If new patient transition needs arise, please place a TOC consult.

## 2021-05-27 NOTE — Progress Notes (Signed)
Subjective: Michael Mullins is a 40 year old male with a medical history significant for sickle cell disease, chronic pain syndrome, opiate dependence and tolerance, history of foot ulcers, and history of type 2 diabetes mellitus was admitted for community-acquired pneumonia in the setting of sickle cell pain crisis.  Patient states that pain intensity has improved overnight.  Pain is primarily to left lower extremity.  He rates his pain as 6/10.  Patient also says that shortness of breath has resolved.  He denies any chest pain, headache, dizziness, urinary symptoms, nausea, vomiting, or diarrhea.  Objective:  Vital signs in last 24 hours:  Vitals:   05/27/21 1400 05/27/21 1500 05/27/21 1600 05/27/21 1633  BP:   (!) 113/55   Pulse: 68 74 69   Resp: 14 14 15 11   Temp:      TempSrc:      SpO2: 96% 95% 94% 95%  Weight:      Height:        Intake/Output from previous day:   Intake/Output Summary (Last 24 hours) at 05/27/2021 1709 Last data filed at 05/27/2021 1600 Gross per 24 hour  Intake 1102.84 ml  Output 1800 ml  Net -697.16 ml    Physical Exam: General: Alert, awake, oriented x3, in no acute distress.  HEENT: [place]/AT PEERL, EOMI Neck: Trachea midline,  no masses, no thyromegal,y no JVD, no carotid bruit OROPHARYNX:  Moist, No exudate/ erythema/lesions.  Heart: Regular rate and rhythm, without murmurs, rubs, gallops, PMI non-displaced, no heaves or thrills on palpation.  Lungs: Clear to auscultation, no wheezing or rhonchi noted. No increased vocal fremitus resonant to percussion  Abdomen: Soft, nontender, nondistended, positive bowel sounds, no masses no hepatosplenomegaly noted..  Neuro: No focal neurological deficits noted cranial nerves II through XII grossly intact. DTRs 2+ bilaterally upper and lower extremities. Strength 5 out of 5 in bilateral upper and lower extremities. Musculoskeletal: No warm swelling or erythema around joints, no spinal tenderness  noted. Psychiatric: Patient alert and oriented x3, good insight and cognition, good recent to remote recall. Lymph node survey: No cervical axillary or inguinal lymphadenopathy noted.  Lab Results:  Basic Metabolic Panel:    Component Value Date/Time   NA 137 05/26/2021 0350   K 4.0 05/26/2021 0350   CL 103 05/26/2021 0350   CO2 27 05/26/2021 0350   BUN 8 05/26/2021 0350   CREATININE 0.72 05/26/2021 0350   GLUCOSE 100 (H) 05/26/2021 0350   CALCIUM 8.6 (L) 05/26/2021 0350   CBC:    Component Value Date/Time   WBC 14.4 (H) 05/26/2021 0350   HGB 9.5 (L) 05/26/2021 0350   HCT 26.6 (L) 05/26/2021 0350   PLT 274 05/26/2021 0350   MCV 89.9 05/26/2021 0350   NEUTROABS 6.7 05/26/2021 0350   LYMPHSABS 4.7 (H) 05/26/2021 0350   MONOABS 2.7 (H) 05/26/2021 0350   EOSABS 0.2 05/26/2021 0350   BASOSABS 0.0 05/26/2021 0350    Recent Results (from the past 240 hour(s))  Resp Panel by RT-PCR (Flu A&B, Covid) Nasopharyngeal Swab     Status: None   Collection Time: 05/25/21  9:04 PM   Specimen: Nasopharyngeal Swab; Nasopharyngeal(NP) swabs in vial transport medium  Result Value Ref Range Status   SARS Coronavirus 2 by RT PCR NEGATIVE NEGATIVE Final    Comment: (NOTE) SARS-CoV-2 target nucleic acids are NOT DETECTED.  The SARS-CoV-2 RNA is generally detectable in upper respiratory specimens during the acute phase of infection. The lowest concentration of SARS-CoV-2 viral copies this assay can detect is  138 copies/mL. A negative result does not preclude SARS-Cov-2 infection and should not be used as the sole basis for treatment or other patient management decisions. A negative result may occur with  improper specimen collection/handling, submission of specimen other than nasopharyngeal swab, presence of viral mutation(s) within the areas targeted by this assay, and inadequate number of viral copies(<138 copies/mL). A negative result must be combined with clinical observations, patient  history, and epidemiological information. The expected result is Negative.  Fact Sheet for Patients:  BloggerCourse.com  Fact Sheet for Healthcare Providers:  SeriousBroker.it  This test is no t yet approved or cleared by the [place] FDA and  has been authorized for detection and/or diagnosis of SARS-CoV-2 by FDA under an Emergency Use Authorization (EUA). This EUA will remain  in effect (meaning this test can be used) for the duration of the COVID-19 declaration under Section 564(b)(1) of the Act, 21 U.S.C.section 360bbb-3(b)(1), unless the authorization is terminated  or revoked sooner.       Influenza A by PCR NEGATIVE NEGATIVE Final   Influenza B by PCR NEGATIVE NEGATIVE Final    Comment: (NOTE) The Xpert Xpress SARS-CoV-2/FLU/RSV plus assay is intended as an aid in the diagnosis of influenza from Nasopharyngeal swab specimens and should not be used as a sole basis for treatment. Nasal washings and aspirates are unacceptable for Xpert Xpress SARS-CoV-2/FLU/RSV testing.  Fact Sheet for Patients: BloggerCourse.com  Fact Sheet for Healthcare Providers: SeriousBroker.it  This test is not yet approved or cleared by the [place] FDA and has been authorized for detection and/or diagnosis of SARS-CoV-2 by FDA under an Emergency Use Authorization (EUA). This EUA will remain in effect (meaning this test can be used) for the duration of the COVID-19 declaration under Section 564(b)(1) of the Act, 21 U.S.C. section 360bbb-3(b)(1), unless the authorization is terminated or revoked.  Performed at [facility], [address], [place], [place] [address]   Culture, blood (Routine X 2) w Reflex to ID Panel     Status: None (Preliminary result)   Collection Time: 05/26/21  5:30 AM   Specimen: BLOOD  Result Value Ref Range Status   Specimen Description    Final    BLOOD BLOOD RIGHT HAND Performed at [facility], [address], [place], [place] [address]    Special Requests   Final    BOTTLES DRAWN AEROBIC ONLY Blood Culture adequate volume Performed at [facility], [address], [place], [place] [address]    Culture   Final    NO GROWTH 1 DAY Performed at [facility], [address], [place], [place] [address]    Report Status PENDING  Incomplete  Culture, blood (Routine X 2) w Reflex to ID Panel     Status: None (Preliminary result)   Collection Time: 05/26/21  5:30 AM   Specimen: BLOOD  Result Value Ref Range Status   Specimen Description   Final    BLOOD LEFT ANTECUBITAL Performed at [facility], [address] [place], [place] [address]    Special Requests   Final    BOTTLES DRAWN AEROBIC ONLY Blood Culture adequate volume Performed at [facility], [address], [place], [place] [address]    Culture   Final    NO GROWTH 1 DAY Performed at [facility], [address], [place], [place] [address]    Report Status PENDING  Incomplete  MRSA Next Gen by  PCR, Nasal     Status: None   Collection Time: 05/27/21  9:12 AM   Specimen: Nasal Mucosa; Nasal Swab  Result Value Ref Range Status   MRSA by PCR Next Gen NOT DETECTED NOT DETECTED Final    Comment: (NOTE) The GeneXpert MRSA Assay (FDA approved for NASAL specimens only), is one component of a comprehensive MRSA colonization surveillance program. It is not intended to diagnose MRSA infection nor to guide or monitor treatment for MRSA infections. Test performance is not FDA approved in patients less than 23 years old. Performed at [facility], [address] [place], [place] [address]     Studies/Results: DG Ankle Complete Left  Result Date: 05/25/2021 CLINICAL DATA:  Lateral pain and swelling EXAM: LEFT ANKLE COMPLETE - 3+ VIEW COMPARISON:  None.  FINDINGS: There is no evidence of fracture, dislocation, or joint effusion. There is no evidence of arthropathy or other focal bone abnormality. Soft tissues are unremarkable. IMPRESSION: Negative. Electronically Signed   By: Jasmine Pang M.D.   On: 05/25/2021 21:10   CT Angio Chest PE W/Cm &/Or Wo Cm  Result Date: 05/26/2021 CLINICAL DATA:  Pulmonary embolism (PE) suspected, positive D-dimer. Sickle cell disease. History of pulmonary embolism. EXAM: CT ANGIOGRAPHY CHEST WITH CONTRAST TECHNIQUE: Multidetector CT imaging of the chest was performed using the standard protocol during bolus administration of intravenous contrast. Multiplanar CT image reconstructions and MIPs were obtained to evaluate the vascular anatomy. CONTRAST:  45mL OMNIPAQUE IOHEXOL 350 MG/ML SOLN COMPARISON:  11/06/2006 FINDINGS: Cardiovascular: Adequate opacification of the pulmonary arterial tree. No intraluminal filling defect identified to suggest acute pulmonary embolism. Central pulmonary arteries are of normal caliber. No significant coronary artery calcification. Cardiac size is mildly enlarged, stable since prior examination. No pericardial effusion. No significant atherosclerotic calcification within the thoracic aorta. No aortic aneurysm. Mediastinum/Nodes: No enlarged mediastinal, hilar, or axillary lymph nodes. Thyroid gland, trachea, and esophagus demonstrate no significant findings. Lungs/Pleura: Linear atelectasis noted at the lung bases bilaterally. Superimposed ground-glass opacity at the lung bases may represent atelectasis or mild infiltrate. 6 mm noncalcified pulmonary nodule noted within the subpleural right lower lobe, axial image # 46/6. No pneumothorax or pleural effusion. 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  No acute abnormality. Musculoskeletal: Avascular necrosis of the humeral heads bilaterally without associated subarticular collapse. Osseous structures are diffusely sclerotic in keeping with chronic  changes of sickle cell disease. No acute bone abnormality. Review of the MIP images confirms the above findings. IMPRESSION: No pulmonary embolism. Stable cardiomegaly. Bibasilar ground-glass pulmonary opacity may represent subtle infiltrate in the setting of acute chest syndrome. 6 mm right solid pulmonary nodule. Recommend a non-contrast Chest CT at 6-12 months. If patient is high risk for malignancy, recommend an additional non-contrast Chest CT at 18-24 months; if patient is low risk for malignancy a non-contrast Chest CT at 18-24 months is optional. These guidelines do not apply to immunocompromised patients and patients with cancer. Follow up in patients with significant comorbidities as clinically warranted. For lung cancer screening, adhere to Lung-RADS guidelines. Reference: Radiology. 2017; 284(1):228-43. 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 Electronically Signed   By: Helyn Numbers M.D.   On: 05/26/2021 02:00   DG Chest Port 1 View  Result Date: 05/25/2021 CLINICAL DATA:  Hypoxia.  Sickle cell crisis. EXAM: PORTABLE CHEST 1 VIEW COMPARISON:  January 04, 2020 FINDINGS: The heart size and mediastinal contours are within normal limits. Both lungs are clear.  The visualized skeletal structures are unremarkable. IMPRESSION: No active disease. Electronically Signed   By: Gerome Sam III M.D.   On: 05/25/2021 19:55    Medications: Scheduled Meds:  Chlorhexidine Gluconate Cloth  6 each Topical Daily   HYDROmorphone   Intravenous Q4H   insulin aspart  0-9 Units Subcutaneous TID WC   mouth rinse  15 mL Mouth Rinse BID   Continuous Infusions:  sodium chloride     azithromycin Stopped (05/27/21 0446)   cefTRIAXone (ROCEPHIN)  IV Stopped (05/27/21 0253)   PRN Meds:.sodium chloride, acetaminophen **OR** acetaminophen, diphenhydrAMINE, ketorolac, naLOXone (NARCAN)  injection, naloxone **AND** sodium chloride flush, ondansetron (ZOFRAN) IV,  oxyCODONE  Consultants: None  Procedures: None  Antibiotics: IV Azithromycin IV Ceftriaxone  Assessment/Plan: Principal Problem:   Acute respiratory failure with hypoxia (HCC) Active Problems:   SOB (shortness of breath)   CAP (community acquired pneumonia)   Severe sepsis (HCC)   Left ankle pain   Pulmonary nodule   Diabetes mellitus without complication (HCC)  Community-acquired pneumonia: CT of chest showed bibasilar groundglass pulmonary opacity that may represent subtle infiltrate in the setting of acute chest syndrome.  Acute chest syndrome less likely.  Sepsis ruled out, blood cultures negative, lactic acid negative.  Urinalysis unremarkable.  IV antibiotics initiated.  Patient remains afebrile.  Tylenol 650 mg every 4 hours as needed for fever.  Supplemental oxygen as needed.  Titrate to room air. Continue to monitor closely.  Sickle cell disease with pain crisis: Reduce IV fluids to KVO Continue IV Dilaudid PCA without changes in settings today Oxycodone 15 mg every 4 hours as needed for severe breakthrough pain Toradol 15 mg IV every 6 hours Monitor vital signs closely, reevaluate pain scale regularly, and supplemental oxygen as needed  Anemia of chronic disease: Patient's hemoglobin is stable and consistent with his baseline.  There is no clinical indication for blood transfusion at this time.  Chronic pain syndrome: Continue home medications  Type 2 diabetes mellitus: Stable.  Hold home medications.  Continue SSI.  Code Status: Full Code Family Communication: N/A Disposition Plan: Not yet ready for discharge.  Transfer to telemetry   Nolon Nations  APRN, MSN, FNP-C Patient [facility] [address] [place], [place] [address] [phone]  If 7PM-7AM, please contact night-coverage.  05/27/2021, 5:09 PM  LOS: 1 day

## 2021-05-28 ENCOUNTER — Other Ambulatory Visit: Payer: Self-pay

## 2021-05-28 DIAGNOSIS — R6520 Severe sepsis without septic shock: Secondary | ICD-10-CM

## 2021-05-28 DIAGNOSIS — A419 Sepsis, unspecified organism: Principal | ICD-10-CM

## 2021-05-28 DIAGNOSIS — R911 Solitary pulmonary nodule: Secondary | ICD-10-CM

## 2021-05-28 DIAGNOSIS — R0602 Shortness of breath: Secondary | ICD-10-CM

## 2021-05-28 DIAGNOSIS — E119 Type 2 diabetes mellitus without complications: Secondary | ICD-10-CM

## 2021-05-28 DIAGNOSIS — M25572 Pain in left ankle and joints of left foot: Secondary | ICD-10-CM

## 2021-05-28 DIAGNOSIS — D5700 Hb-SS disease with crisis, unspecified: Secondary | ICD-10-CM

## 2021-05-28 DIAGNOSIS — J189 Pneumonia, unspecified organism: Secondary | ICD-10-CM

## 2021-05-28 LAB — CBC WITH DIFFERENTIAL/PLATELET
Abs Immature Granulocytes: 0.06 10*3/uL (ref 0.00–0.07)
Basophils Absolute: 0 10*3/uL (ref 0.0–0.1)
Basophils Relative: 0 %
Eosinophils Absolute: 0.1 10*3/uL (ref 0.0–0.5)
Eosinophils Relative: 1 %
HCT: 26.8 % — ABNORMAL LOW (ref 39.0–52.0)
Hemoglobin: 9.6 g/dL — ABNORMAL LOW (ref 13.0–17.0)
Immature Granulocytes: 1 %
Lymphocytes Relative: 22 %
Lymphs Abs: 2.8 10*3/uL (ref 0.7–4.0)
MCH: 31.4 pg (ref 26.0–34.0)
MCHC: 35.8 g/dL (ref 30.0–36.0)
MCV: 87.6 fL (ref 80.0–100.0)
Monocytes Absolute: 1.7 10*3/uL — ABNORMAL HIGH (ref 0.1–1.0)
Monocytes Relative: 13 %
Neutro Abs: 7.9 10*3/uL — ABNORMAL HIGH (ref 1.7–7.7)
Neutrophils Relative %: 63 %
Platelets: 220 10*3/uL (ref 150–400)
RBC: 3.06 MIL/uL — ABNORMAL LOW (ref 4.22–5.81)
RDW: 26.3 % — ABNORMAL HIGH (ref 11.5–15.5)
WBC: 12.7 10*3/uL — ABNORMAL HIGH (ref 4.0–10.5)
nRBC: 3.2 % — ABNORMAL HIGH (ref 0.0–0.2)

## 2021-05-28 LAB — GLUCOSE, CAPILLARY
Glucose-Capillary: 151 mg/dL — ABNORMAL HIGH (ref 70–99)
Glucose-Capillary: 56 mg/dL — ABNORMAL LOW (ref 70–99)
Glucose-Capillary: 72 mg/dL (ref 70–99)
Glucose-Capillary: 127 mg/dL — ABNORMAL HIGH (ref 70–99)

## 2021-05-28 LAB — BASIC METABOLIC PANEL
Anion gap: 6 (ref 5–15)
BUN: 9 mg/dL (ref 6–20)
CO2: 27 mmol/L (ref 22–32)
Calcium: 8.8 mg/dL — ABNORMAL LOW (ref 8.9–10.3)
Chloride: 107 mmol/L (ref 98–111)
Creatinine, Ser: 0.49 mg/dL — ABNORMAL LOW (ref 0.61–1.24)
GFR, Estimated: >60 mL/min (ref >60)
Glucose, Bld: 105 mg/dL — ABNORMAL HIGH (ref 70–99)
Potassium: 4.3 mmol/L (ref 3.5–5.1)
Sodium: 140 mmol/L (ref 135–145)

## 2021-05-28 MED ORDER — GUAIFENESIN-DM 100-10 MG/5ML PO SYRP
10.0000 mL | ORAL_SOLUTION | ORAL | 0 refills | Status: DC | PRN
Start: 2021-05-28 — End: 2022-08-27
  Filled 2021-05-28: qty 118, 2d supply, fill #0

## 2021-05-28 MED ORDER — AMOXICILLIN-POT CLAVULANATE 875-125 MG PO TABS
1.0000 | ORAL_TABLET | Freq: Two times a day (BID) | ORAL | 0 refills | Status: AC
  Filled 2021-05-28 – 2021-05-29 (×2): qty 14, 7d supply, fill #0

## 2021-05-28 MED ORDER — WITCH HAZEL-GLYCERIN EX PADS: MEDICATED_PAD | CUTANEOUS | Status: DC | PRN

## 2021-05-28 MED ORDER — GUAIFENESIN-DM 100-10 MG/5ML PO SYRP: 10.0000 mL | ORAL_SOLUTION | ORAL | Status: DC | PRN

## 2021-05-28 NOTE — Progress Notes (Signed)
-  Ambulated patient in hallways after weaning patient to room at 1400.  -Patient SaO2 on room air at rest = 98% -Patient SaO2 on room air while ambulating = 95% -No oxygen required to maintain oxygen above 88%. No distress noted with ambulation.  -Dr. Hyman Hopes made aware of patient ambulation and tolerance while on room air.

## 2021-05-28 NOTE — Discharge Summary (Signed)
Physician Discharge Summary  Michael Mullins [id number] DOB: 1980/07/28 DOA: 05/25/2021  PCP: Tawni Pummel, MD  Admit date: 05/25/2021  Discharge date: 05/28/2021  Discharge Diagnoses:  Principal Problem:   Acute respiratory failure with hypoxia ([place]) Active Problems:   SOB (shortness of breath)   CAP (community acquired pneumonia)   Severe sepsis ([place])   Left ankle pain   Pulmonary nodule   Diabetes mellitus without complication [facility])  Discharge Condition: Stable  Disposition:   Follow-up Information     Tawni Pummel, MD. Schedule an appointment as soon as possible for a visit.   Specialty: Internal Medicine Contact information: [address] [place] [address] [phone]         Tresa Garter, MD. Call in 1 week(s).   Specialty: Internal Medicine Contact information: [place] [address] [phone]                Pt is discharged home in good condition and is to follow up with Coy Saunas, Myrna Blazer, MD this week to have labs evaluated. KYREEM LAWVER is instructed to increase activity slowly and balance with rest for the next few days, and use prescribed medication to complete treatment of pain  Diet: Regular Wt Readings from Last 3 Encounters:  05/28/21 99.2 kg  05/23/21 99.8 kg  01/23/21 99.3 kg   History of present illness:  Michael Mullins is a 40 y.o. male with medical history significant for sickle cell anemia with baseline hemoglobin range 10-11 prior sickle cell pain crises, type 2 diabetes mellitus, who is admitted to [facility] on 05/25/2021 with acute hypoxic respiratory distress in the setting of suspected community-acquired pneumonia after presenting from home to [facility] ED complaining of left ankle pain.    The patient acknowledges a history of sickle cell anemia, complicated by prior sickle cell pain crises, resulting in multiple previous hospitalizations thereof.  He  reports that his typical distribution of pain experienced at times of previous sickle cell pain crises is left ankle pain, with which he presents to [facility] emergency department today, noting 2-day duration of circumferential left ankle pain.  Describes the pain is sharp, nonradiating, and constant in nature, worsening with movement, including dorsiflexion/plantarflexion of the left foot.  Denies any preceding trauma.  Not associate with any new lower extremity erythema, edema, or calf tenderness.  Denies any additional arthralgias or myalgias.  Per chart review, his baseline hemoglobin ranges 10-11.    He notes 2 days of associated shortness of breath, associated with new onset nonproductive cough.  Shortness of breath has not been associate with any orthopnea, PND, hemoptysis, or wheezing.  Denies any associated subjective fever, chills, rigors, or generalized myalgias.  He also denies any recent chest pain, palpitations, diaphoresis, dizziness, presyncope, or syncope.  Denies any associated nausea, vomiting, diarrhea, rash, abdominal pain headache, neck stiffness.  He also denies any recent dysuria or gross hematuria.  Denies any recent or recurrent alcohol consumption, no history of recreational drug use, and conveys that he is a lifelong non-smoker.  Denies a chronic known underlying pulmonary pathology, and denies any known baseline supplemental oxygen requirement.   Sliding persistent left ankle pain in spite of outpatient analgesic intervention, including home methadone and prn oxycodone IR, patient presented to [facility] emergency department today for further evaluation management of left ankle discomfort.   ED Course:  Vital signs in the ED were notable for the following: Temperature max 97.9; heart  rate 68-90; blood pressure 101/88 -128/67; respiratory rate 15-22, oxygen saturation initially noted to be 83 to 86% on room air, with ensuing improvement to 90 to 94% on 2 L nasal cannula   Labs  were notable for the following: CMP notable for the following: Creatinine 0.68, glucose 110, albumin 4.1, alkaline phosphatase 86, AST 68, ALT 83, total bilirubin 4.0.  CBC notable for white cell count 14,400, hemoglobin 10.2 compared to most recent prior hemoglobin data point of 10.3 on 05/23/2021, platelet count 317, reticulocyte count percentage 22.3.  D-dimer 2.96.  COVID-19/influenza PCR were checked in the ED today and found to be negative.   Imaging and additional notable ED work-up: EKG shows sinus rhythm with heart rate 73, normal intervals, and no evidence of T wave or ST changes, including no evidence of ST elevation.  Chest x-ray showed no evidence of acute cardiopulmonary process.  Plain films of the left ankle showed no evidence of acute fracture, dislocation, or joint effusion, and also showed no evidence of soft tissue abnormality.  CTA chest showed no evidence of acute pulmonary embolism, will demonstrating bibasilar airspace opacities suggestive of infiltrate, in the absence of evidence of edema, effusion, or pneumothorax.  CTA chest also showed evidence of 6 mm right solid pulmonary nodule, with associated radiology recommendation for follow-up noncontrast CT chest in 6 to 12 months.   While in the ED, the following were administered: Following Dilaudid 2 mg IV x2, Toradol 15 mg IV x1 and oxycodone IR 30 mg p.o. x1, patient reported significant provement in his presenting left ankle discomfort; he also received the following: Azithromycin, Rocephin. The patient denies any pain pain control as relates to his left ankle discomfort, he is being admitted for further evaluation and management of acute hypoxic respiratory distress in the setting of suspected community-acquired pneumonia.   Hospital Course:  Patient was admitted for acute hypoxic respiratory distress secondary to community-acquired pneumonia, severe sepsis due to pneumonia and sickle cell pain crisis and managed appropriately with  IVF, IV antibiotics, IV Dilaudid via PCA and IV Toradol, as well as other adjunct therapies per sickle cell pain management protocols. Patient also has history of diabetes mellitus was continued on his home medication and insulin sliding scale during this admission. Patient responded to IV antibiotics, oxygenation improved even on room air, cough subsided as well as subjective fever.  As of today, patient ambulating well with no restriction and oxygen saturations on room air at rest was 98% and 95% on ambulation.  Patient did not require any additional oxygen even after ambulation.  Hemoglobin remained stable at baseline throughout this admission, patient did not require any blood transfusion.  Today patient requested to be discharged home because he feels at baseline and his pain is at 3/10 which he claims he can manage at home.  He will be discharged home on Augmentin for another 7 days to complete treatment for community-acquired pneumonia.  After ambulation, patient remained hemodynamically stable for discharged to home.  Patient was therefore discharged home today in a hemodynamically stable condition.   Ericberto will follow-up with PCP within 1 week of this discharge. Aniketh was counseled extensively about nonpharmacologic means of pain management, patient verbalized understanding and was appreciative of  the care received during this admission.   We discussed the need for good hydration, monitoring of hydration status, avoidance of heat, cold, stress, and infection triggers. We discussed the need to be adherent with taking Hydrea and other home medications. Patient was reminded of  the need to seek medical attention immediately if any symptom of bleeding, anemia, or infection occurs.  Discharge Exam: Vitals:   05/28/21 1233 05/28/21 1400  BP:    Pulse:    Resp: 17 16  Temp:    SpO2: 100% 100%   Vitals:   05/28/21 0845 05/28/21 1014 05/28/21 1233 05/28/21 1400  BP:  131/75    Pulse:  64     Resp: 10 15 17 16   Temp:  97.6 F (36.4 C)    TempSrc:  Oral    SpO2: 98% 98% 100% 100%  Weight:      Height:       General appearance : Awake, alert, not in any distress. Speech Clear. Not toxic looking HEENT: Atraumatic and Normocephalic, pupils equally reactive to light and accomodation Neck: Supple, no JVD. No cervical lymphadenopathy.  Chest: Good air entry bilaterally, no added sounds  CVS: S1 S2 regular, no murmurs.  Abdomen: Bowel sounds present, Non tender and not distended with no gaurding, rigidity or rebound. Extremities: B/L Lower Ext shows no edema, both legs are warm to touch Neurology: Awake alert, and oriented X 3, CN II-XII intact, Non focal Skin: No Rash  Discharge Instructions  Discharge Instructions     Diet - low sodium heart healthy   Complete by: As directed    Increase activity slowly   Complete by: As directed         Significant Diagnostic Studies: DG Ankle Complete Left  Result Date: 05/25/2021 CLINICAL DATA:  Lateral pain and swelling EXAM: LEFT ANKLE COMPLETE - 3+ VIEW COMPARISON:  None. FINDINGS: There is no evidence of fracture, dislocation, or joint effusion. There is no evidence of arthropathy or other focal bone abnormality. Soft tissues are unremarkable. IMPRESSION: Negative. Electronically Signed   By: Donavan Foil M.D.   On: 05/25/2021 21:10   CT Angio Chest PE W/Cm &/Or Wo Cm  Result Date: 05/26/2021 CLINICAL DATA:  Pulmonary embolism (PE) suspected, positive D-dimer. Sickle cell disease. History of pulmonary embolism. EXAM: CT ANGIOGRAPHY CHEST WITH CONTRAST TECHNIQUE: Multidetector CT imaging of the chest was performed using the standard protocol during bolus administration of intravenous contrast. Multiplanar CT image reconstructions and MIPs were obtained to evaluate the vascular anatomy. CONTRAST:  6mL OMNIPAQUE IOHEXOL 350 MG/ML SOLN COMPARISON:  11/06/2006 FINDINGS: Cardiovascular: Adequate opacification of the pulmonary  arterial tree. No intraluminal filling defect identified to suggest acute pulmonary embolism. Central pulmonary arteries are of normal caliber. No significant coronary artery calcification. Cardiac size is mildly enlarged, stable since prior examination. No pericardial effusion. No significant atherosclerotic calcification within the thoracic aorta. No aortic aneurysm. Mediastinum/Nodes: No enlarged mediastinal, hilar, or axillary lymph nodes. Thyroid gland, trachea, and esophagus demonstrate no significant findings. Lungs/Pleura: Linear atelectasis noted at the lung bases bilaterally. Superimposed ground-glass opacity at the lung bases may represent atelectasis or mild infiltrate. 6 mm noncalcified pulmonary nodule noted within the subpleural right lower lobe, axial image # 46/6. No pneumothorax or pleural effusion. 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  No acute abnormality. Musculoskeletal: Avascular necrosis of the humeral heads bilaterally without associated subarticular collapse. Osseous structures are diffusely sclerotic in keeping with chronic changes of sickle cell disease. No acute bone abnormality. Review of the MIP images confirms the above findings. IMPRESSION: No pulmonary embolism. Stable cardiomegaly. Bibasilar ground-glass pulmonary opacity may represent subtle infiltrate in the setting of acute chest syndrome. 6 mm right solid pulmonary nodule. Recommend a non-contrast Chest CT at 6-12 months. If patient is  high risk for malignancy, recommend an additional non-contrast Chest CT at 18-24 months; if patient is low risk for malignancy a non-contrast Chest CT at 18-24 months is optional. These guidelines do not apply to immunocompromised patients and patients with cancer. Follow up in patients with significant comorbidities as clinically warranted. For lung cancer screening, adhere to Lung-RADS guidelines. Reference: Radiology. 2017; 284(1):228-43. Chronic infarction of the spleen and osseous  changes in keeping with sickle cell disease. Electronically Signed   By: Fidela Salisbury M.D.   On: 05/26/2021 02:00   DG Chest Port 1 View  Result Date: 05/25/2021 CLINICAL DATA:  Hypoxia.  Sickle cell crisis. EXAM: PORTABLE CHEST 1 VIEW COMPARISON:  January 04, 2020 FINDINGS: The heart size and mediastinal contours are within normal limits. Both lungs are clear. The visualized skeletal structures are unremarkable. IMPRESSION: No active disease. Electronically Signed   By: Dorise Bullion III M.D.   On: 05/25/2021 19:55    Microbiology: Recent Results (from the past 240 hour(s))  Resp Panel by RT-PCR (Flu A&B, Covid) Nasopharyngeal Swab     Status: None   Collection Time: 05/25/21  9:04 PM   Specimen: Nasopharyngeal Swab; Nasopharyngeal(NP) swabs in vial transport medium  Result Value Ref Range Status   SARS Coronavirus 2 by RT PCR NEGATIVE NEGATIVE Final    Comment: (NOTE) SARS-CoV-2 target nucleic acids are NOT DETECTED.  The SARS-CoV-2 RNA is generally detectable in upper respiratory specimens during the acute phase of infection. The lowest concentration of SARS-CoV-2 viral copies this assay can detect is 138 copies/mL. A negative result does not preclude SARS-Cov-2 infection and should not be used as the sole basis for treatment or other patient management decisions. A negative result may occur with  improper specimen collection/handling, submission of specimen other than nasopharyngeal swab, presence of viral mutation(s) within the areas targeted by this assay, and inadequate number of viral copies(<138 copies/mL). A negative result must be combined with clinical observations, patient history, and epidemiological information. The expected result is Negative.  Fact Sheet for Patients:  EntrepreneurPulse.com.au  Fact Sheet for Healthcare Providers:  IncredibleEmployment.be  This test is no t yet approved or cleared by the [place] FDA and   has been authorized for detection and/or diagnosis of SARS-CoV-2 by FDA under an Emergency Use Authorization (EUA). This EUA will remain  in effect (meaning this test can be used) for the duration of the COVID-19 declaration under Section 564(b)(1) of the Act, 21 U.S.C.section 360bbb-3(b)(1), unless the authorization is terminated  or revoked sooner.       Influenza A by PCR NEGATIVE NEGATIVE Final   Influenza B by PCR NEGATIVE NEGATIVE Final    Comment: (NOTE) The Xpert Xpress SARS-CoV-2/FLU/RSV plus assay is intended as an aid in the diagnosis of influenza from Nasopharyngeal swab specimens and should not be used as a sole basis for treatment. Nasal washings and aspirates are unacceptable for Xpert Xpress SARS-CoV-2/FLU/RSV testing.  Fact Sheet for Patients: EntrepreneurPulse.com.au  Fact Sheet for Healthcare Providers: IncredibleEmployment.be  This test is not yet approved or cleared by the [place] FDA and has been authorized for detection and/or diagnosis of SARS-CoV-2 by FDA under an Emergency Use Authorization (EUA). This EUA will remain in effect (meaning this test can be used) for the duration of the COVID-19 declaration under Section 564(b)(1) of the Act, 21 U.S.C. section 360bbb-3(b)(1), unless the authorization is terminated or revoked.  Performed at [facility], [place] [address], [place], [place] [address]   Culture,  blood (Routine X 2) w Reflex to ID Panel     Status: None (Preliminary result)   Collection Time: 05/26/21  5:30 AM   Specimen: BLOOD  Result Value Ref Range Status   Specimen Description   Final    BLOOD BLOOD RIGHT HAND Performed at [facility] [address], [place], [place] [address]    Special Requests   Final    BOTTLES DRAWN AEROBIC ONLY Blood Culture adequate volume Performed at [facility] [address], [place], [place] [address]     Culture   Final    NO GROWTH 2 DAYS Performed at [facility] [address] [place], [place] [address]    Report Status PENDING  Incomplete  Culture, blood (Routine X 2) w Reflex to ID Panel     Status: None (Preliminary result)   Collection Time: 05/26/21  5:30 AM   Specimen: BLOOD  Result Value Ref Range Status   Specimen Description   Final    BLOOD LEFT ANTECUBITAL Performed at [facility] [address], [place], [place] [address]    Special Requests   Final    BOTTLES DRAWN AEROBIC ONLY Blood Culture adequate volume Performed at [facility] [address] [place], [place] [address]    Culture   Final    NO GROWTH 2 DAYS Performed at [facility] [address], [place], [place] [address]    Report Status PENDING  Incomplete  MRSA Next Gen by PCR, Nasal     Status: None   Collection Time: 05/27/21  9:12 AM   Specimen: Nasal Mucosa; Nasal Swab  Result Value Ref Range Status   MRSA by PCR Next Gen NOT DETECTED NOT DETECTED Final    Comment: (NOTE) The GeneXpert MRSA Assay (FDA approved for NASAL specimens only), is one component of a comprehensive MRSA colonization surveillance program. It is not intended to diagnose MRSA infection nor to guide or monitor treatment for MRSA infections. Test performance is not FDA approved in patients less than 18 years old. Performed at [facility], [place] [address], [place], [place] [address]      Labs: Basic Metabolic Panel: Recent Labs  Lab 05/23/21 0748 05/25/21 2104 05/26/21 0350 05/28/21 1354  NA 136 137 137 140  K 4.2 4.4 4.0 4.3  CL 102 104 103 107  CO2 25 26 27 27   GLUCOSE 121* 110* 100* 105*  BUN 14 8 8 9   CREATININE 0.60* 0.68 0.72 0.49*  CALCIUM 9.1 8.8* 8.6* 8.8*  MG  --   --  2.2  --   PHOS  --   --  4.1  --    Liver Function Tests: Recent Labs  Lab 05/25/21 2104 05/26/21 0350  AST 68* 48*  ALT 43 37  ALKPHOS 86 76  BILITOT  4.0* 3.6*  PROT 8.3* 7.7  ALBUMIN 4.1 3.9   No results for input(s): LIPASE, AMYLASE in the last 168 hours. No results for input(s): AMMONIA in the last 168 hours. CBC: Recent Labs  Lab 05/23/21 0748 05/25/21 2104 05/26/21 0350 05/28/21 1354  WBC 17.6* 14.4* 14.4* PENDING  NEUTROABS 10.4* 7.6 6.7 PENDING  HGB 10.3* 10.2* 9.5* 9.6*  HCT 28.6* 28.8* 26.6* 26.8*  MCV 86.7 90.6 89.9 87.6  PLT 328 317 274 220   Cardiac Enzymes: No results for input(s): CKTOTAL, CKMB, CKMBINDEX, TROPONINI in the last 168 hours. BNP: Invalid input(s): POCBNP CBG: Recent  Labs  Lab 05/27/21 2222 05/28/21 0908 05/28/21 1224 05/28/21 1254 05/28/21 1511  GLUCAP 123* 151* 56* 72 127*    Time coordinating discharge: 50 minutes  Signed:  Brylyn Novakovich  [facility] 05/28/2021, 4:08 PM

## 2021-05-28 NOTE — Progress Notes (Addendum)
Hypoglycemic Event  CBG: 56 at 1224  Treatment: 8 oz juice/soda  Symptoms: None  Follow-up CBG: Time: 1254 CBG Result:72   Possible Reasons for Event: Inadequate meal intake  Comments/MD notified: Dr. Hyman Hopes at bedside and notified of hypoglycemic event.   Kallie Locks   NT [place] notified this RN that patient CBG 56, standing orders for hypoglycemia initiated. 120 mLs of orange juice provided to patient.  Patient stated that he did not eat breakfast this morning, though this RN instructed him to do so due to insulin coverage.

## 2021-05-28 NOTE — Progress Notes (Signed)
Nursing Discharge Note   Admit Date: 05/25/2021  Discharge date: 05/28/2021   Michael Mullins is to be discharged home per MD order.  AVS completed. Reviewed with patient at bedside. Highlighted copy provided for patient to take home.  Patient able to verbalize understanding of discharge instructions. PIV removed. Patient stable upon discharge.   Discharge Instructions   None     Allergies as of 05/28/2021       Reactions   Morphine Hives   Tolerates hydromorphone per MAR   Vancomycin Itching   Tolerates with longer infusion times and premedcation with IV bendryl        Medication List     TAKE these medications    Accutane 40 MG capsule Generic drug: ISOtretinoin Take 40 mg by mouth daily.   amoxicillin-clavulanate 875-125 MG tablet Commonly known as: Augmentin Take 1 tablet by mouth 2 (two) times daily for 7 days.   collagenase ointment Commonly known as: SANTYL Apply to the affected area topically daily.   ferrous sulfate 325 (65 FE) MG tablet Commonly known as: FeroSul Take by mouth.   folic acid 1 MG tablet Commonly known as: FOLVITE Take 1 tablet by mouth daily What changed:  how much to take how to take this when to take this   guaiFENesin-dextromethorphan 100-10 MG/5ML syrup Commonly known as: ROBITUSSIN DM Take 10 mLs by mouth every 4 (four) hours as needed for cough.   Jardiance 10 MG Tabs tablet Generic drug: empagliflozin Take 1 tablet (10 mg total) by mouth daily.   lisinopril 2.5 MG tablet Commonly known as: ZESTRIL Take 1 tablet (2.5 mg total) by mouth daily.   methadone 10 MG tablet Commonly known as: DOLOPHINE Take 3 tablets by mouth 3 times daily. What changed:  how much to take how to take this when to take this   naloxone 4 MG/0.1ML Liqd nasal spray kit Commonly known as: NARCAN Use as directed every 2-3 minutes until emergency arrives, call 911   ondansetron 4 MG tablet Commonly known as: ZOFRAN Take 1 tablet (4 mg  total) by mouth every 8 (eight) hours as needed for Nausea / Vomiting.   Oxbryta 500 MG Tabs tablet Generic drug: voxelotor Take 1,500 mg by mouth daily.   oxycodone 30 MG immediate release tablet Commonly known as: ROXICODONE Take 1 tablet by mouth every 6 hours as needed for Pain. What changed: reasons to take this   triamcinolone cream 0.1 % Commonly known as: KENALOG Apply to affected skin of arms twice daily as needed. NEVER to face/neck/groin.   Vitamin D (Ergocalciferol) 1.25 MG (50000 UNIT) Caps capsule Commonly known as: DRISDOL Take 1 capsule by mouth every 30 days What changed: Another medication with the same name was changed. Make sure you understand how and when to take each.   Vitamin D (Ergocalciferol) 1.25 MG (50000 UNIT) Caps capsule Commonly known as: DRISDOL Take 1 capsule by mouth every 30 days What changed:  how much to take how to take this when to take this         Discharge Instructions/ Education: Discharge instructions given to patient with verbalized understanding.  Discharge education completed with patient including: follow up instructions, medication list, discharge activities, and limitations if indicated.   Patient escorted via wheelchair to lobby and discharged home via private automobile.

## 2021-05-29 ENCOUNTER — Other Ambulatory Visit: Payer: Self-pay | Admitting: *Deleted

## 2021-05-29 ENCOUNTER — Other Ambulatory Visit (HOSPITAL_BASED_OUTPATIENT_CLINIC_OR_DEPARTMENT_OTHER): Payer: Self-pay

## 2021-05-29 ENCOUNTER — Other Ambulatory Visit: Payer: Self-pay

## 2021-05-29 LAB — LEGIONELLA PNEUMOPHILA SEROGP 1 UR AG: L. pneumophila Serogp 1 Ur Ag: NEGATIVE

## 2021-05-29 NOTE — Patient Outreach (Signed)
Michael Mullins discharged from [facility]. I have assigned Michael Cousin, RN to outreach and determine if there are any needs for transition.

## 2021-05-29 NOTE — Patient Outreach (Signed)
[facility]) Care Management  05/29/2021  Michael Mullins [birth year] [id number]   Transition of care call  Referral received:12/20 Initial outreach:12/20 Insurance: [employer]/ [facility]   Initial telephone call to patient, 2 HIPAA identifiers verified.   No ongoing care management needs identified;  Patient has a new diagnosis of Sick Cell Crises. Pt has supportive help in the home and states he will pick up his medications this evening. Encouraged pt to call the pharmacy concerning late evening pick up of medications to ensure his medications will be available. Verified no issues with GI/GU and pt is no longer experiencing any crises symptoms.   Pt states he will call his provider to make a post-op  hospital follow up and verified he will have sufficient transportation to get to his appointment. Transition of care completed with no further needs. Case will be closed.   Elliot Cousin, RN Care Management Coordinator [facility] Main Office [phone]

## 2021-05-30 ENCOUNTER — Encounter (HOSPITAL_COMMUNITY): Payer: Self-pay

## 2021-05-30 ENCOUNTER — Other Ambulatory Visit: Payer: Self-pay

## 2021-05-30 ENCOUNTER — Emergency Department (HOSPITAL_COMMUNITY): Payer: 59

## 2021-05-30 ENCOUNTER — Other Ambulatory Visit (HOSPITAL_BASED_OUTPATIENT_CLINIC_OR_DEPARTMENT_OTHER): Payer: Self-pay

## 2021-05-30 ENCOUNTER — Inpatient Hospital Stay (HOSPITAL_COMMUNITY)
Admission: EM | Admit: 2021-05-30 | Discharge: 2021-06-03 | DRG: 811 | Disposition: A | Discharge status: Home / Self Care | Payer: 59 | Attending: Internal Medicine | Admitting: Internal Medicine

## 2021-05-30 DIAGNOSIS — Y95 Nosocomial condition: Secondary | ICD-10-CM | POA: Diagnosis present

## 2021-05-30 DIAGNOSIS — D638 Anemia in other chronic diseases classified elsewhere: Secondary | ICD-10-CM | POA: Diagnosis present

## 2021-05-30 DIAGNOSIS — J9601 Acute respiratory failure with hypoxia: Secondary | ICD-10-CM | POA: Diagnosis not present

## 2021-05-30 DIAGNOSIS — R0902 Hypoxemia: Secondary | ICD-10-CM

## 2021-05-30 DIAGNOSIS — G894 Chronic pain syndrome: Secondary | ICD-10-CM | POA: Diagnosis present

## 2021-05-30 DIAGNOSIS — Z7984 Long term (current) use of oral hypoglycemic drugs: Secondary | ICD-10-CM | POA: Diagnosis not present

## 2021-05-30 DIAGNOSIS — Z20822 Contact with and (suspected) exposure to covid-19: Secondary | ICD-10-CM | POA: Diagnosis not present

## 2021-05-30 DIAGNOSIS — E1165 Type 2 diabetes mellitus with hyperglycemia: Secondary | ICD-10-CM | POA: Diagnosis not present

## 2021-05-30 DIAGNOSIS — D72828 Other elevated white blood cell count: Secondary | ICD-10-CM | POA: Diagnosis not present

## 2021-05-30 DIAGNOSIS — D5700 Hb-SS disease with crisis, unspecified: Principal | ICD-10-CM

## 2021-05-30 DIAGNOSIS — Z79899 Other long term (current) drug therapy: Secondary | ICD-10-CM | POA: Diagnosis not present

## 2021-05-30 DIAGNOSIS — R079 Chest pain, unspecified: Secondary | ICD-10-CM | POA: Diagnosis not present

## 2021-05-30 DIAGNOSIS — J189 Pneumonia, unspecified organism: Secondary | ICD-10-CM

## 2021-05-30 DIAGNOSIS — I517 Cardiomegaly: Secondary | ICD-10-CM | POA: Diagnosis not present

## 2021-05-30 DIAGNOSIS — J9811 Atelectasis: Secondary | ICD-10-CM | POA: Diagnosis not present

## 2021-05-30 LAB — LACTATE DEHYDROGENASE: LDH: 448 U/L — ABNORMAL HIGH (ref 98–192)

## 2021-05-30 LAB — RESP PANEL BY RT-PCR (FLU A&B, COVID) ARPGX2
Influenza A by PCR: NEGATIVE
Influenza B by PCR: NEGATIVE
SARS Coronavirus 2 by RT PCR: NEGATIVE

## 2021-05-30 LAB — COMPREHENSIVE METABOLIC PANEL
ALT: 43 U/L (ref 0–44)
AST: 64 U/L — ABNORMAL HIGH (ref 15–41)
Albumin: 4.1 g/dL (ref 3.5–5.0)
Alkaline Phosphatase: 89 U/L (ref 38–126)
Anion gap: 8 (ref 5–15)
BUN: 9 mg/dL (ref 6–20)
CO2: 24 mmol/L (ref 22–32)
Calcium: 8.8 mg/dL — ABNORMAL LOW (ref 8.9–10.3)
Chloride: 103 mmol/L (ref 98–111)
Creatinine, Ser: 0.6 mg/dL — ABNORMAL LOW (ref 0.61–1.24)
GFR, Estimated: >60 mL/min (ref >60)
Glucose, Bld: 154 mg/dL — ABNORMAL HIGH (ref 70–99)
Potassium: 3.8 mmol/L (ref 3.5–5.1)
Sodium: 135 mmol/L (ref 135–145)
Total Bilirubin: 6.7 mg/dL — ABNORMAL HIGH (ref 0.3–1.2)
Total Protein: 8.2 g/dL — ABNORMAL HIGH (ref 6.5–8.1)

## 2021-05-30 LAB — CBC WITH DIFFERENTIAL/PLATELET
Abs Immature Granulocytes: 0.13 10*3/uL — ABNORMAL HIGH (ref 0.00–0.07)
Basophils Absolute: 0.1 10*3/uL (ref 0.0–0.1)
Basophils Relative: 0 %
Eosinophils Absolute: 0.1 10*3/uL (ref 0.0–0.5)
Eosinophils Relative: 1 %
HCT: 24.9 % — ABNORMAL LOW (ref 39.0–52.0)
Hemoglobin: 9.1 g/dL — ABNORMAL LOW (ref 13.0–17.0)
Immature Granulocytes: 1 %
Lymphocytes Relative: 8 %
Lymphs Abs: 1.4 10*3/uL (ref 0.7–4.0)
MCH: 31.7 pg (ref 26.0–34.0)
MCHC: 36.5 g/dL — ABNORMAL HIGH (ref 30.0–36.0)
MCV: 86.8 fL (ref 80.0–100.0)
Monocytes Absolute: 2.8 10*3/uL — ABNORMAL HIGH (ref 0.1–1.0)
Monocytes Relative: 16 %
Neutro Abs: 13.4 10*3/uL — ABNORMAL HIGH (ref 1.7–7.7)
Neutrophils Relative %: 74 %
Platelets: 294 10*3/uL (ref 150–400)
RBC: 2.87 MIL/uL — ABNORMAL LOW (ref 4.22–5.81)
RDW: 26.4 % — ABNORMAL HIGH (ref 11.5–15.5)
WBC: 17.9 10*3/uL — ABNORMAL HIGH (ref 4.0–10.5)
nRBC: 1.5 % — ABNORMAL HIGH (ref 0.0–0.2)

## 2021-05-30 LAB — LACTIC ACID, PLASMA: Lactic Acid, Venous: 1 mmol/L (ref 0.5–1.9)

## 2021-05-30 LAB — URINALYSIS, ROUTINE W REFLEX MICROSCOPIC
Bacteria, UA: NONE SEEN
Bilirubin Urine: NEGATIVE
Glucose, UA: NEGATIVE mg/dL
Ketones, ur: NEGATIVE mg/dL
Leukocytes,Ua: NEGATIVE
Nitrite: NEGATIVE
Protein, ur: NEGATIVE mg/dL
Specific Gravity, Urine: >1.046 — ABNORMAL HIGH (ref 1.005–1.030)
pH: 6 (ref 5.0–8.0)

## 2021-05-30 LAB — TROPONIN I (HIGH SENSITIVITY)
Troponin I (High Sensitivity): 8 ng/L (ref <18)
Troponin I (High Sensitivity): 8 ng/L (ref <18)

## 2021-05-30 LAB — RETICULOCYTES
Immature Retic Fract: 34.7 % — ABNORMAL HIGH (ref 2.3–15.9)
RBC.: 2.84 MIL/uL — ABNORMAL LOW (ref 4.22–5.81)
Retic Count, Absolute: 353 10*3/uL — ABNORMAL HIGH (ref 19.0–186.0)
Retic Ct Pct: 12.6 % — ABNORMAL HIGH (ref 0.4–3.1)

## 2021-05-30 LAB — CREATININE, SERUM
Creatinine, Ser: 0.66 mg/dL (ref 0.61–1.24)
GFR, Estimated: >60 mL/min (ref >60)

## 2021-05-30 LAB — CBC
HCT: 21.3 % — ABNORMAL LOW (ref 39.0–52.0)
Hemoglobin: 7.7 g/dL — ABNORMAL LOW (ref 13.0–17.0)
MCH: 31.6 pg (ref 26.0–34.0)
MCHC: 36.2 g/dL — ABNORMAL HIGH (ref 30.0–36.0)
MCV: 87.3 fL (ref 80.0–100.0)
Platelets: 242 10*3/uL (ref 150–400)
RBC: 2.44 MIL/uL — ABNORMAL LOW (ref 4.22–5.81)
RDW: 26.4 % — ABNORMAL HIGH (ref 11.5–15.5)
WBC: 15 10*3/uL — ABNORMAL HIGH (ref 4.0–10.5)
nRBC: 1.7 % — ABNORMAL HIGH (ref 0.0–0.2)

## 2021-05-30 LAB — GLUCOSE, CAPILLARY: Glucose-Capillary: 96 mg/dL (ref 70–99)

## 2021-05-30 LAB — MYCOPLASMA PNEUMONIAE ANTIBODY, IGM: Mycoplasma pneumo IgM: <770 U/mL (ref 0–769)

## 2021-05-30 LAB — CBG MONITORING, ED
Glucose-Capillary: 102 mg/dL — ABNORMAL HIGH (ref 70–99)
Glucose-Capillary: 135 mg/dL — ABNORMAL HIGH (ref 70–99)

## 2021-05-30 LAB — BLOOD GAS, VENOUS
Acid-Base Excess: 1.4 mmol/L (ref 0.0–2.0)
Bicarbonate: 25.3 mmol/L (ref 20.0–28.0)
O2 Saturation: 90.3 %
Patient temperature: 98.6
pCO2, Ven: 39.1 mmHg — ABNORMAL LOW (ref 44.0–60.0)
pH, Ven: 7.426 (ref 7.250–7.430)
pO2, Ven: 83.6 mmHg — ABNORMAL HIGH (ref 32.0–45.0)

## 2021-05-30 LAB — BILIRUBIN, DIRECT: Bilirubin, Direct: 1.2 mg/dL — ABNORMAL HIGH (ref 0.0–0.2)

## 2021-05-30 IMAGING — DX DG CHEST 1V PORT
1 series · 1 of 1 positions shown · non-contrast
Comparison: [DATE].

CLINICAL DATA: Chest pain.

EXAM:
PORTABLE CHEST 1 VIEW

[chest ap]
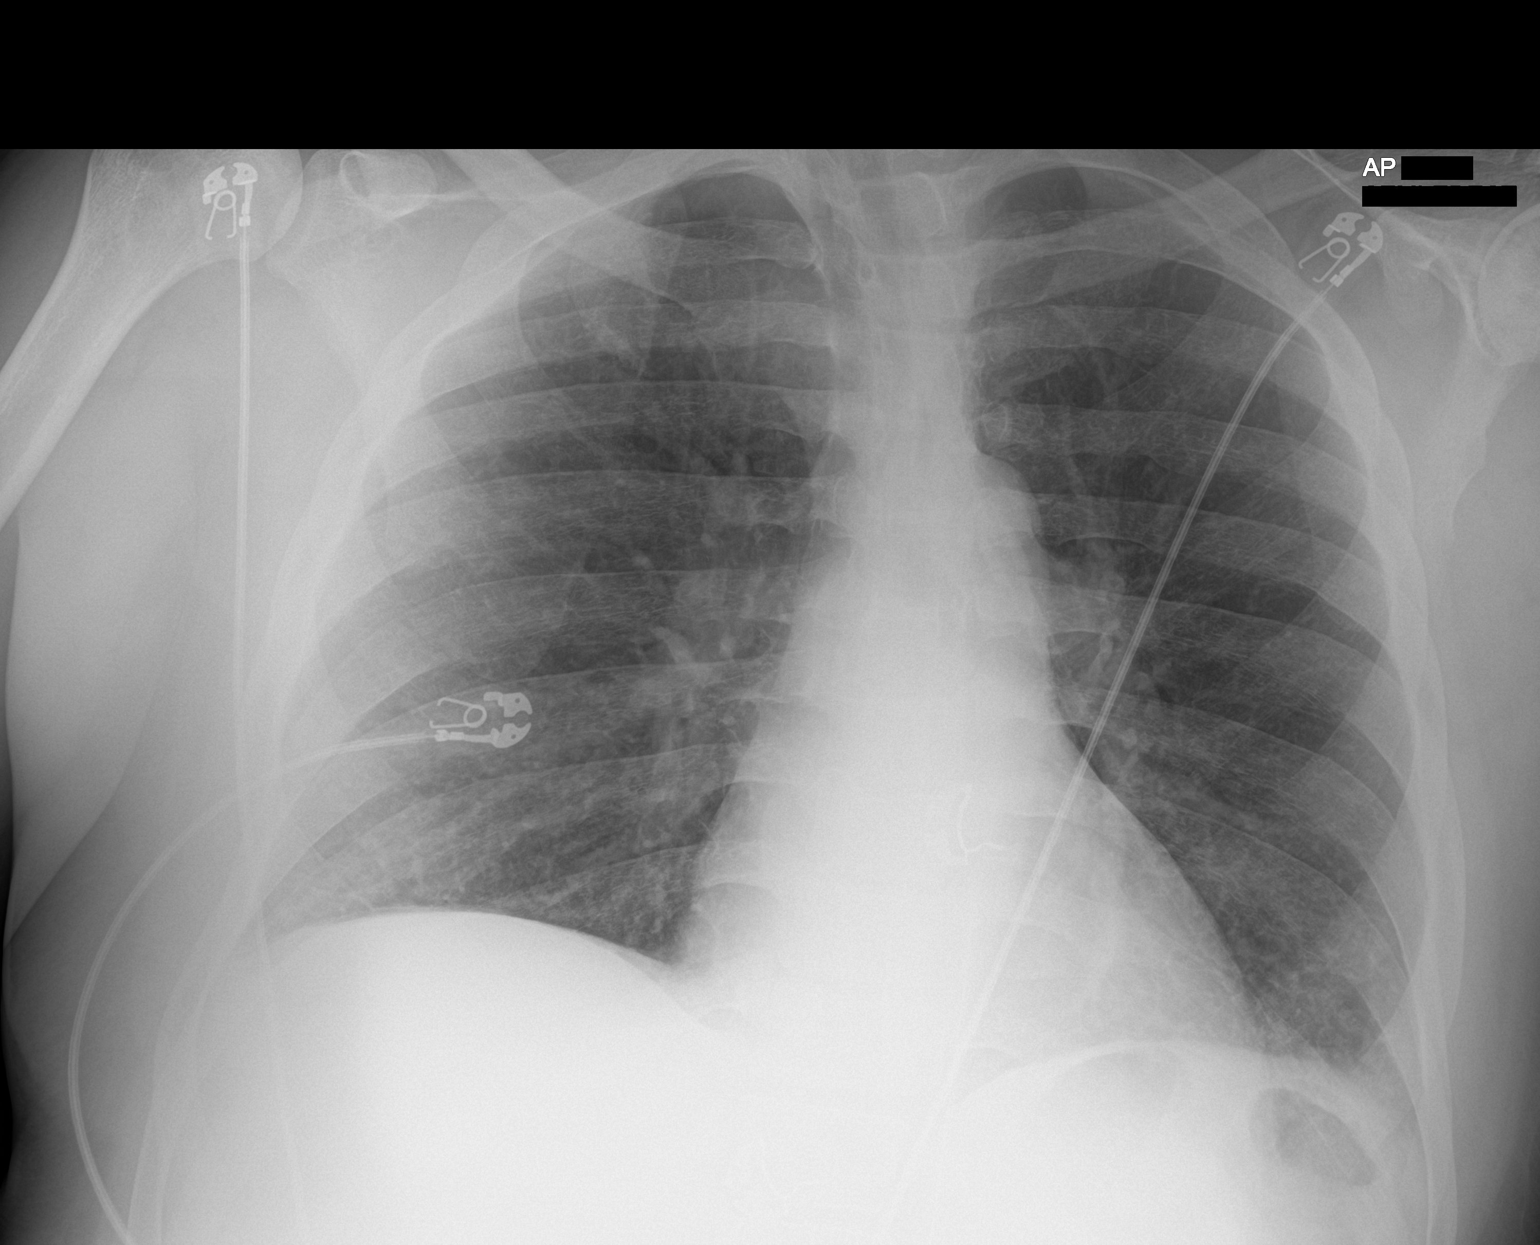

[1 of 1 positions shown; findings below may reference images not displayed]

FINDINGS: The heart size and mediastinal contours are within normal limits.
Mild atelectasis at the lung bases. No effusion or pneumothorax. The
visualized skeletal structures are unremarkable.
IMPRESSION: Mild atelectasis at the lung bases.

## 2021-05-30 IMAGING — CT CT ANGIO CHEST
2 of 6 series · 18 of 36 positions shown · IV contrast (OMNIPAQUE 350)
Comparison: CTA chest [DATE].

CLINICAL DATA: 40-year-old male with sickle cell.  Chest pain.

EXAM:
CT ANGIOGRAPHY CHEST WITH CONTRAST
TECHNIQUE: Multidetector CT imaging of the chest was performed using the
standard protocol during bolus administration of intravenous
contrast. Multiplanar CT image reconstructions and MIPs were
obtained to evaluate the vascular anatomy.
CONTRAST:  80mL OMNIPAQUE IOHEXOL 350 MG/ML SOLN

[Series 5: thins · axial · 0.71mm/px · z∈[+1410,+1639]mm · 17 of 259 slices shown]
[im 15/259  lung]
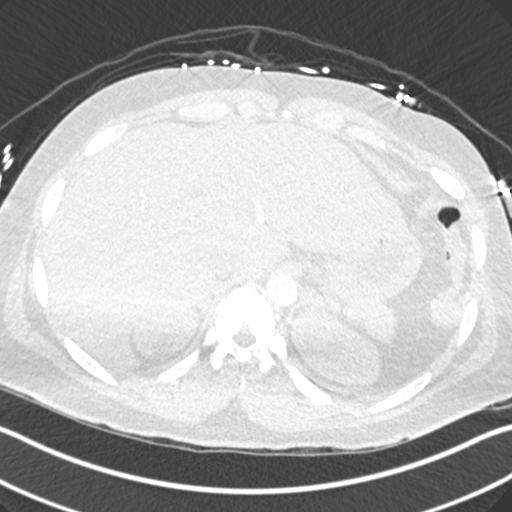
[im 29/259  mediastinal]
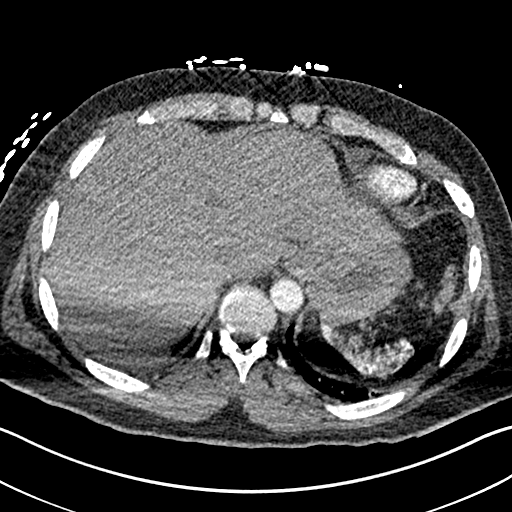
[im 44/259  lung]
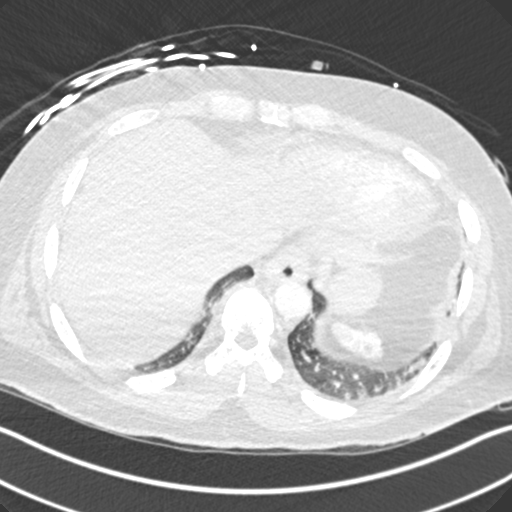
[im 58/259  mediastinal]
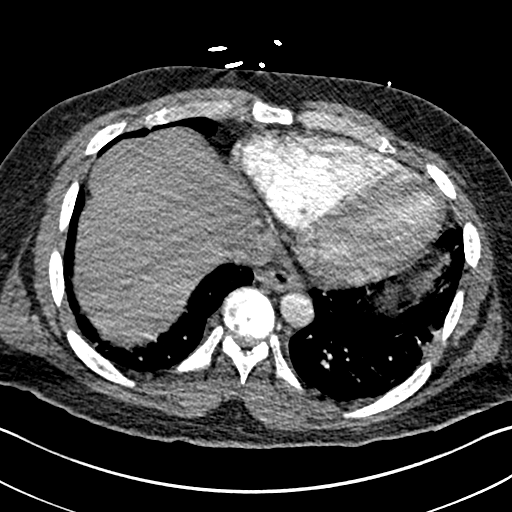
[im 72/259  lung]
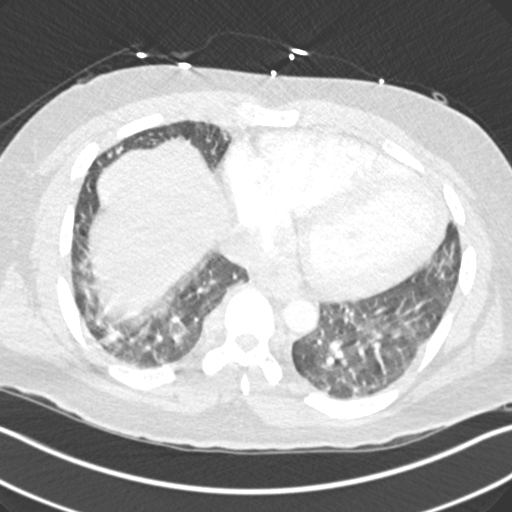
[im 87/259  mediastinal]
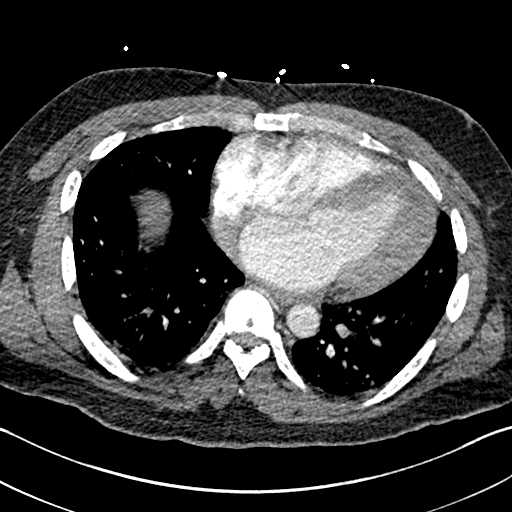
[im 101/259  lung]
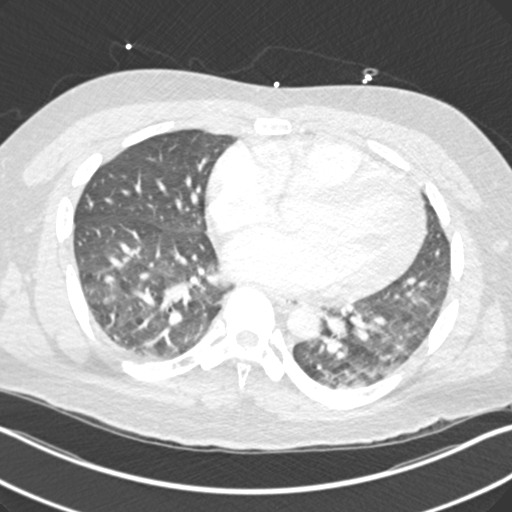
[im 115/259  mediastinal]
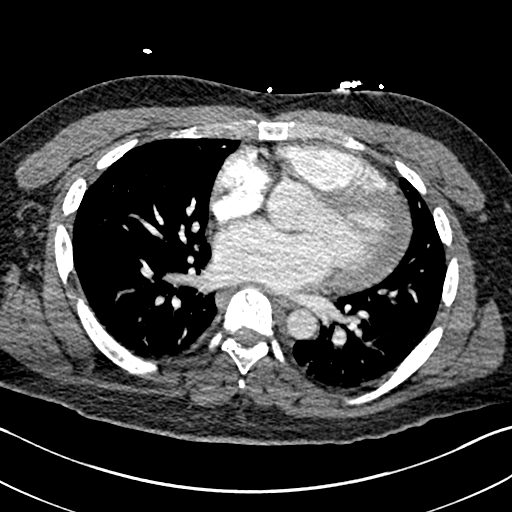
[im 130/259  lung]
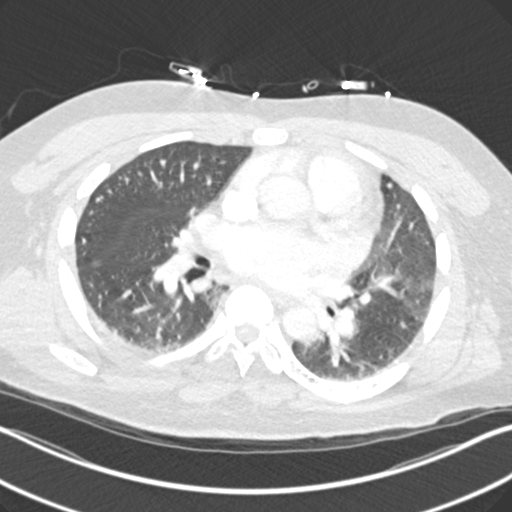
[im 144/259  mediastinal]
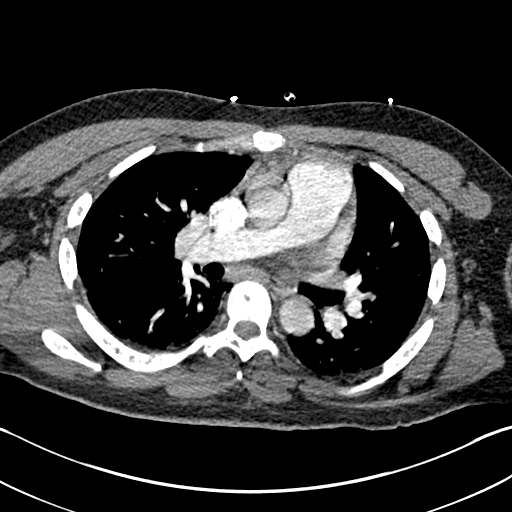
[im 158/259  lung]
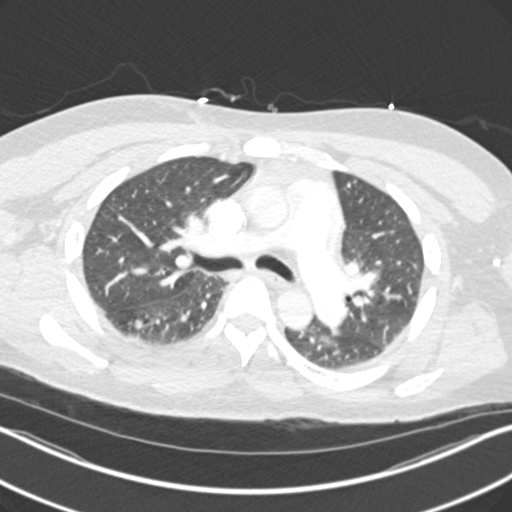
[im 173/259  mediastinal]
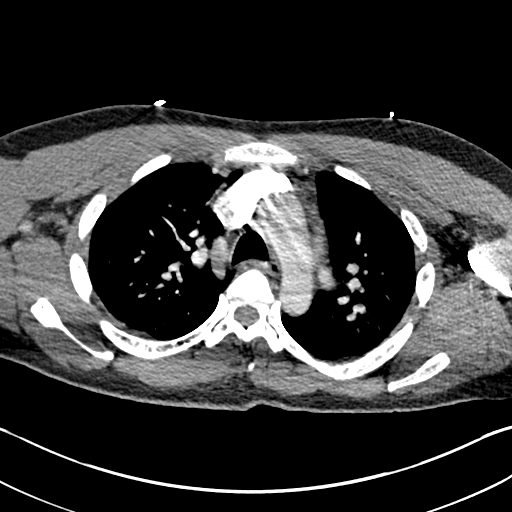
[im 187/259  lung]
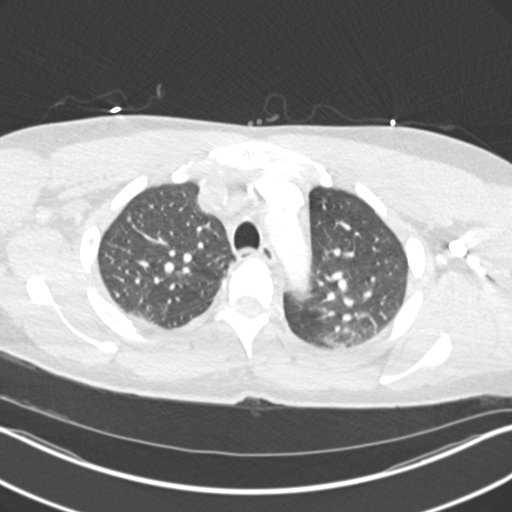
[im 201/259  mediastinal]
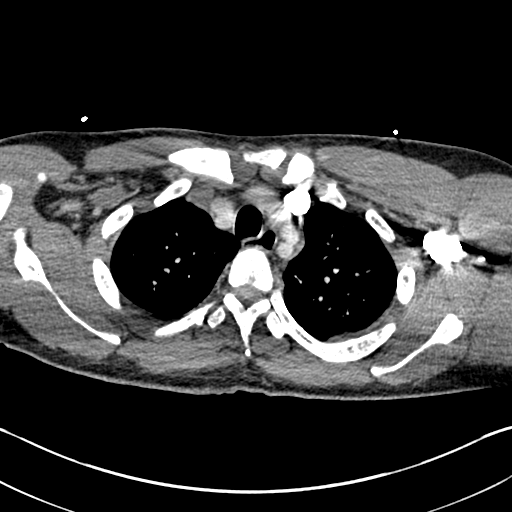
[im 216/259  lung]
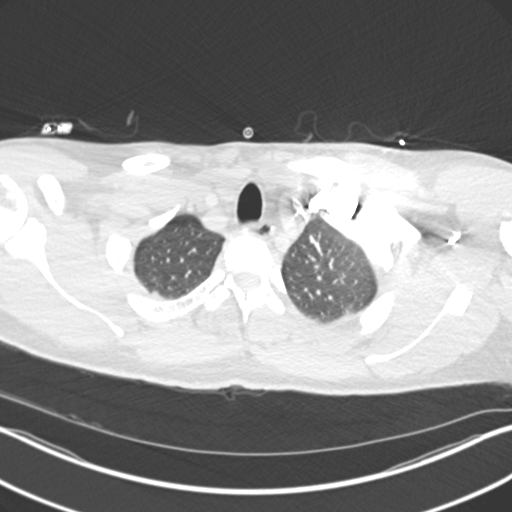
[im 230/259  mediastinal]
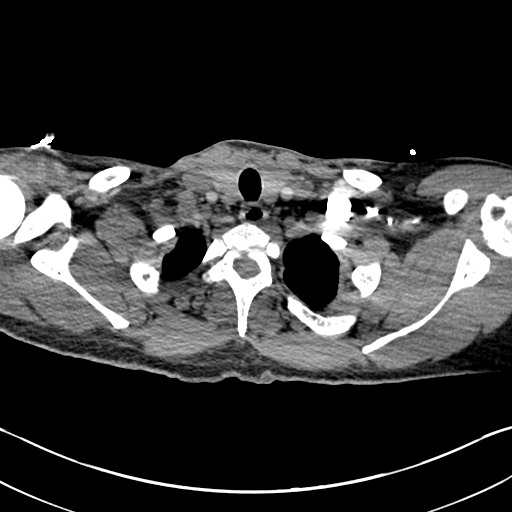
[im 244/259  lung]
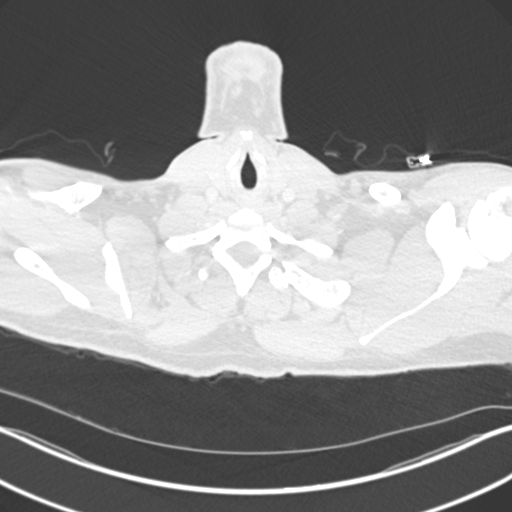

[Series 7: coronal mpr · coronal · 0.51mm/px · 1 of 132 slices shown]
[im 66/132  mediastinal]
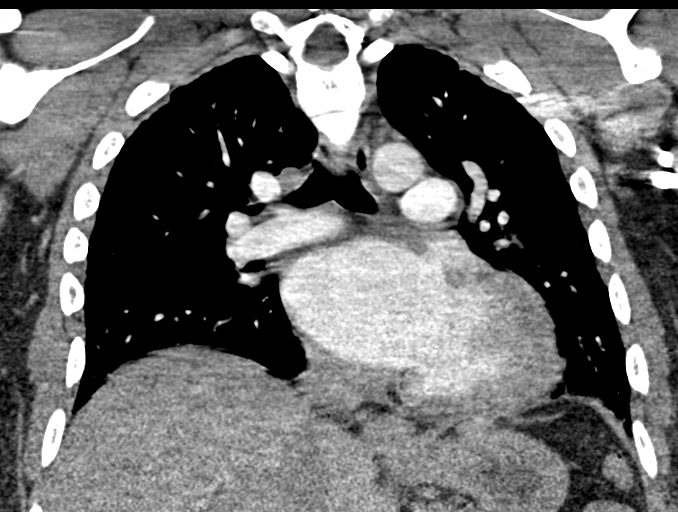

[18 of 36 positions shown; findings below may reference images not displayed]

FINDINGS: Cardiovascular: Suboptimal contrast bolus timing in the pulmonary
arterial tree. Respiratory motion. No central or hilar pulmonary
artery filling defect. But the segmental and distal branches are not
evaluated owing to motion and lack of contrast. Mild cardiomegaly.
No pericardial effusion. Negative visible aorta.

Mediastinum/Nodes: Negative, no mediastinal lymphadenopathy.

Lungs/Pleura: Low lung volumes but mildly improved compared to
earlier this month. Major airways remain patent. Perihilar and
dependent crowding of lung markings, but more indeterminate,
confluent peribronchial ground-glass opacity now in the left upper
lobe (series 6, image 44) and in both lower lobes. No consolidation.
No pleural effusion.

Upper Abdomen: Partially calcified atrophic spleen again noted.
Negative visible liver, kidneys, adrenal glands, and bowel in the
upper abdomen.

Musculoskeletal: Sickle cell related abnormal bone mineralization.
No acute or suspicious osseous lesion identified.

Review of the MIP images confirms the above findings.
IMPRESSION: 1. Suboptimal contrast bolus timing and respiratory motion. No
central or hilar pulmonary embolus, but the segmental and distal
branches are not evaluated.
2. Similar low lung volumes with some atelectasis. But peribronchial
ground-glass opacity in the left upper and both lower lobes now is
more indeterminate for atelectasis versus inflammation/infection. No
pleural effusion.
3. Sequelae of Sickle Cell Disease.  Mild cardiomegaly.

## 2021-05-30 MED ORDER — SODIUM CHLORIDE 0.45 % IV SOLN: INTRAVENOUS | Status: DC

## 2021-05-30 MED ORDER — POLYETHYLENE GLYCOL 3350 17 G PO PACK: 17.0000 g | PACK | Freq: Every day | ORAL | Status: DC | PRN

## 2021-05-30 MED ORDER — ENOXAPARIN SODIUM 40 MG/0.4ML IJ SOSY
40.0000 mg | PREFILLED_SYRINGE | INTRAMUSCULAR | Status: DC
  Administered 2021-05-30 – 2021-06-02 (×4): 40 mg via SUBCUTANEOUS
  Filled 2021-05-30 (×4): qty 0.4

## 2021-05-30 MED ORDER — HYDROMORPHONE 1 MG/ML IV SOLN
INTRAVENOUS | Status: DC
  Administered 2021-05-30: 0.5 mg via INTRAVENOUS
  Administered 2021-05-30: 30 mg via INTRAVENOUS
  Administered 2021-05-31 – 2021-06-02 (×8): 0 mg via INTRAVENOUS
  Administered 2021-06-02: 0 mL via INTRAVENOUS
  Filled 2021-05-30: qty 30

## 2021-05-30 MED ORDER — SODIUM CHLORIDE 0.9 % IV SOLN: INTRAVENOUS | Status: DC | PRN

## 2021-05-30 MED ORDER — HYDROMORPHONE HCL 2 MG/ML IJ SOLN
2.0000 mg | INTRAMUSCULAR | Status: AC | PRN
  Administered 2021-05-30: 12:00:00 2 mg via INTRAVENOUS
  Filled 2021-05-30: qty 1

## 2021-05-30 MED ORDER — METHADONE HCL 10 MG PO TABS
10.0000 mg | ORAL_TABLET | Freq: Three times a day (TID) | ORAL | Status: DC
  Administered 2021-05-30 – 2021-06-03 (×12): 10 mg via ORAL
  Filled 2021-05-30: qty 1
  Filled 2021-05-30: qty 2
  Filled 2021-05-30 (×10): qty 1

## 2021-05-30 MED ORDER — CEFEPIME HCL 2 G IJ SOLR
2.0000 g | Freq: Three times a day (TID) | INTRAMUSCULAR | Status: DC
  Administered 2021-05-30 – 2021-06-02 (×9): 2 g via INTRAVENOUS
  Filled 2021-05-30 (×12): qty 2

## 2021-05-30 MED ORDER — ONDANSETRON HCL 4 MG/2ML IJ SOLN
4.0000 mg | Freq: Four times a day (QID) | INTRAMUSCULAR | Status: DC | PRN
  Administered 2021-05-31 – 2021-06-02 (×3): 4 mg via INTRAVENOUS
  Filled 2021-05-30 (×3): qty 2

## 2021-05-30 MED ORDER — IOHEXOL 350 MG/ML SOLN
80.0000 mL | Freq: Once | INTRAVENOUS | Status: AC | PRN
  Administered 2021-05-30: 06:00:00 80 mL via INTRAVENOUS

## 2021-05-30 MED ORDER — HYDROMORPHONE HCL 2 MG/ML IJ SOLN
2.0000 mg | INTRAMUSCULAR | Status: AC
  Administered 2021-05-30: 04:00:00 2 mg via INTRAVENOUS
  Filled 2021-05-30: qty 1

## 2021-05-30 MED ORDER — HYDROMORPHONE HCL 2 MG/ML IJ SOLN
2.0000 mg | INTRAMUSCULAR | Status: DC | PRN
  Administered 2021-05-30: 18:00:00 2 mg via INTRAVENOUS
  Filled 2021-05-30: qty 1

## 2021-05-30 MED ORDER — SODIUM CHLORIDE 0.9 % IV SOLN
25.0000 mg | INTRAVENOUS | Status: DC | PRN
  Filled 2021-05-30: qty 0.5

## 2021-05-30 MED ORDER — ONDANSETRON HCL 4 MG/2ML IJ SOLN
4.0000 mg | INTRAMUSCULAR | Status: DC | PRN
  Administered 2021-05-30: 04:00:00 4 mg via INTRAVENOUS
  Filled 2021-05-30: qty 2

## 2021-05-30 MED ORDER — KETOROLAC TROMETHAMINE 15 MG/ML IJ SOLN
15.0000 mg | Freq: Four times a day (QID) | INTRAMUSCULAR | Status: DC
  Administered 2021-05-30 – 2021-06-03 (×16): 15 mg via INTRAVENOUS
  Filled 2021-05-30 (×16): qty 1

## 2021-05-30 MED ORDER — SODIUM CHLORIDE 0.9% FLUSH: 9.0000 mL | INTRAVENOUS | Status: DC | PRN

## 2021-05-30 MED ORDER — KETOROLAC TROMETHAMINE 15 MG/ML IJ SOLN
15.0000 mg | INTRAMUSCULAR | Status: AC
  Administered 2021-05-30: 04:00:00 15 mg via INTRAVENOUS
  Filled 2021-05-30: qty 1

## 2021-05-30 MED ORDER — HYDROMORPHONE HCL 2 MG/ML IJ SOLN
INTRAMUSCULAR | Status: AC
  Administered 2021-05-30: 16:00:00 2 mg
  Filled 2021-05-30: qty 1

## 2021-05-30 MED ORDER — SODIUM CHLORIDE (PF) 0.9 % IJ SOLN
INTRAMUSCULAR | Status: AC
  Filled 2021-05-30: qty 50

## 2021-05-30 MED ORDER — SODIUM CHLORIDE 0.9 % IV SOLN
1.0000 g | Freq: Once | INTRAVENOUS | Status: AC
  Administered 2021-05-30: 07:00:00 1 g via INTRAVENOUS
  Filled 2021-05-30: qty 10

## 2021-05-30 MED ORDER — DIPHENHYDRAMINE HCL 50 MG/ML IJ SOLN
25.0000 mg | Freq: Once | INTRAMUSCULAR | Status: AC
  Administered 2021-05-30: 04:00:00 25 mg via INTRAVENOUS
  Filled 2021-05-30: qty 1

## 2021-05-30 MED ORDER — INSULIN ASPART 100 UNIT/ML IJ SOLN
0.0000 [IU] | Freq: Three times a day (TID) | INTRAMUSCULAR | Status: DC
  Filled 2021-05-30: qty 0.15

## 2021-05-30 MED ORDER — DIPHENHYDRAMINE HCL 25 MG PO CAPS
25.0000 mg | ORAL_CAPSULE | ORAL | Status: DC | PRN
  Filled 2021-05-30: qty 1

## 2021-05-30 MED ORDER — SENNOSIDES-DOCUSATE SODIUM 8.6-50 MG PO TABS
1.0000 | ORAL_TABLET | Freq: Two times a day (BID) | ORAL | Status: DC
  Administered 2021-05-30 – 2021-06-03 (×6): 1 via ORAL
  Filled 2021-05-30 (×9): qty 1

## 2021-05-30 MED ORDER — NALOXONE HCL 0.4 MG/ML IJ SOLN: 0.4000 mg | INTRAMUSCULAR | Status: DC | PRN

## 2021-05-30 MED ORDER — INSULIN ASPART 100 UNIT/ML IJ SOLN
0.0000 [IU] | Freq: Every day | INTRAMUSCULAR | Status: DC
  Filled 2021-05-30: qty 0.05

## 2021-05-30 MED ORDER — LISINOPRIL 5 MG PO TABS
2.5000 mg | ORAL_TABLET | Freq: Every day | ORAL | Status: DC
  Administered 2021-05-30 – 2021-06-03 (×2): 2.5 mg via ORAL
  Filled 2021-05-30 (×6): qty 1

## 2021-05-30 MED ORDER — DEXTROSE-NACL 5-0.45 % IV SOLN: INTRAVENOUS | Status: DC

## 2021-05-30 NOTE — Plan of Care (Signed)
Plan of care discussed.   

## 2021-05-30 NOTE — ED Triage Notes (Signed)
Pt reports with SCC and chest pain since today. Pt reports taking his pain medication right before he came in.

## 2021-05-30 NOTE — Progress Notes (Signed)
Recent discharge for PNA and SCC on 12/16. Yesterday woke up with fatigue. Worsening throughout the day. Developed dyspnea and chest pain. Presented with hypoxia (RA in high 80's). PE study is negative. ?PNA. Worsening leukocytosis. Hgb down 1 gram and bili is up. Now on rocephin. COVID pending.

## 2021-05-30 NOTE — H&P (Signed)
Michael Mullins is an 40 y.o. male.   Chief Complaint: Chest pain with shortness of breath  HPI: Patient is a 40 year old gentleman with history of sickle cell disease who was just discharged yesterday after admission with sickle cell painful crisis.  Patient was discharged on oral antibiotics.  He said he felt better when he arrived home initially.  Subsequently however he started feeling worse.  Patient now presented to the ER with worsening chest pain and shortness of breath.  Also pain all over consistent with his sickle cell crisis.  Evaluation in the ER with repeat imaging confirms worsening pneumonia.  Patient being readmitted with healthcare associated pneumonia most likely.  He has no fever but still has leukocytosis.  Patient's symptoms also relapsed back into sickle cell crisis.  Past Medical History:  Diagnosis Date   Diabetes mellitus without complication (HCC)    Sickle cell anemia (HCC)     History reviewed. No pertinent surgical history.  History reviewed. No pertinent family history. Social History:  reports that he has never smoked. He has never used smokeless tobacco. He reports that he does not currently use alcohol. He reports that he does not currently use drugs.  Allergies:  Allergies  Allergen Reactions   Morphine Hives    Tolerates hydromorphone per MAR   Vancomycin Itching    Tolerates with longer infusion times and premedcation with IV bendryl    (Not in a hospital admission)   Results for orders placed or performed during the hospital encounter of 05/30/21 (from the past 48 hour(s))  CBC with Differential     Status: Abnormal   Collection Time: 05/30/21  3:17 AM  Result Value Ref Range   WBC 17.9 (H) 4.0 - 10.5 K/uL   RBC 2.87 (L) 4.22 - 5.81 MIL/uL   Hemoglobin 9.1 (L) 13.0 - 17.0 g/dL   HCT 84.6 (L) 65.9 - 93.5 %   MCV 86.8 80.0 - 100.0 fL   MCH 31.7 26.0 - 34.0 pg   MCHC 36.5 (H) 30.0 - 36.0 g/dL   RDW 70.1 (H) 77.9 - 39.0 %   Platelets 294 150 -  400 K/uL   nRBC 1.5 (H) 0.0 - 0.2 %   Neutrophils Relative % 74 %   Neutro Abs 13.4 (H) 1.7 - 7.7 K/uL   Lymphocytes Relative 8 %   Lymphs Abs 1.4 0.7 - 4.0 K/uL   Monocytes Relative 16 %   Monocytes Absolute 2.8 (H) 0.1 - 1.0 K/uL   Eosinophils Relative 1 %   Eosinophils Absolute 0.1 0.0 - 0.5 K/uL   Basophils Relative 0 %   Basophils Absolute 0.1 0.0 - 0.1 K/uL   Immature Granulocytes 1 %   Abs Immature Granulocytes 0.13 (H) 0.00 - 0.07 K/uL   Carollee Massed Bodies PRESENT    Polychromasia PRESENT    Sickle Cells PRESENT    Target Cells PRESENT     Comment: Performed at [facility], [address] [place], [place] [address]  Comprehensive metabolic panel     Status: Abnormal   Collection Time: 05/30/21  3:17 AM  Result Value Ref Range   Sodium 135 135 - 145 mmol/L   Potassium 3.8 3.5 - 5.1 mmol/L   Chloride 103 98 - 111 mmol/L   CO2 24 22 - 32 mmol/L   Glucose, Bld 154 (H) 70 - 99 mg/dL    Comment: Glucose reference range applies only to samples taken after fasting for at least 8 hours.  BUN 9 6 - 20 mg/dL   Creatinine, Ser 4.12 (L) 0.61 - 1.24 mg/dL   Calcium 8.8 (L) 8.9 - 10.3 mg/dL   Total Protein 8.2 (H) 6.5 - 8.1 g/dL   Albumin 4.1 3.5 - 5.0 g/dL   AST 64 (H) 15 - 41 U/L   ALT 43 0 - 44 U/L   Alkaline Phosphatase 89 38 - 126 U/L   Total Bilirubin 6.7 (H) 0.3 - 1.2 mg/dL   GFR, Estimated >87 >86 mL/min    Comment: (NOTE) Calculated using the CKD-EPI Creatinine Equation (2021)    Anion gap 8 5 - 15    Comment: Performed at [facility], [address] [place], [place] [address]  Reticulocytes     Status: Abnormal   Collection Time: 05/30/21  3:17 AM  Result Value Ref Range   Retic Ct Pct 12.6 (H) 0.4 - 3.1 %   RBC. 2.84 (L) 4.22 - 5.81 MIL/uL   Retic Count, Absolute 353.0 (H) 19.0 - 186.0 K/uL   Immature Retic Fract 34.7 (H) 2.3 - 15.9 %    Comment: Performed at [facility], [address],  [place], [place] [address]  Troponin I (High Sensitivity)     Status: None   Collection Time: 05/30/21  3:17 AM  Result Value Ref Range   Troponin I (High Sensitivity) 8 <18 ng/L    Comment: (NOTE) Elevated high sensitivity troponin I (hsTnI) values and significant  changes across serial measurements may suggest ACS but many other  chronic and acute conditions are known to elevate hsTnI results.  Refer to the "Links" section for chest pain algorithms and additional  guidance. Performed at [facility], [address], [place], [place] [address]   Lactic acid, plasma     Status: None   Collection Time: 05/30/21  3:17 AM  Result Value Ref Range   Lactic Acid, Venous 1.0 0.5 - 1.9 mmol/L    Comment: Performed at [facility], [address], [place], [place] [address]  Blood gas, venous     Status: Abnormal   Collection Time: 05/30/21  3:48 AM  Result Value Ref Range   pH, Ven 7.426 7.250 - 7.430   pCO2, Ven 39.1 (L) 44.0 - 60.0 mmHg   pO2, Ven 83.6 (H) 32.0 - 45.0 mmHg   Bicarbonate 25.3 20.0 - 28.0 mmol/L   Acid-Base Excess 1.4 0.0 - 2.0 mmol/L   O2 Saturation 90.3 %   Patient temperature 98.6     Comment: Performed at [facility], [address], [place], [place] [address]  Troponin I (High Sensitivity)     Status: None   Collection Time: 05/30/21  4:30 AM  Result Value Ref Range   Troponin I (High Sensitivity) 8 <18 ng/L    Comment: (NOTE) Elevated high sensitivity troponin I (hsTnI) values and significant  changes across serial measurements may suggest ACS but many other  chronic and acute conditions are known to elevate hsTnI results.  Refer to the "Links" section for chest pain algorithms and additional  guidance. Performed at [facility], [address] [place], [place] [address]   Bilirubin, direct     Status: Abnormal   Collection Time: 05/30/21  4:30 AM  Result Value Ref Range   Bilirubin,  Direct 1.2 (H) 0.0 - 0.2 mg/dL    Comment: Performed at [facility], [address] [place], [place] [address]  Lactate dehydrogenase  Status: Abnormal   Collection Time: 05/30/21  4:30 AM  Result Value Ref Range   LDH 448 (H) 98 - 192 U/L    Comment: Performed at [facility], [address] [place], [place] [address]  Resp Panel by RT-PCR (Flu A&B, Covid) Nasopharyngeal Swab     Status: None   Collection Time: 05/30/21  7:21 AM   Specimen: Nasopharyngeal Swab; Nasopharyngeal(NP) swabs in vial transport medium  Result Value Ref Range   SARS Coronavirus 2 by RT PCR NEGATIVE NEGATIVE    Comment: (NOTE) SARS-CoV-2 target nucleic acids are NOT DETECTED.  The SARS-CoV-2 RNA is generally detectable in upper respiratory specimens during the acute phase of infection. The lowest concentration of SARS-CoV-2 viral copies this assay can detect is 138 copies/mL. A negative result does not preclude SARS-Cov-2 infection and should not be used as the sole basis for treatment or other patient management decisions. A negative result may occur with  improper specimen collection/handling, submission of specimen other than nasopharyngeal swab, presence of viral mutation(s) within the areas targeted by this assay, and inadequate number of viral copies(<138 copies/mL). A negative result must be combined with clinical observations, patient history, and epidemiological information. The expected result is Negative.  Fact Sheet for Patients:  BloggerCourse.com  Fact Sheet for Healthcare Providers:  SeriousBroker.it  This test is no t yet approved or cleared by the [place] FDA and  has been authorized for detection and/or diagnosis of SARS-CoV-2 by FDA under an Emergency Use Authorization (EUA). This EUA will remain  in effect (meaning this test can be used) for the duration of the COVID-19 declaration under  Section 564(b)(1) of the Act, 21 U.S.C.section 360bbb-3(b)(1), unless the authorization is terminated  or revoked sooner.       Influenza A by PCR NEGATIVE NEGATIVE   Influenza B by PCR NEGATIVE NEGATIVE    Comment: (NOTE) The Xpert Xpress SARS-CoV-2/FLU/RSV plus assay is intended as an aid in the diagnosis of influenza from Nasopharyngeal swab specimens and should not be used as a sole basis for treatment. Nasal washings and aspirates are unacceptable for Xpert Xpress SARS-CoV-2/FLU/RSV testing.  Fact Sheet for Patients: BloggerCourse.com  Fact Sheet for Healthcare Providers: SeriousBroker.it  This test is not yet approved or cleared by the [place] FDA and has been authorized for detection and/or diagnosis of SARS-CoV-2 by FDA under an Emergency Use Authorization (EUA). This EUA will remain in effect (meaning this test can be used) for the duration of the COVID-19 declaration under Section 564(b)(1) of the Act, 21 U.S.C. section 360bbb-3(b)(1), unless the authorization is terminated or revoked.  Performed at [facility], [address], [place], [place] [address]    CT Angio Chest PE W and/or Wo Contrast  Result Date: 05/30/2021 CLINICAL DATA:  40 year old male with sickle cell.  Chest pain. EXAM: CT ANGIOGRAPHY CHEST WITH CONTRAST TECHNIQUE: Multidetector CT imaging of the chest was performed using the standard protocol during bolus administration of intravenous contrast. Multiplanar CT image reconstructions and MIPs were obtained to evaluate the vascular anatomy. CONTRAST:  44mL OMNIPAQUE IOHEXOL 350 MG/ML SOLN COMPARISON:  CTA chest 05/26/2021. FINDINGS: Cardiovascular: Suboptimal contrast bolus timing in the pulmonary arterial tree. Respiratory motion. No central or hilar pulmonary artery filling defect. But the segmental and distal branches are not evaluated owing to motion and lack of contrast. Mild  cardiomegaly. No pericardial effusion. Negative visible aorta. Mediastinum/Nodes: Negative, no mediastinal lymphadenopathy. Lungs/Pleura: Low lung volumes but mildly improved compared to earlier  this month. Major airways remain patent. Perihilar and dependent crowding of lung markings, but more indeterminate, confluent peribronchial ground-glass opacity now in the left upper lobe (series 6, image 44) and in both lower lobes. No consolidation. No pleural effusion. Upper Abdomen: Partially calcified atrophic spleen again noted. Negative visible liver, kidneys, adrenal glands, and bowel in the upper abdomen. Musculoskeletal: Sickle cell related abnormal bone mineralization. No acute or suspicious osseous lesion identified. Review of the MIP images confirms the above findings. IMPRESSION: 1. Suboptimal contrast bolus timing and respiratory motion. No central or hilar pulmonary embolus, but the segmental and distal branches are not evaluated. 2. Similar low lung volumes with some atelectasis. But peribronchial ground-glass opacity in the left upper and both lower lobes now is more indeterminate for atelectasis versus inflammation/infection. No pleural effusion. 3. Sequelae of Sickle Cell Disease.  Mild cardiomegaly. Electronically Signed   By: Odessa Fleming M.D.   On: 05/30/2021 06:19   DG Chest Portable 1 View  Result Date: 05/30/2021 CLINICAL DATA:  Chest pain. EXAM: PORTABLE CHEST 1 VIEW COMPARISON:  05/25/2021. FINDINGS: The heart size and mediastinal contours are within normal limits. Mild atelectasis at the lung bases. No effusion or pneumothorax. The visualized skeletal structures are unremarkable. IMPRESSION: Mild atelectasis at the lung bases. Electronically Signed   By: Thornell Sartorius M.D.   On: 05/30/2021 02:55    Review of Systems  Constitutional:  Positive for fatigue. Negative for fever.  HENT: Negative.    Eyes: Negative.   Respiratory:  Positive for shortness of breath.   Cardiovascular: Negative.    Gastrointestinal: Negative.   Endocrine: Negative.   Genitourinary: Negative.   Musculoskeletal:  Positive for myalgias.  Skin: Negative.   Neurological: Negative.   Hematological: Negative.   Psychiatric/Behavioral: Negative.     Blood pressure 97/62, pulse (!) 59, resp. rate 13, SpO2 98 %. Physical Exam Constitutional:      Appearance: He is ill-appearing.  HENT:     Head: Normocephalic and atraumatic.  Eyes:     Extraocular Movements: Extraocular movements intact.     Pupils: Pupils are equal, round, and reactive to light.  Cardiovascular:     Rate and Rhythm: Normal rate and regular rhythm.     Heart sounds: Normal heart sounds.  Pulmonary:     Effort: Pulmonary effort is normal. No tachypnea or respiratory distress.     Breath sounds: Decreased breath sounds present.  Abdominal:     General: Bowel sounds are normal.     Palpations: Abdomen is soft.  Musculoskeletal:        General: Normal range of motion.     Cervical back: Normal range of motion and neck supple.  Skin:    General: Skin is warm and dry.  Neurological:     General: No focal deficit present.     Mental Status: He is alert.     Assessment/Plan A 40 year old gentleman being admitted with healthcare associated pneumonia and sickle cell crisis.  #1 Healthcare associated pneumonia: Patient will be initiated on IV cefepime.  His MRSA screen recently is negative so we will hold off on vancomycin.  Optimal cultures.  Breathing treatment and supportive care.  Continue oxygen as needed.  #2 sickle cell painful crisis: We will initiate Dilaudid PCA.  Continue home regimen.  Toradol and IV fluids.  #3 anemia of chronic disease: Continue to monitor H&H.  #4 controlled diabetes: Initiate sliding scale insulin.  #5 chronic pain syndrome: Continue chronic home regimen.  Lonia Blood, MD  05/30/2021, 10:24 AM

## 2021-05-30 NOTE — Progress Notes (Signed)
Pharmacy Antibiotic Note  Michael Mullins is a 40 y.o. male admitted on 05/30/2021 with pneumonia.  Pharmacy has been consulted for cefepime dosing.  Worsening dypsnea and fatigue after recent PNA treatment. Received ceftriaxone this morning. WBC elevated at 17.9. MRSA PCR a few days ago was negative.  Plan: Start cefepime 2g IV Q8h Monitor clinical picture, renal function F/U bx deescalation / LOT   Recent Labs  Lab 05/25/21 2104 05/26/21 0350 05/26/21 0530 05/28/21 1354 05/30/21 0317  WBC 14.4* 14.4*  --  12.7* 17.9*  CREATININE 0.68 0.72  --  0.49* 0.60*  LATICACIDVEN  --   --  0.9  --  1.0    Estimated Creatinine Clearance: 148.9 mL/min (A) (by C-G formula based on SCr of 0.6 mg/dL (L)).    Allergies  Allergen Reactions   Morphine Hives    Tolerates hydromorphone per MAR   Vancomycin Itching    Tolerates with longer infusion times and premedcation with IV bendryl    Antimicrobials this admission: Cefepime 12/21 >>   Dose adjustments this admission:   Microbiology results: 12/21: Flu and COVID negative  Thank you for allowing pharmacy to be a part of this patients care.  Enzo Bi, PharmD, BCPS, BCIDP Clinical Pharmacist 05/30/2021 10:36 AM

## 2021-05-30 NOTE — ED Provider Notes (Signed)
[facility]-EMERGENCY DEPT Provider Note  CSN: [id number] Arrival date & time: 05/30/21 0216  Chief Complaint(s) Chest Pain and Sickle Cell Pain Crisis  HPI Michael Mullins is a 40 y.o. male with a past medical history listed below including sickle cell disease who was recently admitted and discharged from the hospital for community-acquired pneumonia who is currently on antibiotics returns today for worsening generalized fatigue over the past day he developed chest pain and lower back pain few hours ago.  Chest pain is moderate to severe aching worse with movement and palpation.  Denies any associated shortness of breath.  No nausea or vomiting.  No abdominal pain or diarrhea.  Reports that he has been compliant with his home medications except his [place] Pepper which he has not taken since being admitted to the hospital.   Chest Pain Sickle Cell Pain Crisis Associated symptoms: chest pain    Past Medical History Past Medical History:  Diagnosis Date   Diabetes mellitus without complication (HCC)    Sickle cell anemia (HCC)    Patient Active Problem List   Diagnosis Date Noted   Sickle cell crisis (HCC) 05/30/2021   Acute respiratory failure with hypoxia (HCC) 05/26/2021   SOB (shortness of breath) 05/26/2021   CAP (community acquired pneumonia) 05/26/2021   Severe sepsis (HCC) 05/26/2021   Left ankle pain 05/26/2021   Pulmonary nodule 05/26/2021   Diabetes mellitus without complication (HCC)    Sickle cell pain crisis (HCC) 01/23/2021   Ankle ulcer (HCC) 01/23/2021   Controlled type 2 diabetes mellitus with hyperglycemia (HCC) 01/23/2021   FOOT ULCER 01/14/2009   CHEST PAIN 01/14/2009   Home Medication(s) Prior to Admission medications   Medication Sig Start Date End Date Taking? Authorizing Provider  ACCUTANE 40 MG capsule Take 40 mg by mouth daily. 04/30/21   [provider]  amoxicillin-clavulanate (AUGMENTIN) 875-125 MG tablet Take 1 tablet by  mouth 2 (two) times daily for 7 days. 05/28/21 06/05/21  Quentin Angst, MD  collagenase (SANTYL) ointment Apply to the affected area topically daily. Patient not taking: Reported on 05/25/2021 01/26/21   Massie Maroon, FNP  empagliflozin (JARDIANCE) 10 MG TABS tablet Take 1 tablet (10 mg total) by mouth daily. 11/03/20     ergocalciferol (VITAMIN D2) 1.25 MG (50000 UT) capsule Take 1 capsule by mouth every 30 days Patient not taking: Reported on 05/25/2021 08/07/20     ferrous sulfate (FEROSUL) 325 (65 FE) MG tablet Take by mouth. Patient not taking: Reported on 05/25/2021 08/07/20     folic acid (FOLVITE) 1 MG tablet Take 1 tablet by mouth daily Patient taking differently: Take 1 mg by mouth daily. 08/07/20     guaiFENesin-dextromethorphan (ROBITUSSIN DM) 100-10 MG/5ML syrup Take 10 mLs by mouth every 4 (four) hours as needed for cough. 05/28/21   Quentin Angst, MD  lisinopril (ZESTRIL) 2.5 MG tablet Take 1 tablet (2.5 mg total) by mouth daily. 11/03/20     methadone (DOLOPHINE) 10 MG tablet Take 3 tablets by mouth 3 times daily. Patient taking differently: Take 10 mg by mouth every 8 (eight) hours. 05/24/21     naloxone (NARCAN) nasal spray 4 mg/0.1 mL Use as directed every 2-3 minutes until emergency arrives, call 911 Patient not taking: Reported on 05/25/2021 08/07/20     ondansetron (ZOFRAN) 4 MG tablet Take 1 tablet (4 mg total) by mouth every 8 (eight) hours as needed for Nausea / Vomiting. 02/22/21     OXBRYTA 500 MG TABS  tablet Take 1,500 mg by mouth daily. 01/01/21   [provider]  oxycodone (ROXICODONE) 30 MG immediate release tablet Take 1 tablet by mouth every 6 hours as needed for Pain. Patient taking differently: Take 30 mg by mouth every 6 (six) hours as needed for pain. 05/24/21     triamcinolone cream (KENALOG) 0.1 % Apply to affected skin of arms twice daily as needed. NEVER to face/neck/groin. Patient not taking: Reported on 01/19/2021 11/28/20     Vitamin  D, Ergocalciferol, (DRISDOL) 1.25 MG (50000 UNIT) CAPS capsule Take 1 capsule by mouth every 30 days Patient taking differently: Take 50,000 Units by mouth every 30 (thirty) days. 05/24/21                                                                                                                                       Past Surgical History History reviewed. No pertinent surgical history. Family History History reviewed. No pertinent family history.  Social History Social History   Tobacco Use   Smoking status: Never   Smokeless tobacco: Never  Substance Use Topics   Alcohol use: Not Currently   Drug use: Not Currently   Allergies Morphine and Vancomycin  Review of Systems Review of Systems  Cardiovascular:  Positive for chest pain.  All other systems are reviewed and are negative for acute change except as noted in the HPI  Physical Exam Vital Signs  I have reviewed the triage vital signs BP (!) 143/74    Pulse (!) 121    Resp 20    SpO2 89%  on RA  Physical Exam Vitals reviewed.  Constitutional:      General: He is not in acute distress.    Appearance: He is well-developed. He is not diaphoretic.  HENT:     Head: Normocephalic and atraumatic.     Nose: Nose normal.  Eyes:     General: No scleral icterus.       Right eye: No discharge.        Left eye: No discharge.     Conjunctiva/sclera: Conjunctivae normal.     Pupils: Pupils are equal, round, and reactive to light.  Cardiovascular:     Rate and Rhythm: Regular rhythm. Tachycardia present.     Heart sounds: No murmur heard.   No friction rub. No gallop.  Pulmonary:     Effort: Pulmonary effort is normal. No respiratory distress.     Breath sounds: Normal breath sounds. No stridor. No rales.  Chest:     Chest wall: Tenderness present.    Abdominal:     General: There is no distension.     Palpations: Abdomen is soft.     Tenderness: There is no abdominal tenderness.  Musculoskeletal:        General:  No tenderness.     Cervical back: Normal range of motion and neck supple.  Skin:  General: Skin is warm and dry.     Findings: No erythema or rash.  Neurological:     Mental Status: He is alert and oriented to person, place, and time.    ED Results and Treatments Labs (all labs ordered are listed, but only abnormal results are displayed) Labs Reviewed  CBC WITH DIFFERENTIAL/PLATELET - Abnormal; Notable for the following components:      Result Value   WBC 17.9 (*)    RBC 2.87 (*)    Hemoglobin 9.1 (*)    HCT 24.9 (*)    MCHC 36.5 (*)    RDW 26.4 (*)    nRBC 1.5 (*)    Neutro Abs 13.4 (*)    Monocytes Absolute 2.8 (*)    Abs Immature Granulocytes 0.13 (*)    All other components within normal limits  COMPREHENSIVE METABOLIC PANEL - Abnormal; Notable for the following components:   Glucose, Bld 154 (*)    Creatinine, Ser 0.60 (*)    Calcium 8.8 (*)    Total Protein 8.2 (*)    AST 64 (*)    Total Bilirubin 6.7 (*)    All other components within normal limits  RETICULOCYTES - Abnormal; Notable for the following components:   Retic Ct Pct 12.6 (*)    RBC. 2.84 (*)    Retic Count, Absolute 353.0 (*)    Immature Retic Fract 34.7 (*)    All other components within normal limits  BLOOD GAS, VENOUS - Abnormal; Notable for the following components:   pCO2, Ven 39.1 (*)    pO2, Ven 83.6 (*)    All other components within normal limits  BILIRUBIN, DIRECT - Abnormal; Notable for the following components:   Bilirubin, Direct 1.2 (*)    All other components within normal limits  LACTATE DEHYDROGENASE - Abnormal; Notable for the following components:   LDH 448 (*)    All other components within normal limits  RESP PANEL BY RT-PCR (FLU A&B, COVID) ARPGX2  LACTIC ACID, PLASMA  URINALYSIS, ROUTINE W REFLEX MICROSCOPIC  HAPTOGLOBIN  TROPONIN I (HIGH SENSITIVITY)  TROPONIN I (HIGH SENSITIVITY)                                                                                                                          EKG  EKG Interpretation  Date/Time:  Wednesday May 30 2021 02:23:10 EST Ventricular Rate:  135 PR Interval:  132 QRS Duration: 86 QT Interval:  311 QTC Calculation: 467 R Axis:   66 Text Interpretation: Sinus tachycardia RSR' in V1 or V2, probably normal variant Nonspecific T abnormalities, diffuse leads Baseline wander in lead(s) V1 Confirmed by Drema Pry [phone]) on 05/30/2021 2:44:16 AM       Radiology CT Angio Chest PE W and/or Wo Contrast  Result Date: 05/30/2021 CLINICAL DATA:  40 year old male with sickle cell.  Chest pain. EXAM: CT ANGIOGRAPHY CHEST WITH CONTRAST TECHNIQUE: Multidetector CT imaging of the chest was performed using the standard protocol  during bolus administration of intravenous contrast. Multiplanar CT image reconstructions and MIPs were obtained to evaluate the vascular anatomy. CONTRAST:  53mL OMNIPAQUE IOHEXOL 350 MG/ML SOLN COMPARISON:  CTA chest 05/26/2021. FINDINGS: Cardiovascular: Suboptimal contrast bolus timing in the pulmonary arterial tree. Respiratory motion. No central or hilar pulmonary artery filling defect. But the segmental and distal branches are not evaluated owing to motion and lack of contrast. Mild cardiomegaly. No pericardial effusion. Negative visible aorta. Mediastinum/Nodes: Negative, no mediastinal lymphadenopathy. Lungs/Pleura: Low lung volumes but mildly improved compared to earlier this month. Major airways remain patent. Perihilar and dependent crowding of lung markings, but more indeterminate, confluent peribronchial ground-glass opacity now in the left upper lobe (series 6, image 44) and in both lower lobes. No consolidation. No pleural effusion. Upper Abdomen: Partially calcified atrophic spleen again noted. Negative visible liver, kidneys, adrenal glands, and bowel in the upper abdomen. Musculoskeletal: Sickle cell related abnormal bone mineralization. No acute or suspicious osseous lesion  identified. Review of the MIP images confirms the above findings. IMPRESSION: 1. Suboptimal contrast bolus timing and respiratory motion. No central or hilar pulmonary embolus, but the segmental and distal branches are not evaluated. 2. Similar low lung volumes with some atelectasis. But peribronchial ground-glass opacity in the left upper and both lower lobes now is more indeterminate for atelectasis versus inflammation/infection. No pleural effusion. 3. Sequelae of Sickle Cell Disease.  Mild cardiomegaly. Electronically Signed   By: Odessa Fleming M.D.   On: 05/30/2021 06:19   DG Chest Portable 1 View  Result Date: 05/30/2021 CLINICAL DATA:  Chest pain. EXAM: PORTABLE CHEST 1 VIEW COMPARISON:  05/25/2021. FINDINGS: The heart size and mediastinal contours are within normal limits. Mild atelectasis at the lung bases. No effusion or pneumothorax. The visualized skeletal structures are unremarkable. IMPRESSION: Mild atelectasis at the lung bases. Electronically Signed   By: Thornell Sartorius M.D.   On: 05/30/2021 02:55    Pertinent labs & imaging results that were available during my care of the patient were reviewed by me and considered in my medical decision making (see MDM for details).  Medications Ordered in ED Medications  dextrose 5 %-0.45 % sodium chloride infusion (0 mLs Intravenous Paused 05/30/21 0728)  ondansetron (ZOFRAN) injection 4 mg (4 mg Intravenous Given 05/30/21 0343)  sodium chloride (PF) 0.9 % injection (has no administration in time range)  cefTRIAXone (ROCEPHIN) 1 g in sodium chloride 0.9 % 100 mL IVPB (0 g Intravenous Paused 05/30/21 0727)  ketorolac (TORADOL) 15 MG/ML injection 15 mg (15 mg Intravenous Given 05/30/21 0342)  HYDROmorphone (DILAUDID) injection 2 mg (2 mg Intravenous Given 05/30/21 0341)  HYDROmorphone (DILAUDID) injection 2 mg (2 mg Intravenous Given 05/30/21 0429)  diphenhydrAMINE (BENADRYL) injection 25 mg (25 mg Intravenous Given 05/30/21 0343)  iohexol (OMNIPAQUE)  350 MG/ML injection 80 mL (80 mLs Intravenous Contrast Given 05/30/21 0541)  Procedures Procedures  (including critical care time)  Medical Decision Making / ED Course I have reviewed the nursing notes for this encounter and the patient's prior records (if available in EHR or on provided paperwork).  Michael Mullins was evaluated in Emergency Department on 05/30/2021 for the symptoms described in the history of present illness. He was evaluated in the context of the global COVID-19 pandemic, which necessitated consideration that the patient might be at risk for infection with the SARS-CoV-2 virus that causes COVID-19. Institutional protocols and algorithms that pertain to the evaluation of patients at risk for COVID-19 are in a state of rapid change based on information released by regulatory bodies including the CDC and federal and state organizations. These policies and algorithms were followed during the patient's care in the ED.     Patient here with 1 day of gradually worsening generalized fatigue. Noted to be hypoxic on room air requiring 2 L supplemental oxygen. History of sickle cell with several hours of chest pain.  Currently being treated for pneumonia. Noted to be tachycardic.  Patient has a history of prior PEs. Had a CT scan during recent admission without PE. Given his hypoxia and tachycardia, will repeat CT.  Labs notable for worsening leukocytosis.  Hemoglobin 1 g down from recent admission.  Bilirubin higher than recent admission. Will need to assess for hemolysis.  Haptoglobin, LDH and direct bili ordered.  Patient started on IV fluids and given pain medicine. Pain significantly improved. Tachycardia improved.  CT notable for with possible pneumonia. No large PE noted. Rocephin given.  Will admit for further work up and  management.  Pertinent labs & imaging results that were available during my care of the patient were reviewed by me and considered in my medical decision making:    Final Clinical Impression(s) / ED Diagnoses Final diagnoses:  Sickle cell pain crisis (HCC)  Multifocal pneumonia  Hypoxia     This chart was dictated using voice recognition software.  Despite best efforts to proofread,  errors can occur which can change the documentation meaning.    Nira Conn, MD 05/30/21 [phone]

## 2021-05-31 DIAGNOSIS — G894 Chronic pain syndrome: Secondary | ICD-10-CM | POA: Diagnosis present

## 2021-05-31 DIAGNOSIS — Z20822 Contact with and (suspected) exposure to covid-19: Secondary | ICD-10-CM | POA: Diagnosis present

## 2021-05-31 DIAGNOSIS — Z79899 Other long term (current) drug therapy: Secondary | ICD-10-CM | POA: Diagnosis not present

## 2021-05-31 DIAGNOSIS — E1165 Type 2 diabetes mellitus with hyperglycemia: Secondary | ICD-10-CM | POA: Diagnosis present

## 2021-05-31 DIAGNOSIS — D5700 Hb-SS disease with crisis, unspecified: Secondary | ICD-10-CM | POA: Diagnosis present

## 2021-05-31 DIAGNOSIS — J189 Pneumonia, unspecified organism: Secondary | ICD-10-CM | POA: Diagnosis present

## 2021-05-31 DIAGNOSIS — Y95 Nosocomial condition: Secondary | ICD-10-CM | POA: Diagnosis present

## 2021-05-31 DIAGNOSIS — D72828 Other elevated white blood cell count: Secondary | ICD-10-CM | POA: Diagnosis present

## 2021-05-31 DIAGNOSIS — Z7984 Long term (current) use of oral hypoglycemic drugs: Secondary | ICD-10-CM | POA: Diagnosis not present

## 2021-05-31 DIAGNOSIS — J9601 Acute respiratory failure with hypoxia: Secondary | ICD-10-CM | POA: Diagnosis present

## 2021-05-31 DIAGNOSIS — D638 Anemia in other chronic diseases classified elsewhere: Secondary | ICD-10-CM | POA: Diagnosis present

## 2021-05-31 LAB — CBC WITH DIFFERENTIAL/PLATELET
Abs Immature Granulocytes: 0.06 10*3/uL (ref 0.00–0.07)
Basophils Absolute: 0 10*3/uL (ref 0.0–0.1)
Basophils Relative: 0 %
Eosinophils Absolute: 0.3 10*3/uL (ref 0.0–0.5)
Eosinophils Relative: 3 %
HCT: 21.2 % — ABNORMAL LOW (ref 39.0–52.0)
Hemoglobin: 7.9 g/dL — ABNORMAL LOW (ref 13.0–17.0)
Immature Granulocytes: 1 %
Lymphocytes Relative: 37 %
Lymphs Abs: 4.2 10*3/uL — ABNORMAL HIGH (ref 0.7–4.0)
MCH: 32.1 pg (ref 26.0–34.0)
MCHC: 37.3 g/dL — ABNORMAL HIGH (ref 30.0–36.0)
MCV: 86.2 fL (ref 80.0–100.0)
Monocytes Absolute: 1.6 10*3/uL — ABNORMAL HIGH (ref 0.1–1.0)
Monocytes Relative: 14 %
Neutro Abs: 5.1 10*3/uL (ref 1.7–7.7)
Neutrophils Relative %: 45 %
Platelets: 206 10*3/uL (ref 150–400)
RBC: 2.46 MIL/uL — ABNORMAL LOW (ref 4.22–5.81)
RDW: 25.6 % — ABNORMAL HIGH (ref 11.5–15.5)
WBC: 11.3 10*3/uL — ABNORMAL HIGH (ref 4.0–10.5)
nRBC: 0.4 % — ABNORMAL HIGH (ref 0.0–0.2)

## 2021-05-31 LAB — CULTURE, BLOOD (ROUTINE X 2)
Culture: NO GROWTH
Culture: NO GROWTH
Special Requests: ADEQUATE
Special Requests: ADEQUATE

## 2021-05-31 LAB — GLUCOSE, CAPILLARY
Glucose-Capillary: 80 mg/dL (ref 70–99)
Glucose-Capillary: 69 mg/dL — ABNORMAL LOW (ref 70–99)
Glucose-Capillary: 79 mg/dL (ref 70–99)
Glucose-Capillary: 89 mg/dL (ref 70–99)
Glucose-Capillary: 123 mg/dL — ABNORMAL HIGH (ref 70–99)

## 2021-05-31 LAB — COMPREHENSIVE METABOLIC PANEL
ALT: 34 U/L (ref 0–44)
AST: 49 U/L — ABNORMAL HIGH (ref 15–41)
Albumin: 3.6 g/dL (ref 3.5–5.0)
Alkaline Phosphatase: 72 U/L (ref 38–126)
Anion gap: 3 — ABNORMAL LOW (ref 5–15)
BUN: 11 mg/dL (ref 6–20)
CO2: 25 mmol/L (ref 22–32)
Calcium: 8.4 mg/dL — ABNORMAL LOW (ref 8.9–10.3)
Chloride: 107 mmol/L (ref 98–111)
Creatinine, Ser: 0.7 mg/dL (ref 0.61–1.24)
GFR, Estimated: >60 mL/min (ref >60)
Glucose, Bld: 127 mg/dL — ABNORMAL HIGH (ref 70–99)
Potassium: 4.1 mmol/L (ref 3.5–5.1)
Sodium: 135 mmol/L (ref 135–145)
Total Bilirubin: 3.5 mg/dL — ABNORMAL HIGH (ref 0.3–1.2)
Total Protein: 7.2 g/dL (ref 6.5–8.1)

## 2021-05-31 LAB — HEMOGLOBIN A1C
Hgb A1c MFr Bld: <4.2 % — ABNORMAL LOW (ref 4.8–5.6)
Mean Plasma Glucose: <74 mg/dL

## 2021-05-31 LAB — HAPTOGLOBIN: Haptoglobin: <10 mg/dL — ABNORMAL LOW (ref 17–317)

## 2021-05-31 NOTE — Progress Notes (Signed)
Subjective: A 40 year old gentleman admitted with healthcare associated pneumonia and sickle cell crisis.  Patient still having significant pain.  He is breathing better but still having tight chest discomfort.  Objective: Vital signs in last 24 hours: Temp:  [97.7 F (36.5 C)-98.4 F (36.9 C)] 98.1 F (36.7 C) (12/22 0917) Pulse Rate:  [56-80] 61 (12/22 0917) Resp:  [11-23] 18 (12/22 0917) BP: (101-121)/(46-66) 112/60 (12/22 0917) SpO2:  [20 %-99 %] 95 % (12/22 0917) Weight:  [99.7 kg] 99.7 kg (12/21 2120) Weight change:  Last BM Date: 05/30/21  Intake/Output from previous day: 12/21 0701 - 12/22 0700 In: 3339.5 [P.O.:180; I.V.:2793.3; IV Piggyback:366.2] Out: 700 [Urine:700] Intake/Output this shift: No intake/output data recorded.  General appearance: alert, cooperative, appears stated age, and no distress Resp: clear to auscultation bilaterally Chest wall: no tenderness Cardio: regular rate and rhythm, S1, S2 normal, no murmur, click, rub or gallop GI: soft, non-tender; bowel sounds normal; no masses,  no organomegaly Extremities: extremities normal, atraumatic, no cyanosis or edema Pulses: 2+ and symmetric Neurologic: Grossly normal  Lab Results: Recent Labs    05/30/21 1200 05/31/21 0336  WBC 15.0* 11.3*  HGB 7.7* 7.9*  HCT 21.3* 21.2*  PLT 242 206   BMET Recent Labs    05/30/21 0317 05/30/21 1200 05/31/21 0336  NA 135  --  135  K 3.8  --  4.1  CL 103  --  107  CO2 24  --  25  GLUCOSE 154*  --  127*  BUN 9  --  11  CREATININE 0.60* 0.66 0.70  CALCIUM 8.8*  --  8.4*    Studies/Results: CT Angio Chest PE W and/or Wo Contrast  Result Date: 05/30/2021 CLINICAL DATA:  40 year old male with sickle cell.  Chest pain. EXAM: CT ANGIOGRAPHY CHEST WITH CONTRAST TECHNIQUE: Multidetector CT imaging of the chest was performed using the standard protocol during bolus administration of intravenous contrast. Multiplanar CT image reconstructions and MIPs were  obtained to evaluate the vascular anatomy. CONTRAST:  13mL OMNIPAQUE IOHEXOL 350 MG/ML SOLN COMPARISON:  CTA chest 05/26/2021. FINDINGS: Cardiovascular: Suboptimal contrast bolus timing in the pulmonary arterial tree. Respiratory motion. No central or hilar pulmonary artery filling defect. But the segmental and distal branches are not evaluated owing to motion and lack of contrast. Mild cardiomegaly. No pericardial effusion. Negative visible aorta. Mediastinum/Nodes: Negative, no mediastinal lymphadenopathy. Lungs/Pleura: Low lung volumes but mildly improved compared to earlier this month. Major airways remain patent. Perihilar and dependent crowding of lung markings, but more indeterminate, confluent peribronchial ground-glass opacity now in the left upper lobe (series 6, image 44) and in both lower lobes. No consolidation. No pleural effusion. Upper Abdomen: Partially calcified atrophic spleen again noted. Negative visible liver, kidneys, adrenal glands, and bowel in the upper abdomen. Musculoskeletal: Sickle cell related abnormal bone mineralization. No acute or suspicious osseous lesion identified. Review of the MIP images confirms the above findings. IMPRESSION: 1. Suboptimal contrast bolus timing and respiratory motion. No central or hilar pulmonary embolus, but the segmental and distal branches are not evaluated. 2. Similar low lung volumes with some atelectasis. But peribronchial ground-glass opacity in the left upper and both lower lobes now is more indeterminate for atelectasis versus inflammation/infection. No pleural effusion. 3. Sequelae of Sickle Cell Disease.  Mild cardiomegaly. Electronically Signed   By: Odessa Fleming M.D.   On: 05/30/2021 06:19   DG Chest Portable 1 View  Result Date: 05/30/2021 CLINICAL DATA:  Chest pain. EXAM: PORTABLE CHEST 1 VIEW COMPARISON:  05/25/2021. FINDINGS: The heart size and mediastinal contours are within normal limits. Mild atelectasis at the lung bases. No effusion or  pneumothorax. The visualized skeletal structures are unremarkable. IMPRESSION: Mild atelectasis at the lung bases. Electronically Signed   By: Thornell Sartorius M.D.   On: 05/30/2021 02:55    Medications: I have reviewed the patient's current medications.  Assessment/Plan: A 40 year old gentleman admitted with healthcare associated pneumonia.  #1 Healthcare associated pneumonia: Patient will be maintained on current antibiotic regimen.  Continue with IV antibiotics, supportive care.  Cultures still negative.  #2 sickle cell pain crisis: Patient is on Dilaudid PCA.  Pain is mainly in the chest.  Also IV fluid and other supportive care.  We will continue to monitor.  #3 anemia of chronic disease: Secondary to sickle cell crisis.  Patient doing better otherwise.  We will monitor H&H.  #4 leukocytosis: Secondary to pneumonia.  White count is improving.  LOS: 0 days   Velena Keegan,LAWAL 05/31/2021, 9:24 AM

## 2021-05-31 NOTE — Progress Notes (Signed)
Transition of Care [facility]) Screening Note  Patient Details  Name: [name] Date of Birth: 1980-08-04  Transition of Care [facility]) CM/SW Contact:    Ewing Schlein, LCSW Phone Number: 05/31/2021, 11:26 AM  Transition of Care Department [facility]) has reviewed patient and no TOC needs have been identified at this time. We will continue to monitor patient advancement through interdisciplinary progression rounds. If new patient transition needs arise, please place a TOC consult.

## 2021-06-01 ENCOUNTER — Other Ambulatory Visit (HOSPITAL_BASED_OUTPATIENT_CLINIC_OR_DEPARTMENT_OTHER): Payer: Self-pay

## 2021-06-01 DIAGNOSIS — J189 Pneumonia, unspecified organism: Secondary | ICD-10-CM | POA: Diagnosis not present

## 2021-06-01 LAB — CBC WITH DIFFERENTIAL/PLATELET
Abs Immature Granulocytes: 0.16 10*3/uL — ABNORMAL HIGH (ref 0.00–0.07)
Basophils Absolute: 0.1 10*3/uL (ref 0.0–0.1)
Basophils Relative: 0 %
Eosinophils Absolute: 0.4 10*3/uL (ref 0.0–0.5)
Eosinophils Relative: 3 %
HCT: 21 % — ABNORMAL LOW (ref 39.0–52.0)
Hemoglobin: 7.7 g/dL — ABNORMAL LOW (ref 13.0–17.0)
Immature Granulocytes: 1 %
Lymphocytes Relative: 33 %
Lymphs Abs: 4.2 10*3/uL — ABNORMAL HIGH (ref 0.7–4.0)
MCH: 32.6 pg (ref 26.0–34.0)
MCHC: 36.7 g/dL — ABNORMAL HIGH (ref 30.0–36.0)
MCV: 89 fL (ref 80.0–100.0)
Monocytes Absolute: 2.2 10*3/uL — ABNORMAL HIGH (ref 0.1–1.0)
Monocytes Relative: 17 %
Neutro Abs: 5.8 10*3/uL (ref 1.7–7.7)
Neutrophils Relative %: 46 %
Platelets: 234 10*3/uL (ref 150–400)
RBC: 2.36 MIL/uL — ABNORMAL LOW (ref 4.22–5.81)
RDW: 27.5 % — ABNORMAL HIGH (ref 11.5–15.5)
WBC: 12.7 10*3/uL — ABNORMAL HIGH (ref 4.0–10.5)
nRBC: 3.2 % — ABNORMAL HIGH (ref 0.0–0.2)

## 2021-06-01 LAB — GLUCOSE, CAPILLARY
Glucose-Capillary: 96 mg/dL (ref 70–99)
Glucose-Capillary: 66 mg/dL — ABNORMAL LOW (ref 70–99)
Glucose-Capillary: 69 mg/dL — ABNORMAL LOW (ref 70–99)
Glucose-Capillary: 73 mg/dL (ref 70–99)
Glucose-Capillary: 85 mg/dL (ref 70–99)
Glucose-Capillary: 112 mg/dL — ABNORMAL HIGH (ref 70–99)

## 2021-06-01 LAB — COMPREHENSIVE METABOLIC PANEL
ALT: 27 U/L (ref 0–44)
AST: 34 U/L (ref 15–41)
Albumin: 3.4 g/dL — ABNORMAL LOW (ref 3.5–5.0)
Alkaline Phosphatase: 73 U/L (ref 38–126)
Anion gap: 5 (ref 5–15)
BUN: 9 mg/dL (ref 6–20)
CO2: 24 mmol/L (ref 22–32)
Calcium: 8.6 mg/dL — ABNORMAL LOW (ref 8.9–10.3)
Chloride: 109 mmol/L (ref 98–111)
Creatinine, Ser: 0.53 mg/dL — ABNORMAL LOW (ref 0.61–1.24)
GFR, Estimated: >60 mL/min (ref >60)
Glucose, Bld: 104 mg/dL — ABNORMAL HIGH (ref 70–99)
Potassium: 3.9 mmol/L (ref 3.5–5.1)
Sodium: 138 mmol/L (ref 135–145)
Total Bilirubin: 3.4 mg/dL — ABNORMAL HIGH (ref 0.3–1.2)
Total Protein: 7 g/dL (ref 6.5–8.1)

## 2021-06-01 NOTE — Progress Notes (Signed)
Subjective: Patient still complains of chest tightness.  Pain is down to 6 out of 10.  He was just discharged prior to coming back.  He is still requiring some oxygen but for the most part appears to be improving.  Chest wall pain is mainly pleuritic.  His white count has gone of overnight from 11.3-12.7.  Hemoglobin is staying stable.  Objective: Vital signs in last 24 hours: Temp:  [97.6 F (36.4 C)-98.2 F (36.8 C)] 98.2 F (36.8 C) (12/23 1349) Pulse Rate:  [61-79] 66 (12/23 1349) Resp:  [13-18] 16 (12/23 1349) BP: (106-123)/(51-66) 108/60 (12/23 1349) SpO2:  [91 %-98 %] 94 % (12/23 1349) Weight change:  Last BM Date: 05/30/21  Intake/Output from previous day: 12/22 0701 - 12/23 0700 In: 3304.8 [P.O.:600; I.V.:2478.5; IV Piggyback:226.3] Out: 2700 [Urine:2700] Intake/Output this shift: Total I/O In: 534.9 [I.V.:434.9; IV Piggyback:100] Out: -   General appearance: alert, cooperative, appears stated age, and no distress Resp: clear to auscultation bilaterally Chest wall: no tenderness Cardio: regular rate and rhythm, S1, S2 normal, no murmur, click, rub or gallop GI: soft, non-tender; bowel sounds normal; no masses,  no organomegaly Extremities: extremities normal, atraumatic, no cyanosis or edema Pulses: 2+ and symmetric Neurologic: Grossly normal  Lab Results: Recent Labs    05/31/21 0336 06/01/21 0729  WBC 11.3* 12.7*  HGB 7.9* 7.7*  HCT 21.2* 21.0*  PLT 206 234    BMET Recent Labs    05/31/21 0336 06/01/21 0729  NA 135 138  K 4.1 3.9  CL 107 109  CO2 25 24  GLUCOSE 127* 104*  BUN 11 9  CREATININE 0.70 0.53*  CALCIUM 8.4* 8.6*     Studies/Results: No results found.  Medications: I have reviewed the patient's current medications.  Assessment/Plan: A 40 year old gentleman admitted with healthcare associated pneumonia.  #1 Healthcare associated pneumonia: Patient appears to be improving in general.  Cultures still negative.  Patient will be  maintained on current antibiotic regimen.  Continue with IV antibiotics, supportive care.  Cultures still negative.  #2 sickle cell pain crisis: Patient is on Dilaudid PCA.  Pain is mainly in the chest.  Also IV fluid and other supportive care.  We will continue to monitor.  #3 anemia of chronic disease: Secondary to sickle cell crisis.  Patient doing better otherwise.  We will monitor H&H.  #4 leukocytosis: Secondary to pneumonia.  White count is slightly worse today but within range.  Overall clinically improving.  LOS: 1 day   Cyndee Giammarco,LAWAL 06/01/2021, 3:00 PM

## 2021-06-01 NOTE — Progress Notes (Signed)
Pt CBG 66-73 at noon check.  Pt with episode of N/V - IV Zofran administered.  MD notified.  Will continue to follow.

## 2021-06-01 NOTE — Plan of Care (Signed)
  Problem: Education: Goal: Knowledge of General Education information will improve Description: Including pain rating scale, medication(s)/side effects and non-pharmacologic comfort measures Outcome: Progressing   Problem: Coping: Goal: Level of anxiety will decrease Outcome: Progressing   Problem: Pain Managment: Goal: General experience of comfort will improve Outcome: Progressing   

## 2021-06-02 DIAGNOSIS — J189 Pneumonia, unspecified organism: Secondary | ICD-10-CM

## 2021-06-02 LAB — CBC
HCT: 23 % — ABNORMAL LOW (ref 39.0–52.0)
Hemoglobin: 8.3 g/dL — ABNORMAL LOW (ref 13.0–17.0)
MCH: 32.5 pg (ref 26.0–34.0)
MCHC: 36.1 g/dL — ABNORMAL HIGH (ref 30.0–36.0)
MCV: 90.2 fL (ref 80.0–100.0)
Platelets: 275 10*3/uL (ref 150–400)
RBC: 2.55 MIL/uL — ABNORMAL LOW (ref 4.22–5.81)
RDW: 29.4 % — ABNORMAL HIGH (ref 11.5–15.5)
WBC: 13.9 10*3/uL — ABNORMAL HIGH (ref 4.0–10.5)
nRBC: 1.9 % — ABNORMAL HIGH (ref 0.0–0.2)

## 2021-06-02 LAB — GLUCOSE, CAPILLARY
Glucose-Capillary: 82 mg/dL (ref 70–99)
Glucose-Capillary: 116 mg/dL — ABNORMAL HIGH (ref 70–99)
Glucose-Capillary: 71 mg/dL (ref 70–99)
Glucose-Capillary: 99 mg/dL (ref 70–99)

## 2021-06-02 MED ORDER — HYDROMORPHONE HCL 1 MG/ML IJ SOLN: 1.0000 mg | INTRAMUSCULAR | Status: DC | PRN

## 2021-06-02 MED ORDER — AMOXICILLIN-POT CLAVULANATE 875-125 MG PO TABS
1.0000 | ORAL_TABLET | Freq: Two times a day (BID) | ORAL | Status: DC
  Administered 2021-06-02 – 2021-06-03 (×3): 1 via ORAL
  Filled 2021-06-02 (×3): qty 1

## 2021-06-02 MED ORDER — ACETAMINOPHEN 325 MG PO TABS
650.0000 mg | ORAL_TABLET | ORAL | Status: DC | PRN
  Administered 2021-06-02: 17:00:00 650 mg via ORAL
  Filled 2021-06-02: qty 2

## 2021-06-02 MED ORDER — OXYCODONE HCL 5 MG PO TABS
30.0000 mg | ORAL_TABLET | Freq: Four times a day (QID) | ORAL | Status: DC | PRN
  Administered 2021-06-02 – 2021-06-03 (×2): 30 mg via ORAL
  Filled 2021-06-02 (×2): qty 6

## 2021-06-02 MED ORDER — OXYCODONE HCL 5 MG PO TABS: 10.0000 mg | ORAL_TABLET | ORAL | Status: DC | PRN

## 2021-06-02 NOTE — Progress Notes (Signed)
SATURATION QUALIFICATIONS: (This note is used to comply with regulatory documentation for home oxygen)  Patient Saturations on Room Air at Rest = 92% %  Patient Saturations on Room Air while Ambulating = 74%  Patient Saturations on 1 Liter of oxygen while Ambulating = 95%  Please briefly explain why patient needs home oxygen:  Patient complains of dizziness when walking in the hall without 02 on. Patient being treated for Pneumonia. 02 sat drops in the hallway when on room air.

## 2021-06-02 NOTE — Progress Notes (Signed)
Pharmacy Antibiotic Note  Michael Mullins is a 40 y.o. male admitted on 05/30/2021 with pneumonia.  Pharmacy has been consulted for cefepime dosing.  He had worsening dypsnea and fatigue after recent PNA treatment.  Day #4 Cefepime.  WBC elevated at 12.7, remains afebrile.    Plan: Cefepime 2g IV Q8h Monitor clinical picture, renal function   Recent Labs  Lab 05/28/21 1354 05/30/21 0317 05/30/21 1200 05/31/21 0336 06/01/21 0729  WBC 12.7* 17.9* 15.0* 11.3* 12.7*  CREATININE 0.49* 0.60* 0.66 0.70 0.53*  LATICACIDVEN  --  1.0  --   --   --      Estimated Creatinine Clearance: 149.2 mL/min (A) (by C-G formula based on SCr of 0.53 mg/dL (L)).    Allergies  Allergen Reactions   Morphine Hives    Tolerates hydromorphone per MAR   Vancomycin Itching    Tolerates with longer infusion times and premedcation with IV bendryl    Antimicrobials this admission: Cefepime 12/21 >>   Dose adjustments this admission:  Microbiology results: 12/21: Flu and COVID negative  Thank you for allowing pharmacy to be a part of this patients care.  Lynann Beaver PharmD, BCPS Clinical Pharmacist [facility] main pharmacy [phone] 06/02/2021 1:11 PM

## 2021-06-02 NOTE — Plan of Care (Signed)
  Problem: Education: Goal: Knowledge of General Education information will improve Description Including pain rating scale, medication(s)/side effects and non-pharmacologic comfort measures Outcome: Progressing   

## 2021-06-02 NOTE — Plan of Care (Signed)

## 2021-06-02 NOTE — Progress Notes (Addendum)
Subjective: Michael Mullins is a 40 year old male with a medical history significant for sickle cell disease, type 2 diabetes mellitus, opiate dependence and tolerance, and history of anemia of chronic disease was admitted for hospital associated pneumonia in the setting of sickle cell pain crisis. Patient states that his pain has improved overnight.  Pain is primarily to low back and central chest. Patient endorses fatigue.  He states that he feels very tired.  He has been ambulating around the room. He denies any shortness of breath, headache, chest pain, urinary symptoms, nausea, vomiting, or diarrhea.  Objective:  Vital signs in last 24 hours:  Vitals:   06/02/21 0808 06/02/21 0955 06/02/21 1241 06/02/21 1345  BP:  126/65  (!) 124/53  Pulse:  63  (!) 58  Resp: 13 16 14 16   Temp:  98.1 F (36.7 C)  98 F (36.7 C)  TempSrc:  Oral    SpO2: 94% 97% 98% 97%  Weight:      Height:        Intake/Output from previous day:   Intake/Output Summary (Last 24 hours) at 06/02/2021 1459 Last data filed at 06/02/2021 1000 Gross per 24 hour  Intake 3004.48 ml  Output 1950 ml  Net 1054.48 ml    Physical Exam: General: Alert, awake, oriented x3, in no acute distress.  HEENT: [place]/AT PEERL, EOMI Neck: Trachea midline,  no masses, no thyromegal,y no JVD, no carotid bruit OROPHARYNX:  Moist, No exudate/ erythema/lesions.  Heart: Regular rate and rhythm, without murmurs, rubs, gallops, PMI non-displaced, no heaves or thrills on palpation.  Lungs: Clear to auscultation, no wheezing or rhonchi noted. No increased vocal fremitus resonant to percussion  Abdomen: Soft, nontender, nondistended, positive bowel sounds, no masses no hepatosplenomegaly noted..  Neuro: No focal neurological deficits noted cranial nerves II through XII grossly intact. DTRs 2+ bilaterally upper and lower extremities. Strength 5 out of 5 in bilateral upper and lower extremities. Musculoskeletal: No warm swelling or erythema  around joints, no spinal tenderness noted. Psychiatric: Patient alert and oriented x3, good insight and cognition, good recent to remote recall. Lymph node survey: No cervical axillary or inguinal lymphadenopathy noted.  Lab Results:  Basic Metabolic Panel:    Component Value Date/Time   NA 138 06/01/2021 0729   K 3.9 06/01/2021 0729   CL 109 06/01/2021 0729   CO2 24 06/01/2021 0729   BUN 9 06/01/2021 0729   CREATININE 0.53 (L) 06/01/2021 0729   GLUCOSE 104 (H) 06/01/2021 0729   CALCIUM 8.6 (L) 06/01/2021 0729   CBC:    Component Value Date/Time   WBC 13.9 (H) 06/02/2021 1310   HGB 8.3 (L) 06/02/2021 1310   HCT 23.0 (L) 06/02/2021 1310   PLT 275 06/02/2021 1310   MCV 90.2 06/02/2021 1310   NEUTROABS 5.8 06/01/2021 0729   LYMPHSABS 4.2 (H) 06/01/2021 0729   MONOABS 2.2 (H) 06/01/2021 0729   EOSABS 0.4 06/01/2021 0729   BASOSABS 0.1 06/01/2021 0729    Recent Results (from the past 240 hour(s))  Resp Panel by RT-PCR (Flu A&B, Covid) Nasopharyngeal Swab     Status: None   Collection Time: 05/25/21  9:04 PM   Specimen: Nasopharyngeal Swab; Nasopharyngeal(NP) swabs in vial transport medium  Result Value Ref Range Status   SARS Coronavirus 2 by RT PCR NEGATIVE NEGATIVE Final    Comment: (NOTE) SARS-CoV-2 target nucleic acids are NOT DETECTED.  The SARS-CoV-2 RNA is generally detectable in upper respiratory specimens during the acute phase of infection. The lowest  concentration of SARS-CoV-2 viral copies this assay can detect is 138 copies/mL. A negative result does not preclude SARS-Cov-2 infection and should not be used as the sole basis for treatment or other patient management decisions. A negative result may occur with  improper specimen collection/handling, submission of specimen other than nasopharyngeal swab, presence of viral mutation(s) within the areas targeted by this assay, and inadequate number of viral copies(<138 copies/mL). A negative result must be  combined with clinical observations, patient history, and epidemiological information. The expected result is Negative.  Fact Sheet for Patients:  BloggerCourse.com  Fact Sheet for Healthcare Providers:  SeriousBroker.it  This test is no t yet approved or cleared by the [place] FDA and  has been authorized for detection and/or diagnosis of SARS-CoV-2 by FDA under an Emergency Use Authorization (EUA). This EUA will remain  in effect (meaning this test can be used) for the duration of the COVID-19 declaration under Section 564(b)(1) of the Act, 21 U.S.C.section 360bbb-3(b)(1), unless the authorization is terminated  or revoked sooner.       Influenza A by PCR NEGATIVE NEGATIVE Final   Influenza B by PCR NEGATIVE NEGATIVE Final    Comment: (NOTE) The Xpert Xpress SARS-CoV-2/FLU/RSV plus assay is intended as an aid in the diagnosis of influenza from Nasopharyngeal swab specimens and should not be used as a sole basis for treatment. Nasal washings and aspirates are unacceptable for Xpert Xpress SARS-CoV-2/FLU/RSV testing.  Fact Sheet for Patients: BloggerCourse.com  Fact Sheet for Healthcare Providers: SeriousBroker.it  This test is not yet approved or cleared by the [place] FDA and has been authorized for detection and/or diagnosis of SARS-CoV-2 by FDA under an Emergency Use Authorization (EUA). This EUA will remain in effect (meaning this test can be used) for the duration of the COVID-19 declaration under Section 564(b)(1) of the Act, 21 U.S.C. section 360bbb-3(b)(1), unless the authorization is terminated or revoked.  Performed at [facility], [address] [place], [place] [address]   Culture, blood (Routine X 2) w Reflex to ID Panel     Status: None   Collection Time: 05/26/21  5:30 AM   Specimen: BLOOD  Result Value Ref Range Status    Specimen Description   Final    BLOOD BLOOD RIGHT HAND Performed at [facility], [address], [place], [place] [address]    Special Requests   Final    BOTTLES DRAWN AEROBIC ONLY Blood Culture adequate volume Performed at [facility], [address] [place], [place] [address]    Culture   Final    NO GROWTH 5 DAYS Performed at [facility], [address], [place], [place] [address]    Report Status 05/31/2021 FINAL  Final  Culture, blood (Routine X 2) w Reflex to ID Panel     Status: None   Collection Time: 05/26/21  5:30 AM   Specimen: BLOOD  Result Value Ref Range Status   Specimen Description   Final    BLOOD LEFT ANTECUBITAL Performed at [facility], [address], [place], [place] [address]    Special Requests   Final    BOTTLES DRAWN AEROBIC ONLY Blood Culture adequate volume Performed at [facility], [address] [place], [place] [address]    Culture   Final    NO GROWTH 5 DAYS Performed at [facility], [address], [place], [place] [address]    Report Status 05/31/2021  FINAL  Final  MRSA Next Gen by PCR, Nasal     Status: None   Collection Time: 05/27/21  9:12 AM   Specimen: Nasal Mucosa; Nasal Swab  Result Value Ref Range Status   MRSA by PCR Next Gen NOT DETECTED NOT DETECTED Final    Comment: (NOTE) The GeneXpert MRSA Assay (FDA approved for NASAL specimens only), is one component of a comprehensive MRSA colonization surveillance program. It is not intended to diagnose MRSA infection nor to guide or monitor treatment for MRSA infections. Test performance is not FDA approved in patients less than 87 years old. Performed at [facility], [address] [place], [place] [address]   Resp Panel by RT-PCR (Flu A&B, Covid) Nasopharyngeal Swab     Status: None   Collection Time: 05/30/21  7:21 AM   Specimen: Nasopharyngeal Swab;  Nasopharyngeal(NP) swabs in vial transport medium  Result Value Ref Range Status   SARS Coronavirus 2 by RT PCR NEGATIVE NEGATIVE Final    Comment: (NOTE) SARS-CoV-2 target nucleic acids are NOT DETECTED.  The SARS-CoV-2 RNA is generally detectable in upper respiratory specimens during the acute phase of infection. The lowest concentration of SARS-CoV-2 viral copies this assay can detect is 138 copies/mL. A negative result does not preclude SARS-Cov-2 infection and should not be used as the sole basis for treatment or other patient management decisions. A negative result may occur with  improper specimen collection/handling, submission of specimen other than nasopharyngeal swab, presence of viral mutation(s) within the areas targeted by this assay, and inadequate number of viral copies(<138 copies/mL). A negative result must be combined with clinical observations, patient history, and epidemiological information. The expected result is Negative.  Fact Sheet for Patients:  BloggerCourse.com  Fact Sheet for Healthcare Providers:  SeriousBroker.it  This test is no t yet approved or cleared by the [place] FDA and  has been authorized for detection and/or diagnosis of SARS-CoV-2 by FDA under an Emergency Use Authorization (EUA). This EUA will remain  in effect (meaning this test can be used) for the duration of the COVID-19 declaration under Section 564(b)(1) of the Act, 21 U.S.C.section 360bbb-3(b)(1), unless the authorization is terminated  or revoked sooner.       Influenza A by PCR NEGATIVE NEGATIVE Final   Influenza B by PCR NEGATIVE NEGATIVE Final    Comment: (NOTE) The Xpert Xpress SARS-CoV-2/FLU/RSV plus assay is intended as an aid in the diagnosis of influenza from Nasopharyngeal swab specimens and should not be used as a sole basis for treatment. Nasal washings and aspirates are unacceptable for Xpert Xpress  SARS-CoV-2/FLU/RSV testing.  Fact Sheet for Patients: BloggerCourse.com  Fact Sheet for Healthcare Providers: SeriousBroker.it  This test is not yet approved or cleared by the [place] FDA and has been authorized for detection and/or diagnosis of SARS-CoV-2 by FDA under an Emergency Use Authorization (EUA). This EUA will remain in effect (meaning this test can be used) for the duration of the COVID-19 declaration under Section 564(b)(1) of the Act, 21 U.S.C. section 360bbb-3(b)(1), unless the authorization is terminated or revoked.  Performed at [facility], [address] [place], [place] [address]     Studies/Results: No results found.  Medications: Scheduled Meds:  amoxicillin-clavulanate  1 tablet Oral Q12H   enoxaparin (LOVENOX) injection  40 mg Subcutaneous Q24H   HYDROmorphone   Intravenous Q4H   insulin aspart  0-15 Units Subcutaneous TID WC   insulin aspart  0-5 Units Subcutaneous QHS  ketorolac  15 mg Intravenous Q6H   lisinopril  2.5 mg Oral Daily   methadone  10 mg Oral Q8H   senna-docusate  1 tablet Oral BID   Continuous Infusions:  sodium chloride 10 mL/hr at 06/01/21 2158   diphenhydrAMINE     PRN Meds:.sodium chloride, diphenhydrAMINE **OR** diphenhydrAMINE, naloxone **AND** sodium chloride flush, ondansetron (ZOFRAN) IV, polyethylene glycol  Consultants: None  Procedures: None  Antibiotics: Augmentin  Assessment/Plan: Principal Problem:   HCAP (healthcare-associated pneumonia) Active Problems:   Controlled type 2 diabetes mellitus with hyperglycemia (HCC)   Acute respiratory failure with hypoxia (HCC)   Sickle cell crisis (HCC)  Healthcare associated pneumonia: Patient endorses fatigue on today.  Oxygen saturation below 90% on RA.  Ambulating pulse ox today.  Patient may warrant home oxygen.  Will transition to oral antibiotics today.  Continue supportive  care.  Sickle cell pain crisis: Discontinue IV Dilaudid PCA.  Continue Toradol 15 mg IV every 6 hours.  Also, continue home pain medication regimen.  Chronic pain syndrome:  Continue home medications  Anemia of chronic disease: More than likely secondary to sickle cell disease.  Hemoglobin is stable and consistent with patient's baseline.  No blood transfusion warranted on today.  Labs in AM.  Leukocytosis: Stable.  Secondary to HCAP.  Patient afebrile.  Transition to oral antibiotics.  Code Status: Full Code Family Communication: N/A Disposition Plan: Not yet ready for discharge   Dewarren Ledbetter Rennis Petty  APRN, MSN, FNP-C [facility] [address] [place], [place] [address] [phone]  If 7PM-7AM, please contact night-coverage.  06/02/2021, 2:59 PM  LOS: 2 days

## 2021-06-03 ENCOUNTER — Encounter: Payer: Self-pay | Admitting: Family Medicine

## 2021-06-03 DIAGNOSIS — J189 Pneumonia, unspecified organism: Secondary | ICD-10-CM | POA: Diagnosis not present

## 2021-06-03 LAB — CBC
HCT: 20.3 % — ABNORMAL LOW (ref 39.0–52.0)
Hemoglobin: 7.5 g/dL — ABNORMAL LOW (ref 13.0–17.0)
MCH: 32.6 pg (ref 26.0–34.0)
MCHC: 36.9 g/dL — ABNORMAL HIGH (ref 30.0–36.0)
MCV: 88.3 fL (ref 80.0–100.0)
Platelets: 241 10*3/uL (ref 150–400)
RBC: 2.3 MIL/uL — ABNORMAL LOW (ref 4.22–5.81)
RDW: 30.5 % — ABNORMAL HIGH (ref 11.5–15.5)
WBC: 15.5 10*3/uL — ABNORMAL HIGH (ref 4.0–10.5)
nRBC: 9.6 % — ABNORMAL HIGH (ref 0.0–0.2)

## 2021-06-03 LAB — GLUCOSE, CAPILLARY
Glucose-Capillary: 86 mg/dL (ref 70–99)
Glucose-Capillary: 73 mg/dL (ref 70–99)

## 2021-06-03 MED ORDER — ONDANSETRON HCL 4 MG/2ML IJ SOLN
4.0000 mg | Freq: Four times a day (QID) | INTRAMUSCULAR | Status: DC | PRN
  Administered 2021-06-03: 12:00:00 4 mg via INTRAVENOUS
  Filled 2021-06-03: qty 2

## 2021-06-03 NOTE — Plan of Care (Signed)
  Problem: Education: Goal: Knowledge of General Education information will improve Description Including pain rating scale, medication(s)/side effects and non-pharmacologic comfort measures Outcome: Progressing   Problem: Health Behavior/Discharge Planning: Goal: Ability to manage health-related needs will improve Outcome: Progressing   

## 2021-06-03 NOTE — Progress Notes (Signed)
The patient is alert and oriented and has been seen by his physician. The orders for discharge were written. IV has been removed. Went over discharge instructions with patient and family. He is being discharged via wheelchair with all of his belongings.  

## 2021-06-03 NOTE — Progress Notes (Signed)
Notified patient's family that patient's cell phone was left behind. Left a voicemail with callback number. Advised patient's family to call back to let us know they received this message.

## 2021-06-03 NOTE — TOC Transition Note (Signed)
Transition of Care [facility]) - CM/SW Discharge Note   Patient Details  Name: Michael Mullins MRN: [id number] Date of Birth: December 10, 1980  [facility] CM/SW Contact:  Lanier Clam, RN Phone Number: 06/03/2021, 9:33 AM   Clinical Narrative:  Noted qualifying 02 sats, & home 02 order-[employer] rp Jasmine ti deliver home 02 to rm prior d/c. Additional home 02 set up to be delivered to home later. Nsg notified.No further CM needs.    Final next level of care: Home/Self Care Barriers to Discharge: No Barriers Identified   Patient Goals and CMS Choice        Discharge Placement                       Discharge Plan and Services                DME Arranged: Oxygen DME Agency: [employer] Date DME Agency Contacted: 06/03/21 Time DME Agency Contacted: [phone] Representative spoke with at DME Agency: Jasmin            Social Determinants of Health (SDOH) Interventions     Readmission Risk Interventions No flowsheet data found.

## 2021-06-05 NOTE — Discharge Summary (Signed)
Physician Discharge Summary  [name] DUK:[id number] DOB: 1980-12-17 DOA: 05/30/2021  PCP: Tawni Pummel, MD  Admit date: 05/30/2021  Discharge date: 06/05/2021  Discharge Diagnoses:  Principal Problem:   HCAP (healthcare-associated pneumonia) Active Problems:   Controlled type 2 diabetes mellitus with hyperglycemia ([place])   Acute respiratory failure with hypoxia ([place])   Sickle cell crisis ([place])   Discharge Condition: Stable  Disposition:   Follow-up Information     [facility] Follow up.   Why: oxygen Contact information: [place] [address] [phone]         Tawni Pummel, MD .   Specialty: Internal Medicine Contact information: [place] [address] [phone]         Dorena Dew, FNP Follow up.   Specialty: Family Medicine Contact information: [place]. [place] [address] [phone]                Pt is discharged home in good condition and is to follow up with Coy Saunas, Myrna Blazer, MD this week to have labs evaluated. Michael Mullins is instructed to increase activity slowly and balance with rest for the next few days, and use prescribed medication to complete treatment of pain  Diet: Regular Wt Readings from Last 3 Encounters:  05/30/21 99.7 kg  05/28/21 99.2 kg  05/23/21 99.8 kg    History of present illness:  Michael Mullins is a 40 year old male with a medical history significant for sickle cell disease, chronic pain syndrome, opiate dependence and tolerance, and history of anemia of chronic disease, who was just discharged yesterday after admission with sickle cell pain crisis.  Patient was discharged on oral antibiotics.  He said that he felt better when he arrived home initially.  Subsequently, he started feeling worse.  Patient now presents to the ER with worsening chest pain and shortness of breath.  Also, pain all over that is  consistent with his sickle cell crisis.  Evaluation in the ER with repeat imaging confirms worsening pneumonia.  Patient is being readmitted with healthcare associated pneumonia most likely.  He has no fever, but still has leukocytosis.  Patient's symptoms also relapsed back into sickle cell pain crisis.  Hospital Course:  Hospital associated pneumonia: Patient was readmitted with hospital associated pneumonia.  He was continued on IV antibiotics.  Patient had an oxygen requirement of 2 L throughout admission.  Patient's oxygen saturation decreased to around 85% with ambulation.  Patient will discharge home with supplemental oxygen at 2 L.  He will follow-up with his PCP in 1 week for reassessment.  Patient will also require repeat chest x-ray in around 4 weeks. Sickle cell disease with pain crisis: Patient was admitted for sickle cell pain crisis and managed appropriately with IVF, IV Dilaudid via PCA and IV Toradol, as well as other adjunct therapies per sickle cell pain management protocols.  Anemia of chronic disease: Patient's hemoglobin remained stable and consistent with his baseline throughout admission.  Patient is alert, oriented, and ambulating without assistance.  Patient was educated on use of home oxygen.  Patient is aware of all upcoming appointments.  Patient was therefore discharged home today in a hemodynamically stable condition.   [place] will follow-up with PCP within 1 week of this discharge. Zaki was counseled extensively about nonpharmacologic means of pain management, patient verbalized understanding and was appreciative of  the care received during this admission.   We discussed the need for  good hydration, monitoring of hydration status, avoidance of heat, cold, stress, and infection triggers. We discussed the need to be adherent with taking Hydrea and other home medications. Patient was reminded of the need to seek medical attention immediately if any symptom of  bleeding, anemia, or infection occurs.  Discharge Exam: Vitals:   06/03/21 0947 06/03/21 1340  BP: 132/67 (!) 111/53  Pulse: 82 78  Resp: 14 15  Temp: 98.4 F (36.9 C) 98.2 F (36.8 C)  SpO2: 91% (!) 82%   Vitals:   06/03/21 0559 06/03/21 0600 06/03/21 0947 06/03/21 1340  BP: 112/67  132/67 (!) 111/53  Pulse: 72  82 78  Resp: 18  14 15   Temp: 98.1 F (36.7 C)  98.4 F (36.9 C) 98.2 F (36.8 C)  TempSrc: Oral   Oral  SpO2: (!) 84% 94% 91% (!) 82%  Weight:      Height:        General appearance : Awake, alert, not in any distress. Speech Clear. Not toxic looking HEENT: Atraumatic and Normocephalic, pupils equally reactive to light and accomodation Neck: Supple, no JVD. No cervical lymphadenopathy.  Chest: Good air entry bilaterally, no added sounds  CVS: S1 S2 regular, no murmurs.  Abdomen: Bowel sounds present, Non tender and not distended with no gaurding, rigidity or rebound. Extremities: B/L Lower Ext shows no edema, both legs are warm to touch Neurology: Awake alert, and oriented X 3, CN II-XII intact, Non focal Skin: No Rash  Discharge Instructions  Discharge Instructions     Discharge patient   Complete by: As directed    Discharge disposition: 01-Home or Self Care   Discharge patient date: 06/03/2021      Allergies as of 06/03/2021       Reactions   Morphine Hives   Tolerates hydromorphone per MAR   Vancomycin Itching   Tolerates with longer infusion times and premedcation with IV bendryl        Medication List     TAKE these medications    Accutane 40 MG capsule Generic drug: ISOtretinoin Take 40 mg by mouth daily.   amoxicillin-clavulanate 875-125 MG tablet Commonly known as: Augmentin Take 1 tablet by mouth 2 (two) times daily for 7 days.   collagenase ointment Commonly known as: SANTYL Apply to the affected area topically daily.   ferrous sulfate 325 (65 FE) MG tablet Commonly known as: FeroSul Take by mouth.   folic acid 1 MG  tablet Commonly known as: FOLVITE Take 1 tablet by mouth daily What changed:  how much to take how to take this when to take this   guaiFENesin-dextromethorphan 100-10 MG/5ML syrup Commonly known as: ROBITUSSIN DM Take 10 mLs by mouth every 4 (four) hours as needed for cough.   Jardiance 10 MG Tabs tablet Generic drug: empagliflozin Take 1 tablet (10 mg total) by mouth daily.   lisinopril 2.5 MG tablet Commonly known as: ZESTRIL Take 1 tablet (2.5 mg total) by mouth daily.   methadone 10 MG tablet Commonly known as: DOLOPHINE Take 3 tablets by mouth 3 times daily. What changed:  how much to take how to take this when to take this   naloxone 4 MG/0.1ML Liqd nasal spray kit Commonly known as: NARCAN Use as directed every 2-3 minutes until emergency arrives, call 911   ondansetron 4 MG tablet Commonly known as: ZOFRAN Take 1 tablet (4 mg total) by mouth every 8 (eight) hours as needed for Nausea / Vomiting.   Oxbryta 500 MG  Tabs tablet Generic drug: voxelotor Take 1,500 mg by mouth daily.   oxycodone 30 MG immediate release tablet Commonly known as: ROXICODONE Take 1 tablet by mouth every 6 hours as needed for Pain. What changed: reasons to take this   triamcinolone cream 0.1 % Commonly known as: KENALOG Apply to affected skin of arms twice daily as needed. NEVER to face/neck/groin.   Vitamin D (Ergocalciferol) 1.25 MG (50000 UNIT) Caps capsule Commonly known as: DRISDOL Take 1 capsule by mouth every 30 days What changed: Another medication with the same name was changed. Make sure you understand how and when to take each.   Vitamin D (Ergocalciferol) 1.25 MG (50000 UNIT) Caps capsule Commonly known as: DRISDOL Take 1 capsule by mouth every 30 days What changed:  how much to take how to take this when to take this        The results of significant diagnostics from this hospitalization (including imaging, microbiology, ancillary and laboratory) are listed  below for reference.    Significant Diagnostic Studies: DG Ankle Complete Left  Result Date: 05/25/2021 CLINICAL DATA:  Lateral pain and swelling EXAM: LEFT ANKLE COMPLETE - 3+ VIEW COMPARISON:  None. FINDINGS: There is no evidence of fracture, dislocation, or joint effusion. There is no evidence of arthropathy or other focal bone abnormality. Soft tissues are unremarkable. IMPRESSION: Negative. Electronically Signed   By: Donavan Foil M.D.   On: 05/25/2021 21:10   CT Angio Chest PE W and/or Wo Contrast  Result Date: 05/30/2021 CLINICAL DATA:  40 year old male with sickle cell.  Chest pain. EXAM: CT ANGIOGRAPHY CHEST WITH CONTRAST TECHNIQUE: Multidetector CT imaging of the chest was performed using the standard protocol during bolus administration of intravenous contrast. Multiplanar CT image reconstructions and MIPs were obtained to evaluate the vascular anatomy. CONTRAST:  70m OMNIPAQUE IOHEXOL 350 MG/ML SOLN COMPARISON:  CTA chest 05/26/2021. FINDINGS: Cardiovascular: Suboptimal contrast bolus timing in the pulmonary arterial tree. Respiratory motion. No central or hilar pulmonary artery filling defect. But the segmental and distal branches are not evaluated owing to motion and lack of contrast. Mild cardiomegaly. No pericardial effusion. Negative visible aorta. Mediastinum/Nodes: Negative, no mediastinal lymphadenopathy. Lungs/Pleura: Low lung volumes but mildly improved compared to earlier this month. Major airways remain patent. Perihilar and dependent crowding of lung markings, but more indeterminate, confluent peribronchial ground-glass opacity now in the left upper lobe (series 6, image 44) and in both lower lobes. No consolidation. No pleural effusion. Upper Abdomen: Partially calcified atrophic spleen again noted. Negative visible liver, kidneys, adrenal glands, and bowel in the upper abdomen. Musculoskeletal: Sickle cell related abnormal bone mineralization. No acute or suspicious osseous  lesion identified. Review of the MIP images confirms the above findings. IMPRESSION: 1. Suboptimal contrast bolus timing and respiratory motion. No central or hilar pulmonary embolus, but the segmental and distal branches are not evaluated. 2. Similar low lung volumes with some atelectasis. But peribronchial ground-glass opacity in the left upper and both lower lobes now is more indeterminate for atelectasis versus inflammation/infection. No pleural effusion. 3. Sequelae of Sickle Cell Disease.  Mild cardiomegaly. Electronically Signed   By: HGenevie AnnM.D.   On: 05/30/2021 06:19   CT Angio Chest PE W/Cm &/Or Wo Cm  Result Date: 05/26/2021 CLINICAL DATA:  Pulmonary embolism (PE) suspected, positive D-dimer. Sickle cell disease. History of pulmonary embolism. EXAM: CT ANGIOGRAPHY CHEST WITH CONTRAST TECHNIQUE: Multidetector CT imaging of the chest was performed using the standard protocol during bolus administration of intravenous contrast. Multiplanar CT  image reconstructions and MIPs were obtained to evaluate the vascular anatomy. CONTRAST:  33m OMNIPAQUE IOHEXOL 350 MG/ML SOLN COMPARISON:  11/06/2006 FINDINGS: Cardiovascular: Adequate opacification of the pulmonary arterial tree. No intraluminal filling defect identified to suggest acute pulmonary embolism. Central pulmonary arteries are of normal caliber. No significant coronary artery calcification. Cardiac size is mildly enlarged, stable since prior examination. No pericardial effusion. No significant atherosclerotic calcification within the thoracic aorta. No aortic aneurysm. Mediastinum/Nodes: No enlarged mediastinal, hilar, or axillary lymph nodes. Thyroid gland, trachea, and esophagus demonstrate no significant findings. Lungs/Pleura: Linear atelectasis noted at the lung bases bilaterally. Superimposed ground-glass opacity at the lung bases may represent atelectasis or mild infiltrate. 6 mm noncalcified pulmonary nodule noted within the subpleural right  lower lobe, axial image # 46/6. No pneumothorax or pleural effusion. 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  No acute abnormality. Musculoskeletal: Avascular necrosis of the humeral heads bilaterally without associated subarticular collapse. Osseous structures are diffusely sclerotic in keeping with chronic changes of sickle cell disease. No acute bone abnormality. Review of the MIP images confirms the above findings. IMPRESSION: No pulmonary embolism. Stable cardiomegaly. Bibasilar ground-glass pulmonary opacity may represent subtle infiltrate in the setting of acute chest syndrome. 6 mm right solid pulmonary nodule. Recommend a non-contrast Chest CT at 6-12 months. If patient is high risk for malignancy, recommend an additional non-contrast Chest CT at 18-24 months; if patient is low risk for malignancy a non-contrast Chest CT at 18-24 months is optional. These guidelines do not apply to immunocompromised patients and patients with cancer. Follow up in patients with significant comorbidities as clinically warranted. For lung cancer screening, adhere to Lung-RADS guidelines. Reference: Radiology. 2017; 284(1):228-43. 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 Electronically Signed   By: AFidela SalisburyM.D.   On: 05/26/2021 02:00   DG Chest Portable 1 View  Result Date: 05/30/2021 CLINICAL DATA:  Chest pain. EXAM: PORTABLE CHEST 1 VIEW COMPARISON:  05/25/2021. FINDINGS: The heart size and mediastinal contours are within normal limits. Mild atelectasis at the lung bases. No effusion or pneumothorax. The visualized skeletal structures are unremarkable. IMPRESSION: Mild atelectasis at the lung bases. Electronically Signed   By: LBrett FairyM.D.   On: 05/30/2021 02:55   DG Chest Port 1 View  Result Date: 05/25/2021 CLINICAL DATA:  Hypoxia.  Sickle cell crisis. EXAM: PORTABLE CHEST 1 VIEW COMPARISON:  January 04, 2020 FINDINGS: The heart size and mediastinal  contours are within normal limits. Both lungs are clear. The visualized skeletal structures are unremarkable. IMPRESSION: No active disease. Electronically Signed   By: DDorise BullionIII M.D.   On: 05/25/2021 19:55    Microbiology: Recent Results (from the past 240 hour(s))  MRSA Next Gen by PCR, Nasal     Status: None   Collection Time: 05/27/21  9:12 AM   Specimen: Nasal Mucosa; Nasal Swab  Result Value Ref Range Status   MRSA by PCR Next Gen NOT DETECTED NOT DETECTED Final    Comment: (NOTE) The GeneXpert MRSA Assay (FDA approved for NASAL specimens only), is one component of a comprehensive MRSA colonization surveillance program. It is not intended to diagnose MRSA infection nor to guide or monitor treatment for MRSA infections. Test performance is not FDA approved in patients less than 25years old. [facility] [address], [place] [address]  Resp Panel by RT-PCR (Flu A&B, Covid) Nasopharyngeal Swab     Status:  None   Collection Time: 05/30/21  7:21 AM   Specimen: Nasopharyngeal Swab; Nasopharyngeal(NP) swabs in vial transport medium  Result Value Ref Range Status   SARS Coronavirus 2 by RT PCR NEGATIVE NEGATIVE Final    Comment: (NOTE) SARS-CoV-2 target nucleic acids are NOT DETECTED.  The SARS-CoV-2 RNA is generally detectable in upper respiratory specimens during the acute phase of infection. The lowest concentration of SARS-CoV-2 viral copies this assay can detect is 138 copies/mL. A negative result does not preclude SARS-Cov-2 infection and should not be used as the sole basis for treatment or other patient management decisions. A negative result may occur with  improper specimen collection/handling, submission of specimen other than nasopharyngeal swab, presence of viral mutation(s) within the areas targeted by this assay, and inadequate number of viral copies(<138 copies/mL). A negative result must be combined  with clinical observations, patient history, and epidemiological information. The expected result is Negative.  Fact Sheet for Patients:  EntrepreneurPulse.com.au  Fact Sheet for Healthcare Providers:  IncredibleEmployment.be  This test is no t yet approved or cleared by the [place] FDA and  has been authorized for detection and/or diagnosis of SARS-CoV-2 by FDA under an Emergency Use Authorization (EUA). This EUA will remain  in effect (meaning this test can be used) for the duration of the COVID-19 declaration under Section 564(b)(1) of the Act, 21 U.S.C.section 360bbb-3(b)(1), unless the authorization is terminated  or revoked sooner.       Influenza A by PCR NEGATIVE NEGATIVE Final   Influenza B by PCR NEGATIVE NEGATIVE Final    Comment: (NOTE) The Xpert Xpress SARS-CoV-2/FLU/RSV plus assay is intended as an aid in the diagnosis of influenza from Nasopharyngeal swab specimens and should not be used as a sole basis for treatment. Nasal washings and aspirates are unacceptable for Xpert Xpress SARS-CoV-2/FLU/RSV testing.  Fact Sheet for Patients: EntrepreneurPulse.com.au  Fact Sheet for Healthcare Providers: IncredibleEmployment.be  This test is not yet approved or cleared by the [place] FDA and has been authorized for detection and/or diagnosis of SARS-CoV-2 by FDA under an Emergency Use Authorization (EUA). This EUA will remain in effect (meaning this test can be used) for the duration of the COVID-19 declaration under Section 564(b)(1) of the Act, 21 U.S.C. section 360bbb-3(b)(1), unless the authorization is terminated or revoked.  Performed at [facility], [place] [address], [place], [place] [address]      Labs: Basic Metabolic Panel: Recent Labs  Lab 05/30/21 0317 05/30/21 1200 05/31/21 0336 06/01/21 0729  NA 135  --  135 138  K 3.8  --  4.1 3.9  CL 103   --  107 109  CO2 24  --  25 24  GLUCOSE 154*  --  127* 104*  BUN 9  --  11 9  CREATININE 0.60* 0.66 0.70 0.53*  CALCIUM 8.8*  --  8.4* 8.6*   Liver Function Tests: Recent Labs  Lab 05/30/21 0317 05/31/21 0336 06/01/21 0729  AST 64* 49* 34  ALT 43 34 27  ALKPHOS 89 72 73  BILITOT 6.7* 3.5* 3.4*  PROT 8.2* 7.2 7.0  ALBUMIN 4.1 3.6 3.4*   No results for input(s): LIPASE, AMYLASE in the last 168 hours. No results for input(s): AMMONIA in the last 168 hours. CBC: Recent Labs  Lab 05/30/21 0317 05/30/21 1200 05/31/21 0336 06/01/21 0729 06/02/21 1310 06/03/21 0939  WBC 17.9* 15.0* 11.3* 12.7* 13.9* 15.5*  NEUTROABS 13.4*  --  5.1 5.8  --   --  HGB 9.1* 7.7* 7.9* 7.7* 8.3* 7.5*  HCT 24.9* 21.3* 21.2* 21.0* 23.0* 20.3*  MCV 86.8 87.3 86.2 89.0 90.2 88.3  PLT 294 242 206 234 275 241   Cardiac Enzymes: No results for input(s): CKTOTAL, CKMB, CKMBINDEX, TROPONINI in the last 168 hours. BNP: Invalid input(s): POCBNP CBG: Recent Labs  Lab 06/02/21 1143 06/02/21 1708 06/02/21 2127 06/03/21 0748 06/03/21 1218  GLUCAP 116* 71 99 86 73    Time coordinating discharge: 40 minutes  Signed:  Donia Pounds  APRN, MSN, FNP-C [facility] [address] [place], [place] [address] [phone]  [facility] 06/05/2021, 4:36 PM

## 2021-06-06 ENCOUNTER — Other Ambulatory Visit: Payer: Self-pay | Admitting: *Deleted

## 2021-06-06 DIAGNOSIS — J9601 Acute respiratory failure with hypoxia: Secondary | ICD-10-CM | POA: Diagnosis not present

## 2021-06-06 DIAGNOSIS — J189 Pneumonia, unspecified organism: Secondary | ICD-10-CM | POA: Diagnosis not present

## 2021-06-06 NOTE — Patient Instructions (Addendum)
Visit Information  Thank you for taking time to visit with me today. Please don't hesitate to contact me if I can be of assistance to you before our next scheduled telephone appointment.  Following are the goals we discussed today:  Pt Goals/Self Activities: Take all medications as prescribed Attend all scheduled provider appointments Call pharmacy for medication refills 3-7 days in advance of running out of medications Perform all self care activities independently  Call provider office for new concerns or questions     Elliot Cousin, RN Care Management Coordinator [facility] Main Office [phone]

## 2021-06-06 NOTE — Patient Outreach (Signed)
[facility]) Care Management  [facility] Care Manager  06/06/2021   QUINDELL SHERE 1980/12/31 [id number]  Transition of care (weekly follow up) Spoke with pt today and enrolled into the program and services. Pt verifies a good support system, and aware of all  his medications. Pt has a follow up appointment with his provider pending on 1/5. Pt verified he has received home O2 via [employer] on 2 liters. Verified pt's awareness on all his medications and did not need review at this time.  Will follow up next week for ongoing Transition of Care.  Encounter Medications:  Outpatient Encounter Medications as of 06/06/2021  Medication Sig   ACCUTANE 40 MG capsule Take 40 mg by mouth daily.   amoxicillin-clavulanate (AUGMENTIN) 875-125 MG tablet Take 1 tablet by mouth 2 (two) times daily for 7 days.   collagenase (SANTYL) ointment Apply to the affected area topically daily. (Patient not taking: Reported on 05/25/2021)   empagliflozin (JARDIANCE) 10 MG TABS tablet Take 1 tablet (10 mg total) by mouth daily.   ergocalciferol (VITAMIN D2) 1.25 MG (50000 UT) capsule Take 1 capsule by mouth every 30 days (Patient not taking: Reported on 05/25/2021)   ferrous sulfate (FEROSUL) 325 (65 FE) MG tablet Take by mouth. (Patient not taking: Reported on 05/25/2021)   folic acid (FOLVITE) 1 MG tablet Take 1 tablet by mouth daily (Patient taking differently: Take 1 mg by mouth daily.)   guaiFENesin-dextromethorphan (ROBITUSSIN DM) 100-10 MG/5ML syrup Take 10 mLs by mouth every 4 (four) hours as needed for cough.   lisinopril (ZESTRIL) 2.5 MG tablet Take 1 tablet (2.5 mg total) by mouth daily.   methadone (DOLOPHINE) 10 MG tablet Take 3 tablets by mouth 3 times daily. (Patient taking differently: Take 10 mg by mouth every 8 (eight) hours.)   naloxone (NARCAN) nasal spray 4 mg/0.1 mL Use as directed every 2-3 minutes until emergency arrives, call 911 (Patient not taking: Reported on 05/25/2021)    ondansetron (ZOFRAN) 4 MG tablet Take 1 tablet (4 mg total) by mouth every 8 (eight) hours as needed for Nausea / Vomiting.   OXBRYTA 500 MG TABS tablet Take 1,500 mg by mouth daily.   oxycodone (ROXICODONE) 30 MG immediate release tablet Take 1 tablet by mouth every 6 hours as needed for Pain. (Patient taking differently: Take 30 mg by mouth every 6 (six) hours as needed for pain.)   triamcinolone cream (KENALOG) 0.1 % Apply to affected skin of arms twice daily as needed. NEVER to face/neck/groin. (Patient not taking: Reported on 01/19/2021)   Vitamin D, Ergocalciferol, (DRISDOL) 1.25 MG (50000 UNIT) CAPS capsule Take 1 capsule by mouth every 30 days (Patient taking differently: Take 50,000 Units by mouth every 30 (thirty) days.)   No facility-administered encounter medications on file as of 06/06/2021.    Functional Status:  In your present state of health, do you have any difficulty performing the following activities: 05/30/2021 05/28/2021  Hearing? N N  Vision? N N  Difficulty concentrating or making decisions? N N  Walking or climbing stairs? N N  Comment - -  Dressing or bathing? N N  Doing errands, shopping? N N  Some recent data might be hidden    Fall/Depression Screening: No flowsheet data found. No flowsheet data found.  Assessment:   Care Plan Care Plan : RN Care Manger  Updates made by Alejandro Mulling, RN since 06/06/2021 12:00 AM     Problem: Knowledge deficit related to Sickle Cell Disease and care  coordination needs   Priority: High  Onset Date: 06/06/2021     Long-Range Goal: Development Plan of care for management of Sickle Cell   Start Date: 06/06/2021  Expected End Date: 09/07/2021  This Visit's Progress: On track  Priority: High  Note:   Current Barriers:  Knowledge Deficits related to plan of care for management of Sickle Cell symptom.   RNCM Clinical Goal(s):  Patient will verbalize basic understanding of  sickle cell crises  disease process and  self management plan as evidence by reduced symptoms through collaboration with RN Care manager, provider, and care team.   Interventions: Inter-disciplinary care team collaboration (see longitudinal plan of care) Evaluation of current treatment plan related to  self management and patient's adherence to plan as established by provider  Current Barriers: Sickle Cell  (Status:  New goal.)  Long Term Goal Discussed plans with patient for ongoing care management follow up and provided patient with direct contact information for care management team Evaluation of current treatment plan related to Sickle Cell and patient's adherence to plan as established by provider  Patient Goals/Self-Care Activities: Take all medications as prescribed Attend all scheduled provider appointments Call pharmacy for medication refills 3-7 days in advance of running out of medications Perform all self care activities independently  Call provider office for new concerns or questions   Follow Up Plan:  Telephone follow up appointment with care management team member scheduled for:  Jan 4,2023 The patient has been provided with contact information for the care management team and has been advised to call with any health related questions or concerns.  Knowledge Deficits related to plan of care for management of Sickle Cell symptoms      Elliot Cousin, RN Care Management Coordinator [facility] Main Office [phone]

## 2021-06-07 ENCOUNTER — Other Ambulatory Visit: Payer: Self-pay | Admitting: *Deleted

## 2021-06-07 NOTE — Patient Outreach (Signed)
[facility]) Care Management  06/07/2021  Michael Mullins [birth year] [id number]   EMMI-GENERAL DISCHARGE RED ON EMMI ALERT Day #1 Date:06/05/2021 Red Alert Reason: No follow up appointment  RN spoke with pt yesterday and confirm his pending appointment is on 1/5 with primary provider. RN able to enroll pt into the [facility] program and services for transition of care calls and disease management for his ongoing Sickle Cell disease.  Close EMMI as resolved.  Elliot Cousin, RN Care Management Coordinator [facility] Main Office [phone]

## 2021-06-08 ENCOUNTER — Other Ambulatory Visit: Payer: Self-pay | Admitting: *Deleted

## 2021-06-08 ENCOUNTER — Encounter (HOSPITAL_COMMUNITY): Payer: Self-pay

## 2021-06-08 ENCOUNTER — Emergency Department (HOSPITAL_COMMUNITY)
Admission: EM | Admit: 2021-06-08 | Discharge: 2021-06-08 | Disposition: A | Discharge status: Home / Self Care | Payer: 59 | Attending: Emergency Medicine | Admitting: Emergency Medicine

## 2021-06-08 ENCOUNTER — Emergency Department (HOSPITAL_COMMUNITY): Payer: 59

## 2021-06-08 DIAGNOSIS — R42 Dizziness and giddiness: Secondary | ICD-10-CM | POA: Insufficient documentation

## 2021-06-08 DIAGNOSIS — Z79899 Other long term (current) drug therapy: Secondary | ICD-10-CM | POA: Diagnosis not present

## 2021-06-08 DIAGNOSIS — R55 Syncope and collapse: Secondary | ICD-10-CM | POA: Diagnosis not present

## 2021-06-08 DIAGNOSIS — J811 Chronic pulmonary edema: Secondary | ICD-10-CM | POA: Diagnosis not present

## 2021-06-08 DIAGNOSIS — R519 Headache, unspecified: Secondary | ICD-10-CM | POA: Diagnosis not present

## 2021-06-08 DIAGNOSIS — D5700 Hb-SS disease with crisis, unspecified: Secondary | ICD-10-CM | POA: Insufficient documentation

## 2021-06-08 DIAGNOSIS — R61 Generalized hyperhidrosis: Secondary | ICD-10-CM | POA: Diagnosis not present

## 2021-06-08 DIAGNOSIS — E119 Type 2 diabetes mellitus without complications: Secondary | ICD-10-CM | POA: Diagnosis not present

## 2021-06-08 DIAGNOSIS — R9431 Abnormal electrocardiogram [ECG] [EKG]: Secondary | ICD-10-CM | POA: Diagnosis not present

## 2021-06-08 DIAGNOSIS — I517 Cardiomegaly: Secondary | ICD-10-CM | POA: Diagnosis not present

## 2021-06-08 LAB — RETICULOCYTES
Immature Retic Fract: 43.1 % — ABNORMAL HIGH (ref 2.3–15.9)
RBC.: 2.9 MIL/uL — ABNORMAL LOW (ref 4.22–5.81)
Retic Count, Absolute: 437.3 10*3/uL — ABNORMAL HIGH (ref 19.0–186.0)
Retic Ct Pct: 15.1 % — ABNORMAL HIGH (ref 0.4–3.1)

## 2021-06-08 LAB — CBC WITH DIFFERENTIAL/PLATELET
Abs Immature Granulocytes: 0.03 10*3/uL (ref 0.00–0.07)
Basophils Absolute: 0.1 10*3/uL (ref 0.0–0.1)
Basophils Relative: 1 %
Eosinophils Absolute: 0.3 10*3/uL (ref 0.0–0.5)
Eosinophils Relative: 3 %
HCT: 26.7 % — ABNORMAL LOW (ref 39.0–52.0)
Hemoglobin: 9.5 g/dL — ABNORMAL LOW (ref 13.0–17.0)
Immature Granulocytes: 0 %
Lymphocytes Relative: 37 %
Lymphs Abs: 3.4 10*3/uL (ref 0.7–4.0)
MCH: 33.1 pg (ref 26.0–34.0)
MCHC: 35.6 g/dL (ref 30.0–36.0)
MCV: 93 fL (ref 80.0–100.0)
Monocytes Absolute: 1.8 10*3/uL — ABNORMAL HIGH (ref 0.1–1.0)
Monocytes Relative: 20 %
Neutro Abs: 3.5 10*3/uL (ref 1.7–7.7)
Neutrophils Relative %: 39 %
Platelets: 366 10*3/uL (ref 150–400)
RBC: 2.87 MIL/uL — ABNORMAL LOW (ref 4.22–5.81)
RDW: 23.9 % — ABNORMAL HIGH (ref 11.5–15.5)
WBC: 9 10*3/uL (ref 4.0–10.5)
nRBC: 8.2 % — ABNORMAL HIGH (ref 0.0–0.2)

## 2021-06-08 LAB — COMPREHENSIVE METABOLIC PANEL
ALT: 26 U/L (ref 0–44)
AST: 47 U/L — ABNORMAL HIGH (ref 15–41)
Albumin: 4 g/dL (ref 3.5–5.0)
Alkaline Phosphatase: 62 U/L (ref 38–126)
Anion gap: 6 (ref 5–15)
BUN: 8 mg/dL (ref 6–20)
CO2: 27 mmol/L (ref 22–32)
Calcium: 8.9 mg/dL (ref 8.9–10.3)
Chloride: 105 mmol/L (ref 98–111)
Creatinine, Ser: 0.64 mg/dL (ref 0.61–1.24)
GFR, Estimated: >60 mL/min (ref >60)
Glucose, Bld: 109 mg/dL — ABNORMAL HIGH (ref 70–99)
Potassium: 3.6 mmol/L (ref 3.5–5.1)
Sodium: 138 mmol/L (ref 135–145)
Total Bilirubin: 2.3 mg/dL — ABNORMAL HIGH (ref 0.3–1.2)
Total Protein: 7.7 g/dL (ref 6.5–8.1)

## 2021-06-08 LAB — TROPONIN I (HIGH SENSITIVITY)
Troponin I (High Sensitivity): 5 ng/L (ref <18)
Troponin I (High Sensitivity): 5 ng/L (ref <18)

## 2021-06-08 IMAGING — CT CT HEAD W/O CM
3 series · 16 of 47 positions shown, 19 images · non-contrast
Comparison: No priors.

CLINICAL DATA: 40-year-old male with history of new or worsening
headache with intermittent weakness and dizziness for the past week.

EXAM:
CT HEAD WITHOUT CONTRAST
TECHNIQUE: Contiguous axial images were obtained from the base of the skull
through the vertex without intravenous contrast.

[Series 2: head wo · axial · 0.47mm/px · z∈[-94,+36]mm · 10 of 32 slices shown, 13 images]
[im 3/32  brain]
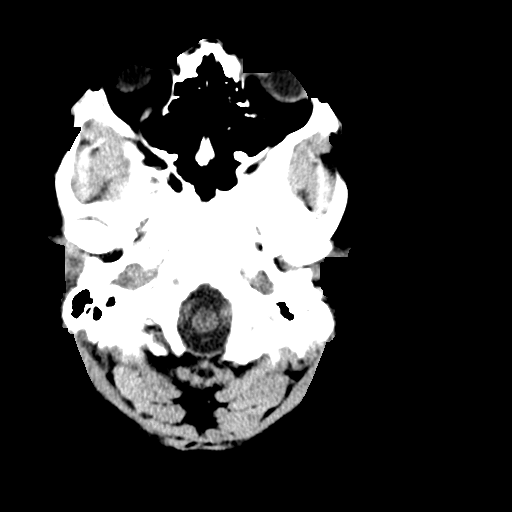
[im 3/32  bone]
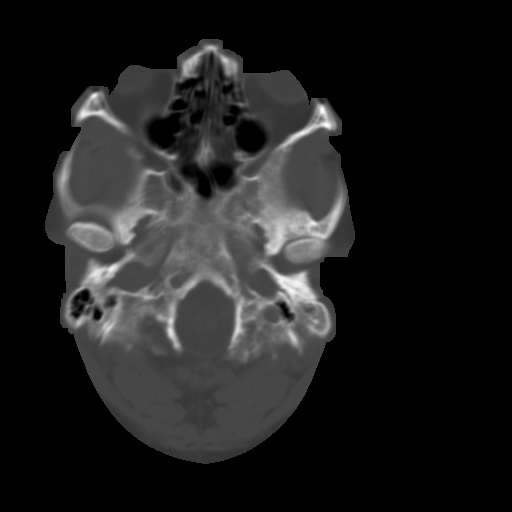
[im 6/32  brain]
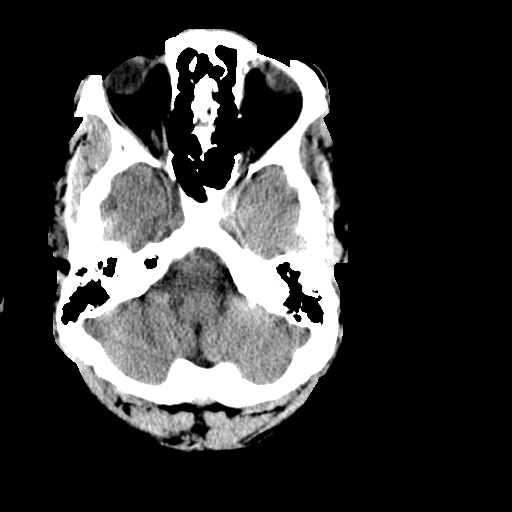
[im 9/32  brain]
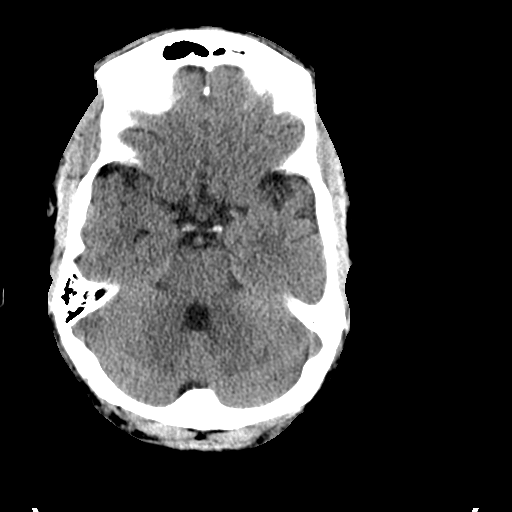
[im 11/32  brain]
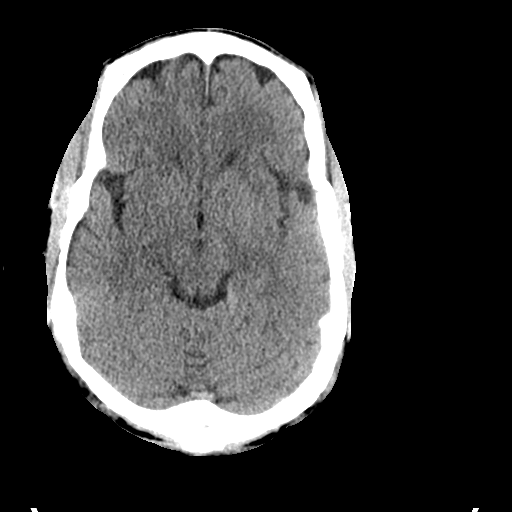
[im 14/32  brain]
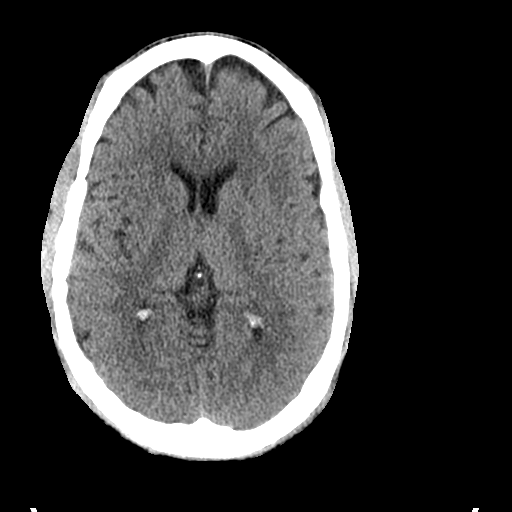
[im 14/32  bone]
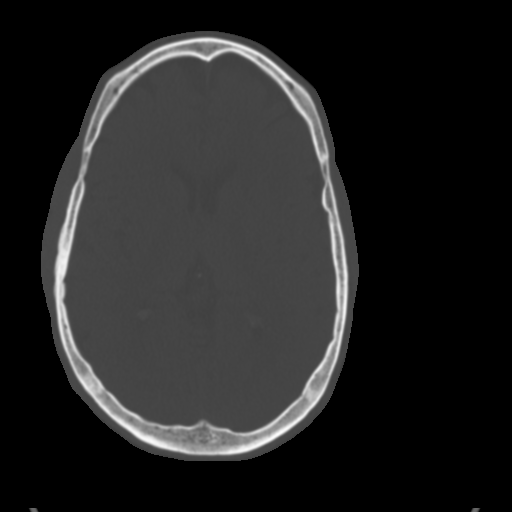
[im 18/32  brain]
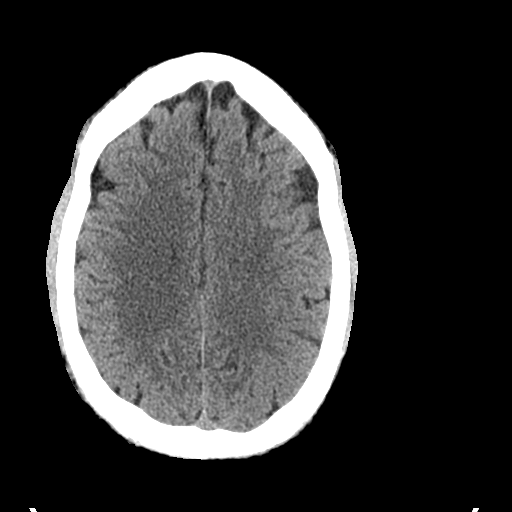
[im 21/32  brain]
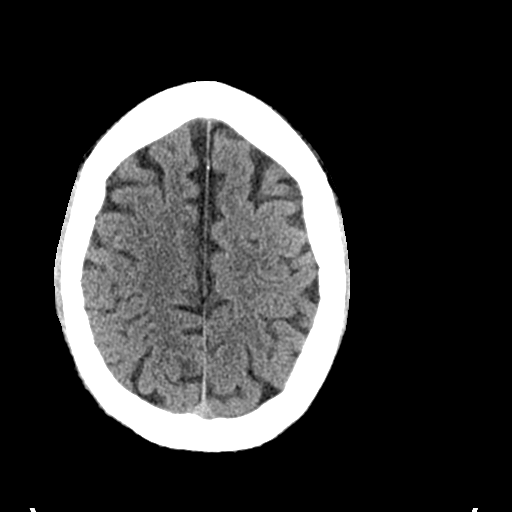
[im 24/32  brain]
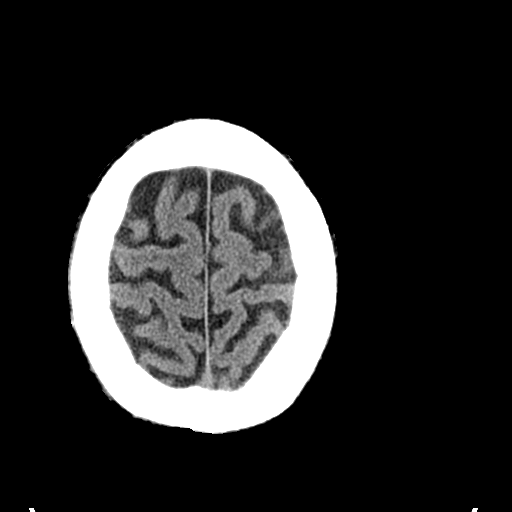
[im 26/32  brain]
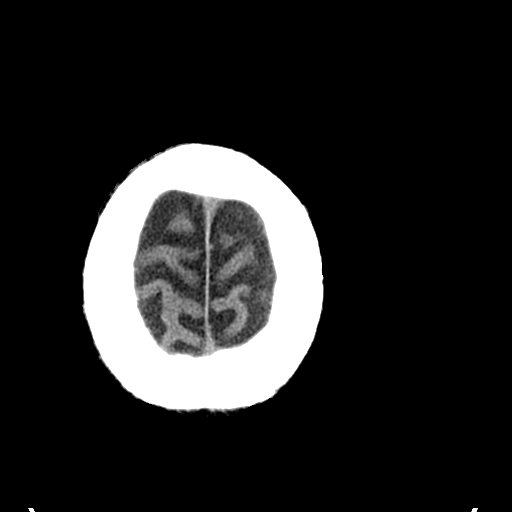
[im 26/32  bone]
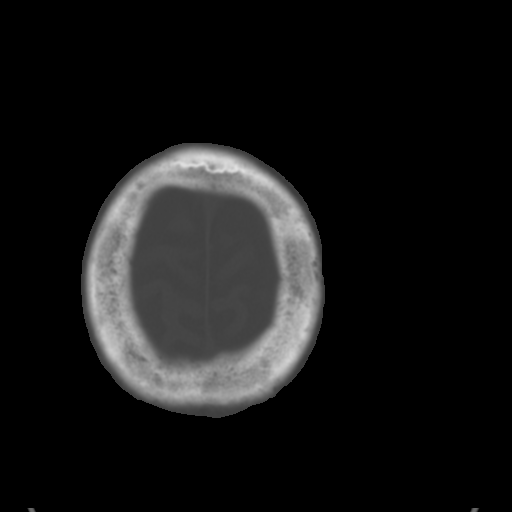
[im 29/32  brain]
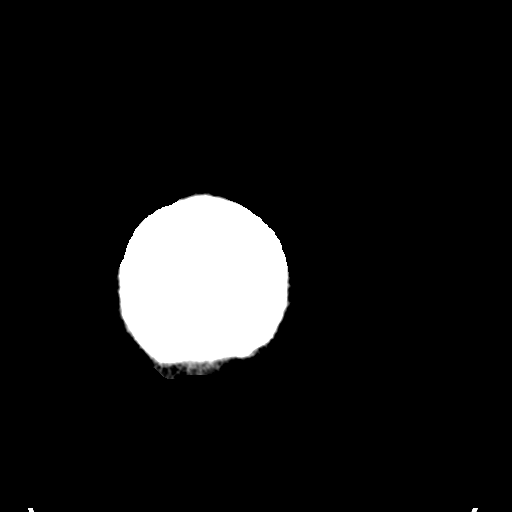

[Series 4: coronal soft tissue · coronal · 0.30mm/px · 3 of 68 slices shown]
[im 23/68  brain]
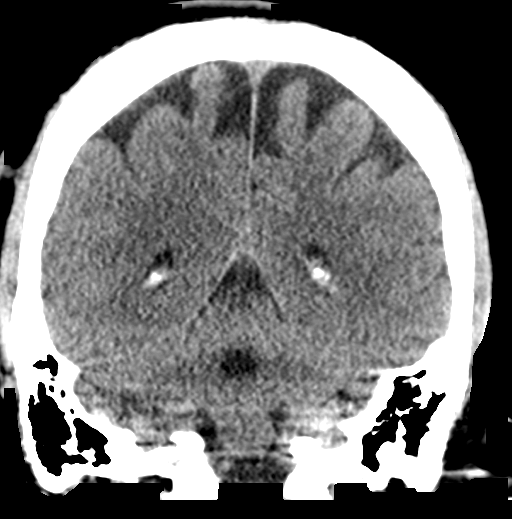
[im 30/68  brain]
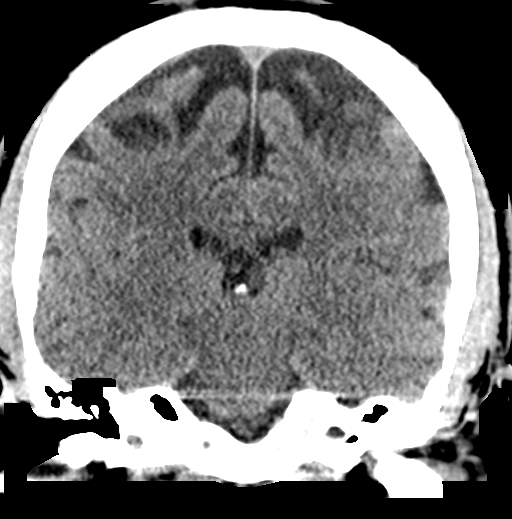
[im 38/68  brain]
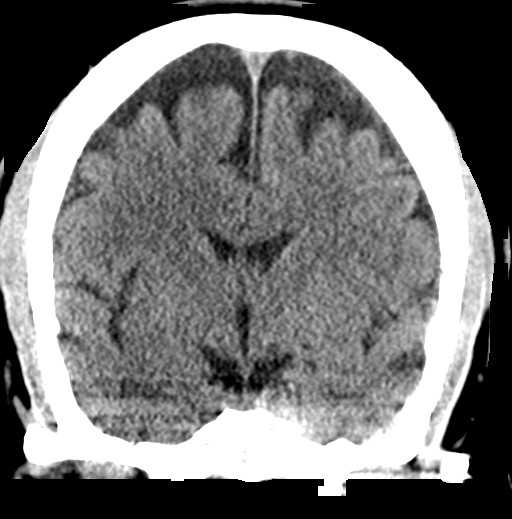

[Series 5: sagittal soft tissue · sagittal · 0.30mm/px · 3 of 50 slices shown]
[im 17/50  brain]
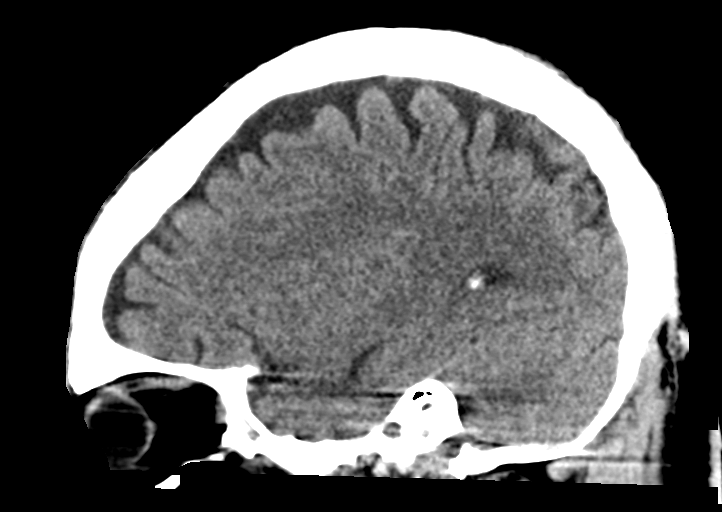
[im 25/50  brain]
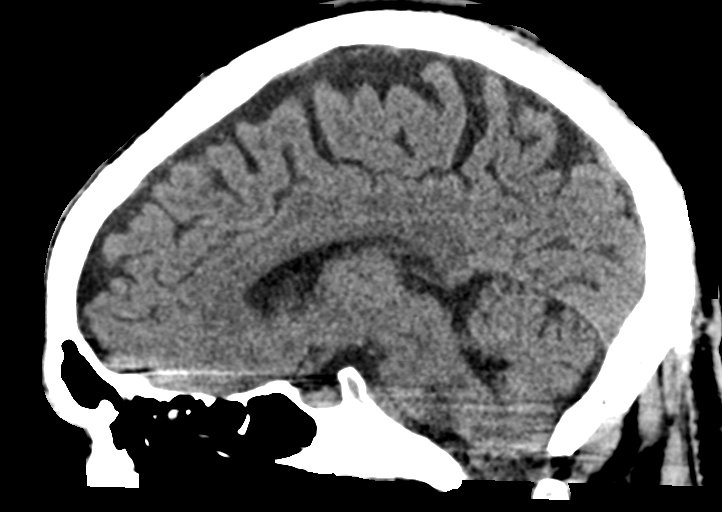
[im 33/50  brain]
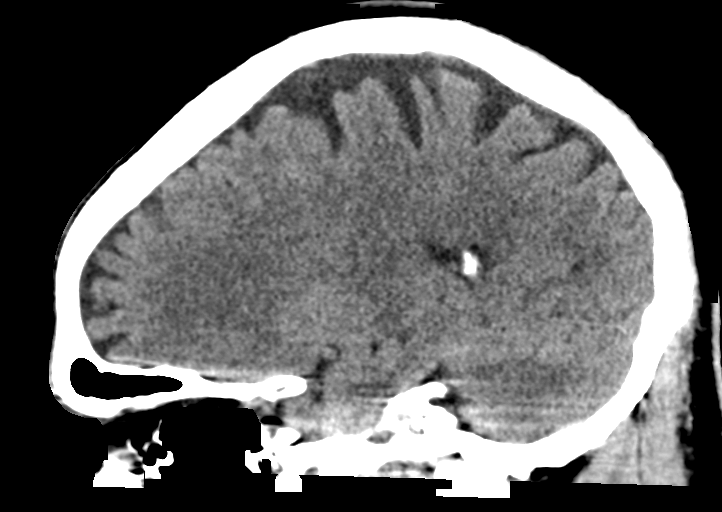

[16 of 47 positions shown; findings below may reference images not displayed]

FINDINGS: Brain: No evidence of acute infarction, hemorrhage, hydrocephalus,
extra-axial collection or mass lesion/mass effect.

Vascular: No hyperdense vessel or unexpected calcification.

Skull: Normal. Negative for fracture or focal lesion.

Sinuses/Orbits: No acute finding.

Other: None.
IMPRESSION: 1. No acute intracranial abnormalities. The appearance of the brain
is normal.

## 2021-06-08 IMAGING — CR DG CHEST 2V
2 series · 2 of 2 positions shown · non-contrast
Comparison: Chest x-ray [DATE].

CLINICAL DATA: 40-year-old male with history of weakness. Sickle
cell disease.

EXAM:
CHEST - 2 VIEW

[w chest lat]
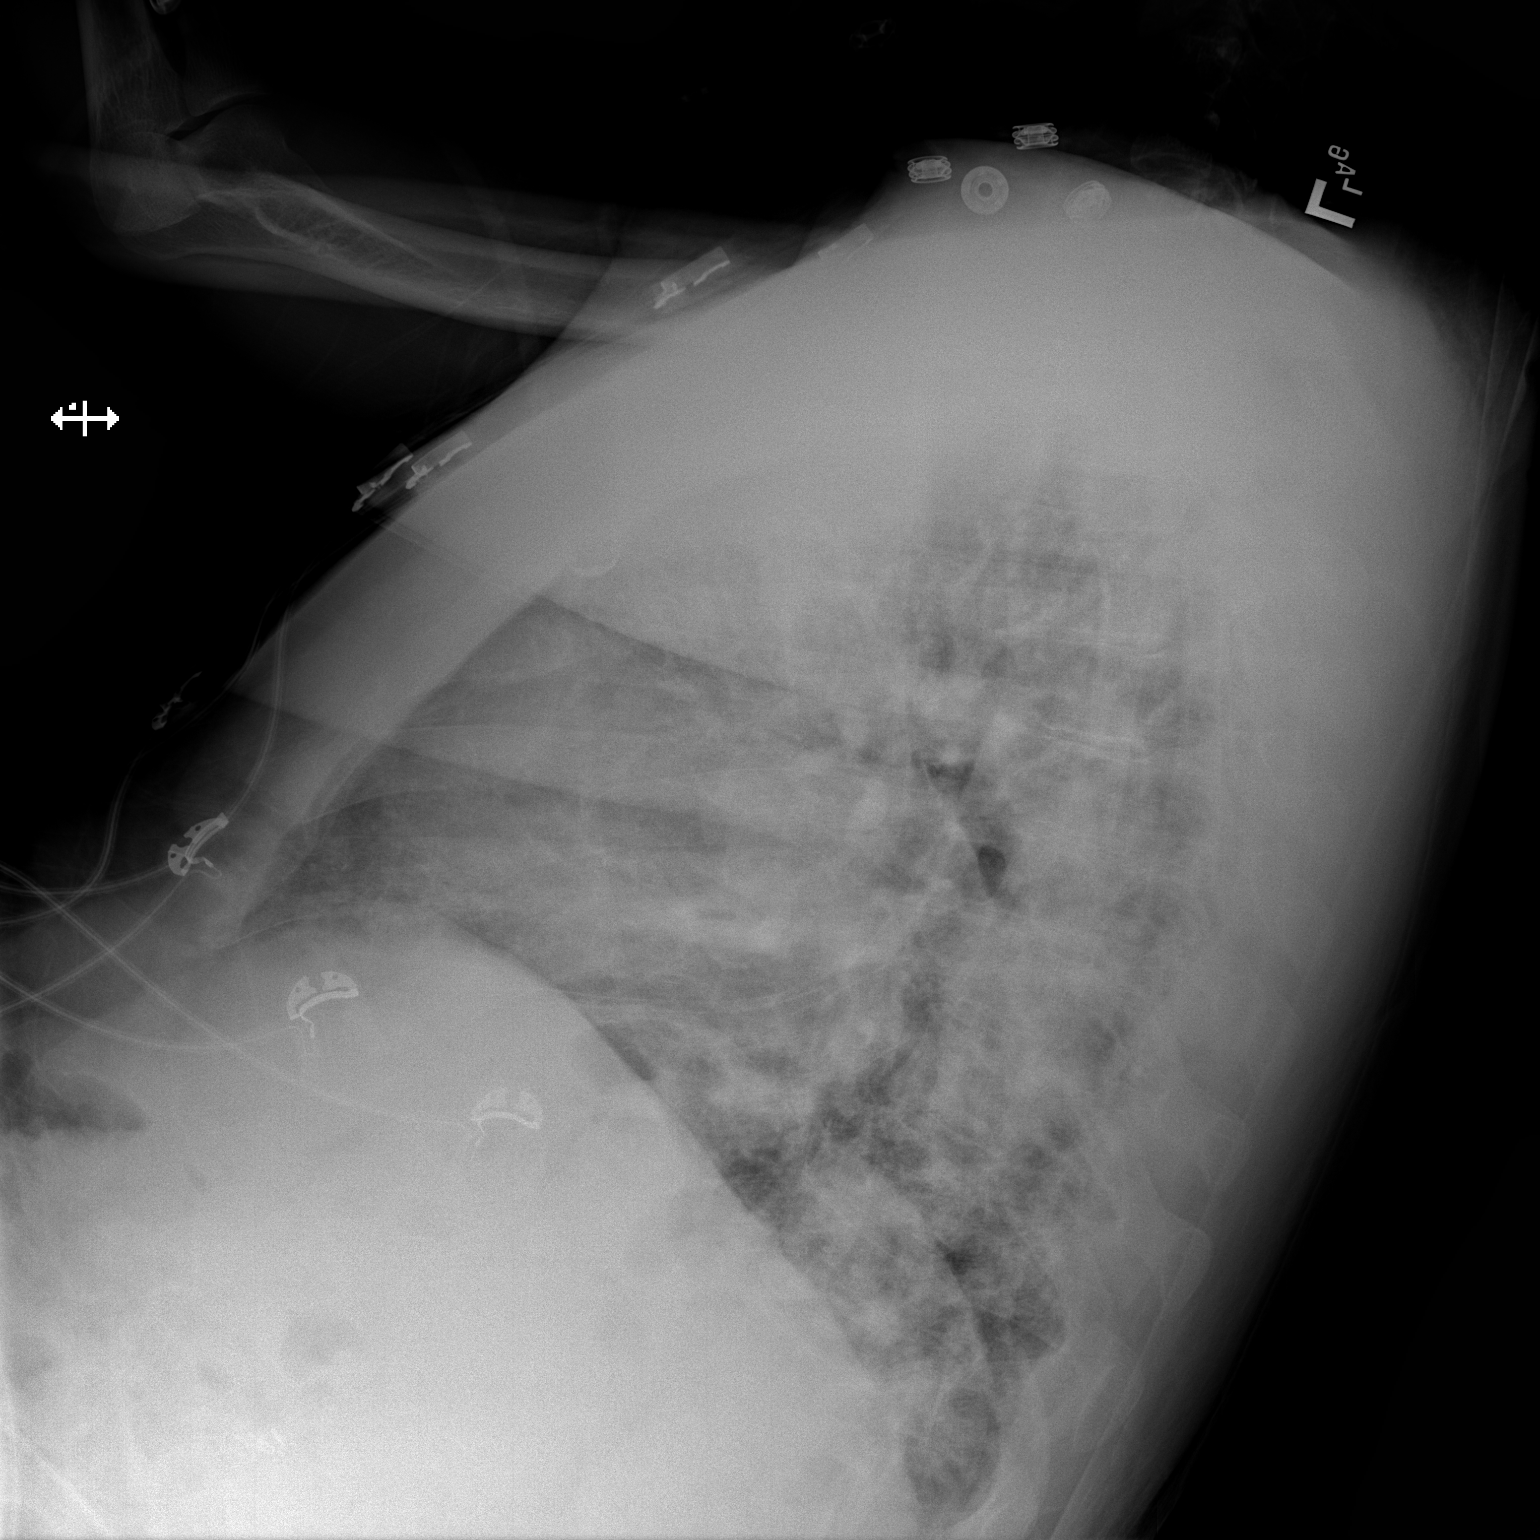

[x chest ap]
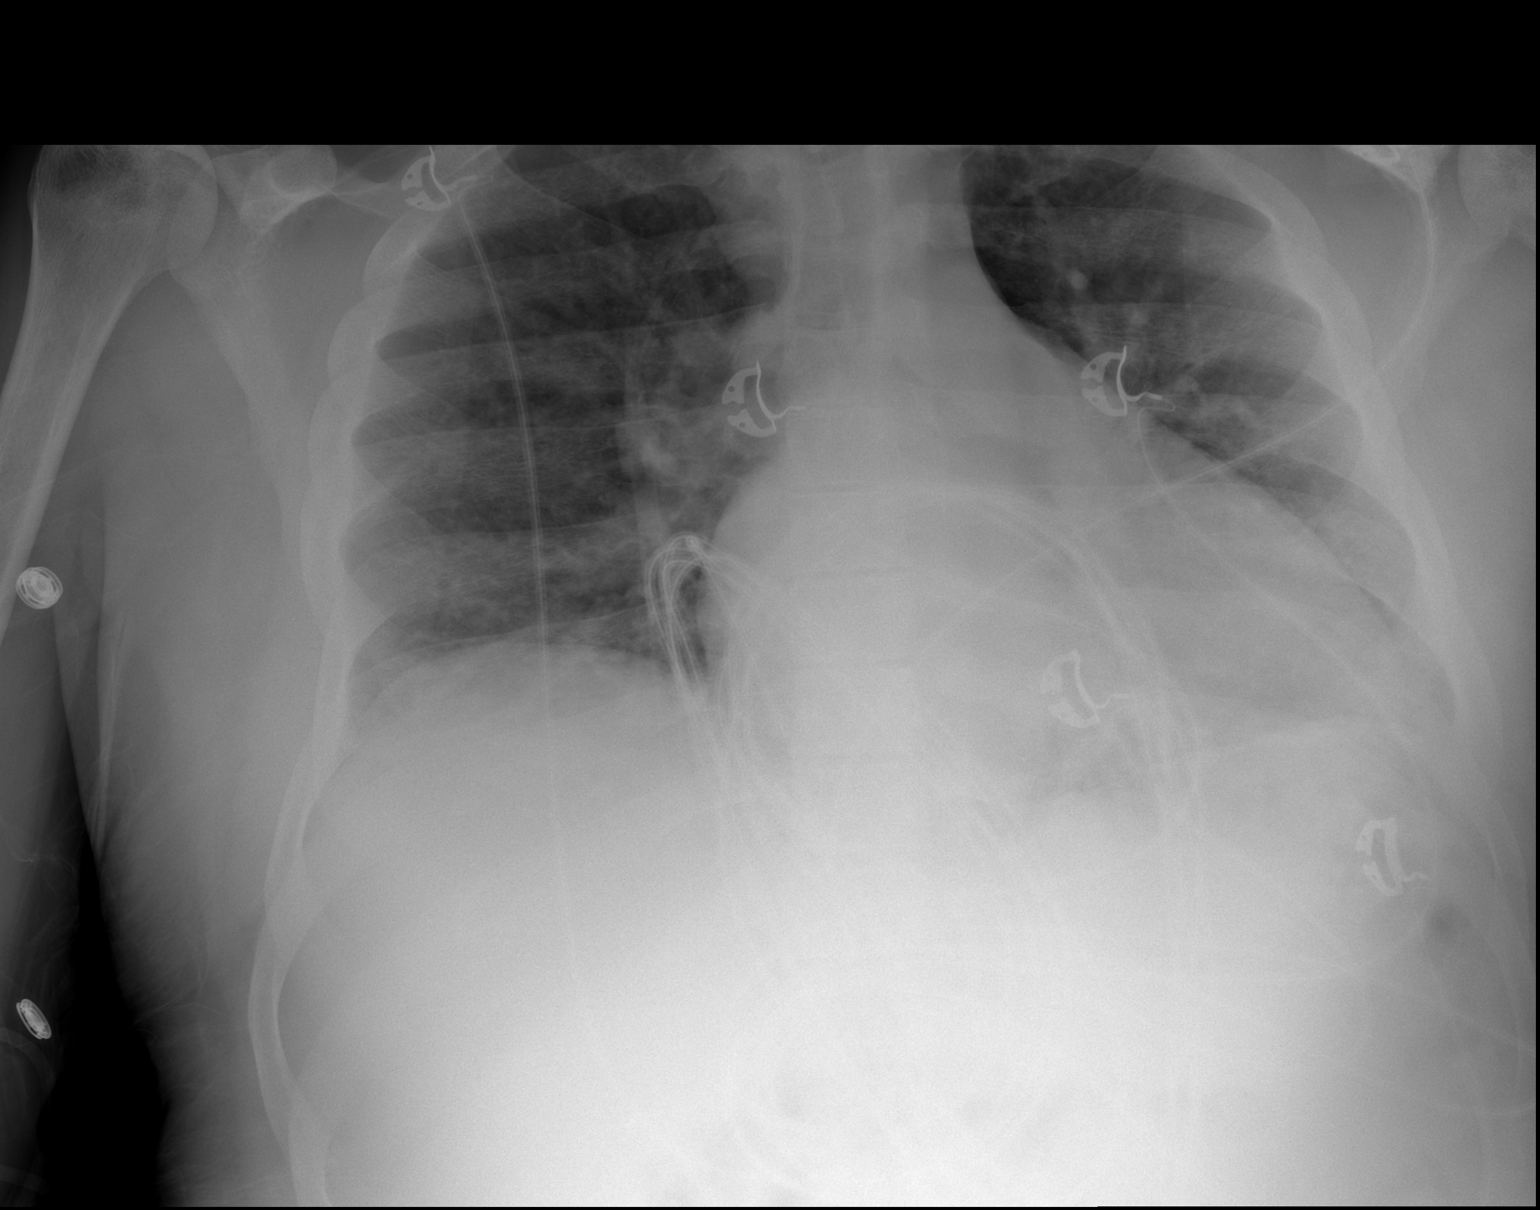

[2 of 2 positions shown; findings below may reference images not displayed]

FINDINGS: Lung volumes are low. No confluent consolidative airspace disease.
No pleural effusions. No pneumothorax. Prominence of the central
pulmonary vasculature, without frank pulmonary edema. Mild
cardiomegaly. Upper mediastinal contours are within normal limits
allowing for patient rotation to the left.
IMPRESSION: 1. Cardiomegaly with pulmonary venous congestion, but no frank
pulmonary edema.

## 2021-06-08 MED ORDER — ONDANSETRON HCL 4 MG/2ML IJ SOLN
4.0000 mg | INTRAMUSCULAR | Status: DC | PRN
  Administered 2021-06-08: 04:00:00 4 mg via INTRAVENOUS
  Filled 2021-06-08: qty 2

## 2021-06-08 MED ORDER — HYDROMORPHONE HCL 2 MG/ML IJ SOLN
2.0000 mg | INTRAMUSCULAR | Status: AC
  Administered 2021-06-08: 04:00:00 2 mg via INTRAVENOUS
  Filled 2021-06-08: qty 1

## 2021-06-08 MED ORDER — ACETAMINOPHEN 325 MG PO TABS
650.0000 mg | ORAL_TABLET | Freq: Once | ORAL | Status: AC
  Administered 2021-06-08: 06:00:00 650 mg via ORAL
  Filled 2021-06-08: qty 2

## 2021-06-08 MED ORDER — DIPHENHYDRAMINE HCL 50 MG/ML IJ SOLN
25.0000 mg | Freq: Once | INTRAMUSCULAR | Status: AC
  Administered 2021-06-08: 04:00:00 25 mg via INTRAVENOUS
  Filled 2021-06-08: qty 1

## 2021-06-08 MED ORDER — SODIUM CHLORIDE 0.45 % IV BOLUS
500.0000 mL | Freq: Once | INTRAVENOUS | Status: AC
  Administered 2021-06-08: 05:00:00 500 mL via INTRAVENOUS

## 2021-06-08 MED ORDER — IBUPROFEN 800 MG PO TABS
800.0000 mg | ORAL_TABLET | Freq: Once | ORAL | Status: AC
  Administered 2021-06-08: 08:00:00 800 mg via ORAL
  Filled 2021-06-08: qty 1

## 2021-06-08 MED ORDER — SODIUM CHLORIDE 0.45 % IV SOLN: INTRAVENOUS | Status: DC

## 2021-06-08 MED ORDER — HYDROMORPHONE HCL 2 MG/ML IJ SOLN
2.0000 mg | INTRAMUSCULAR | Status: AC
  Administered 2021-06-08: 05:00:00 2 mg via INTRAVENOUS
  Filled 2021-06-08: qty 1

## 2021-06-08 MED ORDER — KETOROLAC TROMETHAMINE 15 MG/ML IJ SOLN
15.0000 mg | INTRAMUSCULAR | Status: AC
  Administered 2021-06-08: 04:00:00 15 mg via INTRAVENOUS
  Filled 2021-06-08: qty 1

## 2021-06-08 NOTE — ED Provider Notes (Signed)
[facility] DEPT Provider Note   CSN: [id number] Arrival date & time: 06/08/21  0210     History Chief Complaint  Patient presents with   Sickle Cell Pain Crisis    Michael Mullins is a 40 y.o. male.  HPI  40 year old male with a history of diabetes, sickle cell anemia, who presents to the emergency department today for evaluation of multiple complaints.  Patient was recently admitted for healthcare associated pneumonia.  He was discharged on 2 L O2.  States that for the last day or 2 he has been having intermittent episodes of near syncope/lightheadedness.  It he also has some diaphoresis with these episodes.  States that he is further complaining of some pain to the left lower extremity that he attributes to his sickle cell pain.  He denies any chest pain or other complaints at this time. Has had some decreased po intake since being home  Past Medical History:  Diagnosis Date   Diabetes mellitus without complication ([place])    Sickle cell anemia (HCC)     Patient Active Problem List   Diagnosis Date Noted   Sickle cell crisis ([place]) 05/30/2021   Acute respiratory failure with hypoxia (HCC) 05/26/2021   SOB (shortness of breath) 05/26/2021   HCAP (healthcare-associated pneumonia) 05/26/2021   Severe sepsis ([place]) 05/26/2021   Left ankle pain 05/26/2021   Pulmonary nodule 05/26/2021   Diabetes mellitus without complication ([place])    Sickle cell pain crisis ([place]) 01/23/2021   Ankle ulcer ([place]) 01/23/2021   Controlled type 2 diabetes mellitus with hyperglycemia ([place]) 01/23/2021   FOOT ULCER 01/14/2009   CHEST PAIN 01/14/2009    History reviewed. No pertinent surgical history.     History reviewed. No pertinent family history.  Social History   Tobacco Use   Smoking status: Never   Smokeless tobacco: Never  Substance Use Topics   Alcohol use: Not Currently   Drug use: Not Currently    Home Medications Prior to Admission medications    Medication Sig Start Date End Date Taking? Authorizing Provider  ACCUTANE 40 MG capsule Take 40 mg by mouth daily. 04/30/21   [provider]  amoxicillin-clavulanate (AUGMENTIN) 875-125 MG tablet Take 1 tablet by mouth 2 (two) times daily for 7 days. 05/28/21 06/08/21  Tresa Garter, MD  collagenase (SANTYL) ointment Apply to the affected area topically daily. Patient not taking: Reported on 05/25/2021 01/26/21   Dorena Dew, FNP  empagliflozin (JARDIANCE) 10 MG TABS tablet Take 1 tablet (10 mg total) by mouth daily. 11/03/20     ergocalciferol (VITAMIN D2) 1.25 MG (50000 UT) capsule Take 1 capsule by mouth every 30 days Patient not taking: Reported on 05/25/2021 08/07/20     ferrous sulfate (FEROSUL) 325 (65 FE) MG tablet Take by mouth. Patient not taking: Reported on [id number]     folic acid (FOLVITE) 1 MG tablet Take 1 tablet by mouth daily Patient taking differently: Take 1 mg by mouth daily. 08/07/20     guaiFENesin-dextromethorphan (ROBITUSSIN DM) 100-10 MG/5ML syrup Take 10 mLs by mouth every 4 (four) hours as needed for cough. 05/28/21   Tresa Garter, MD  lisinopril (ZESTRIL) 2.5 MG tablet Take 1 tablet (2.5 mg total) by mouth daily. 11/03/20     methadone (DOLOPHINE) 10 MG tablet Take 3 tablets by mouth 3 times daily. Patient taking differently: Take 10 mg by mouth every 8 (eight) hours. 05/24/21     naloxone (NARCAN) nasal spray 4 mg/0.1  mL Use as directed every 2-3 minutes until emergency arrives, call 911 Patient not taking: Reported on 05/25/2021 08/07/20     ondansetron (ZOFRAN) 4 MG tablet Take 1 tablet (4 mg total) by mouth every 8 (eight) hours as needed for Nausea / Vomiting. 02/22/21     OXBRYTA 500 MG TABS tablet Take 1,500 mg by mouth daily. 01/01/21   [provider]  oxycodone (ROXICODONE) 30 MG immediate release tablet Take 1 tablet by mouth every 6 hours as needed for Pain. Patient taking differently: Take 30 mg by mouth every 6  (six) hours as needed for pain. 05/24/21     triamcinolone cream (KENALOG) 0.1 % Apply to affected skin of arms twice daily as needed. NEVER to face/neck/groin. Patient not taking: Reported on 01/19/2021 11/28/20     Vitamin D, Ergocalciferol, (DRISDOL) 1.25 MG (50000 UNIT) CAPS capsule Take 1 capsule by mouth every 30 days Patient taking differently: Take 50,000 Units by mouth every 30 (thirty) days. 05/24/21       Allergies    Morphine and Vancomycin  Review of Systems   Review of Systems  Constitutional:  Negative for chills and fever.  HENT:  Negative for ear pain and sore throat.   Eyes:  Negative for visual disturbance.  Respiratory:  Negative for cough and shortness of breath.   Cardiovascular:  Negative for chest pain.  Gastrointestinal:  Negative for abdominal pain, constipation, diarrhea, nausea and vomiting.  Genitourinary:  Negative for dysuria and hematuria.  Musculoskeletal:  Negative for back pain.  Skin:  Negative for rash.  Neurological:  Positive for dizziness and light-headedness. Negative for syncope.  All other systems reviewed and are negative.  Physical Exam Updated Vital Signs BP 110/64    Pulse (!) 52    Temp 97.9 F (36.6 C) (Oral)    Resp 13    Ht 5\' 11"  (1.803 m)    Wt 95.3 kg    SpO2 100%    BMI 29.29 kg/m   Physical Exam Vitals and nursing note reviewed.  Constitutional:      General: He is not in acute distress.    Appearance: He is well-developed.  HENT:     Head: Normocephalic and atraumatic.  Eyes:     Conjunctiva/sclera: Conjunctivae normal.  Cardiovascular:     Rate and Rhythm: Normal rate and regular rhythm.     Heart sounds: Normal heart sounds. No murmur heard. Pulmonary:     Effort: Pulmonary effort is normal. No respiratory distress.     Breath sounds: Normal breath sounds. No wheezing, rhonchi or rales.  Abdominal:     General: Bowel sounds are normal.     Palpations: Abdomen is soft.     Tenderness: There is no abdominal  tenderness. There is no guarding or rebound.  Musculoskeletal:        General: No swelling.     Cervical back: Neck supple.  Skin:    General: Skin is warm and dry.     Capillary Refill: Capillary refill takes less than 2 seconds.  Neurological:     Mental Status: He is alert.  Psychiatric:        Mood and Affect: Mood normal.    ED Results / Procedures / Treatments   Labs (all labs ordered are listed, but only abnormal results are displayed) Labs Reviewed  CBC WITH DIFFERENTIAL/PLATELET - Abnormal; Notable for the following components:      Result Value   RBC 2.87 (*)    Hemoglobin  9.5 (*)    HCT 26.7 (*)    RDW 23.9 (*)    nRBC 8.2 (*)    Monocytes Absolute 1.8 (*)    All other components within normal limits  COMPREHENSIVE METABOLIC PANEL - Abnormal; Notable for the following components:   Glucose, Bld 109 (*)    AST 47 (*)    Total Bilirubin 2.3 (*)    All other components within normal limits  RETICULOCYTES - Abnormal; Notable for the following components:   Retic Ct Pct 15.1 (*)    RBC. 2.90 (*)    Retic Count, Absolute 437.3 (*)    Immature Retic Fract 43.1 (*)    All other components within normal limits  TROPONIN I (HIGH SENSITIVITY)  TROPONIN I (HIGH SENSITIVITY)    EKG EKG Interpretation  Date/Time:  Friday June 08 2021 03:44:13 EST Ventricular Rate:  61 PR Interval:  149 QRS Duration: 106 QT Interval:  460 QTC Calculation: 464 R Axis:   43 Text Interpretation: Sinus rhythm Consider left atrial enlargement RSR' in V1 or V2, probably normal variant Similar to prior tracing in Dec 2022 Confirmed by Nanda Quinton [phone]) on 06/08/2021 3:49:41 AM  Radiology DG Chest 2 View  Result Date: 06/08/2021 CLINICAL DATA:  40 year old male with history of weakness. Sickle cell disease. EXAM: CHEST - 2 VIEW COMPARISON:  Chest x-ray 05/30/2021. FINDINGS: Lung volumes are low. No confluent consolidative airspace disease. No pleural effusions. No pneumothorax.  Prominence of the central pulmonary vasculature, without frank pulmonary edema. Mild cardiomegaly. Upper mediastinal contours are within normal limits allowing for patient rotation to the left. IMPRESSION: 1. Cardiomegaly with pulmonary venous congestion, but no frank pulmonary edema. Electronically Signed   By: Vinnie Langton M.D.   On: 06/08/2021 05:18   CT Head Wo Contrast  Result Date: 06/08/2021 CLINICAL DATA:  40 year old male with history of new or worsening headache with intermittent weakness and dizziness for the past week. EXAM: CT HEAD WITHOUT CONTRAST TECHNIQUE: Contiguous axial images were obtained from the base of the skull through the vertex without intravenous contrast. COMPARISON:  No priors. FINDINGS: Brain: No evidence of acute infarction, hemorrhage, hydrocephalus, extra-axial collection or mass lesion/mass effect. Vascular: No hyperdense vessel or unexpected calcification. Skull: Normal. Negative for fracture or focal lesion. Sinuses/Orbits: No acute finding. Other: None. IMPRESSION: 1. No acute intracranial abnormalities. The appearance of the brain is normal. Electronically Signed   By: Vinnie Langton M.D.   On: 06/08/2021 05:47    Procedures Procedures   Medications Ordered in ED Medications  ondansetron (ZOFRAN) injection 4 mg (4 mg Intravenous Given 06/08/21 0356)  0.45 % sodium chloride infusion ( Intravenous New Bag/Given 06/08/21 0454)  ketorolac (TORADOL) 15 MG/ML injection 15 mg (15 mg Intravenous Given 06/08/21 0400)  HYDROmorphone (DILAUDID) injection 2 mg (2 mg Intravenous Given 06/08/21 0401)  HYDROmorphone (DILAUDID) injection 2 mg (2 mg Intravenous Given 06/08/21 0431)  diphenhydrAMINE (BENADRYL) injection 25 mg (25 mg Intravenous Given 06/08/21 0358)  sodium chloride 0.45 % bolus 500 mL (0 mLs Intravenous Stopped 06/08/21 0518)  acetaminophen (TYLENOL) tablet 650 mg (650 mg Oral Given 06/08/21 [id number])    ED Course  I have reviewed the triage vital signs  and the nursing notes.  Pertinent labs & imaging results that were available during my care of the patient were reviewed by me and considered in my medical decision making (see chart for details).    MDM Rules/Calculators/A&P  40 y/o m presenting for eval of near syncopal episodes and sickle cell pain.   Reviewed/interpreted labs CBC w/o leukocytosis, anemia present and improved from prior CMP grossly unremarkable Trop neg  EKG grossly unremarkable  Cxr -  1. Cardiomegaly with pulmonary venous congestion, but no frank pulmonary edema.  CT head -  1. No acute intracranial abnormalities. The appearance of the brain is normal.  Pt was given ivf, pain meds. On reassessment he is c/o headache and feeling lightheaded. He remains neurointact and is ambulatory without ataxia. Doubt stroke or emergent neurologic cause. Will order tylenol and reassess.  6:43 PM CONSULT with Leanna Battles, PA-C to recheck pt after pain meds. If pt feeling better, he is likely appropriate for d/c  Final Clinical Impression(s) / ED Diagnoses Final diagnoses:  Sickle cell pain crisis ([place])  Nonintractable headache, unspecified chronicity pattern, unspecified headache type    Rx / DC Orders ED Discharge Orders          Ordered    Ambulatory referral to Neurology       Comments: An appointment is requested in approximately: 2 weeks   06/08/21 0620             Rodney Booze, PA-C 06/08/21 [id number]    Margette Fast, MD 06/17/21 [phone]

## 2021-06-08 NOTE — Patient Outreach (Signed)
[facility]) Care Management  06/08/2021  Michael Mullins May 20, 1981 [id number]  Telephone Assessment-Unsuccessful  RN attempted outreach today to complete the initial assessment however unsuccessful. RN was not able to complete this task or leave a HIPAA approved voice message.  Will scheduled another outreach call over the next week for ongoing Transition of care follow.  Elliot Cousin, RN Care Management Coordinator [facility] Main Office [phone]

## 2021-06-08 NOTE — ED Provider Notes (Signed)
Care assumed from Courtni Couture at shift change, please see their note for full detail, but in brief Michael Mullins is a 40 y.o. male presents with headache and sickle cell crisis.   Physical Exam  BP (!) 98/57    Pulse (!) 52    Temp 97.9 F (36.6 C) (Oral)    Resp 14    Ht 5\' 11"  (1.803 m)    Wt 95.3 kg    SpO2 98%    BMI 29.29 kg/m   Physical Exam Vitals and nursing note reviewed.  Constitutional:      General: He is not in acute distress.    Appearance: He is not ill-appearing.  HENT:     Head: Atraumatic.  Eyes:     Conjunctiva/sclera: Conjunctivae normal.  Cardiovascular:     Rate and Rhythm: Normal rate and regular rhythm.     Pulses: Normal pulses.     Heart sounds: No murmur heard. Pulmonary:     Effort: Pulmonary effort is normal. No respiratory distress.     Breath sounds: Normal breath sounds.  Abdominal:     General: Abdomen is flat. There is no distension.     Palpations: Abdomen is soft.     Tenderness: There is no abdominal tenderness.  Musculoskeletal:        General: Normal range of motion.     Cervical back: Normal range of motion.  Skin:    General: Skin is warm and dry.     Capillary Refill: Capillary refill takes less than 2 seconds.  Neurological:     General: No focal deficit present.     Mental Status: He is alert.  Psychiatric:        Mood and Affect: Mood normal.    ED Course/Procedures     Procedures  MDM  Plan: Sickle cell pain managed at time of handoff. He continued to have headache and was given Tylenol. On reassessment, patient was feeling better. Discharged home with neurology follow up       06/08/21 06/10/21    [id number], MD 06/08/21 [phone]

## 2021-06-08 NOTE — Discharge Instructions (Addendum)
You were given a referral to neurology. They should be calling to set up an appointment for you to be seen as an outpatient.  Please return to the emergency department for any new or worsening symptoms.

## 2021-06-08 NOTE — ED Triage Notes (Signed)
Patient reports with complaint of Sickle Cell pain crisis. Pt report he has been having dizzy spells with weakness lasting 2-3 minutes x 1 week. Pt states he was placed on oxygen after his last admission and has not been able to take it off. Pt also reporting BLE pain.

## 2021-06-08 NOTE — ED Provider Notes (Signed)
Emergency Medicine Provider Triage Evaluation Note  Michael Mullins , a 40 y.o. male  was evaluated in triage.  Pt complains of weakness, near syncope/lightheadedness. He has associated diaphoresis with this as well. Recently admitted for pna, d/c on augmentin on 12/25  He further c/o left leg pain consistent with his prior sickle cell pain. Denies chest pain  Review of Systems  Positive: Near syncope/lightheadedness, weakness, leg pain Negative: Chest pain  Physical Exam  BP (!) 142/74 (BP Location: Left Arm)    Pulse 63    Temp 98.2 F (36.8 C) (Oral)    Resp 16    Ht 5\' 11"  (1.803 m)    Wt 95.3 kg    SpO2 99%    BMI 29.29 kg/m  Gen:   Awake, no distress   Resp:  Normal effort  MSK:   Moves extremities without difficulty  Other:  Speaking in full sentences, heart rrr, lungs ctab  Medical Decision Making  Medically screening exam initiated at 2:53 AM.  Appropriate orders placed.  MINORU CHAP was informed that the remainder of the evaluation will be completed by another provider, this initial triage assessment does not replace that evaluation, and the importance of remaining in the ED until their evaluation is complete.     Harvie Bridge, Karrie Meres 06/08/21 06/10/21    [id number], MD 06/08/21 [phone]

## 2021-06-14 DIAGNOSIS — D571 Sickle-cell disease without crisis: Secondary | ICD-10-CM | POA: Diagnosis not present

## 2021-06-14 DIAGNOSIS — R42 Dizziness and giddiness: Secondary | ICD-10-CM | POA: Diagnosis not present

## 2021-06-14 DIAGNOSIS — Z8701 Personal history of pneumonia (recurrent): Secondary | ICD-10-CM | POA: Diagnosis not present

## 2021-06-14 DIAGNOSIS — R519 Headache, unspecified: Secondary | ICD-10-CM | POA: Diagnosis not present

## 2021-06-15 ENCOUNTER — Encounter: Payer: Self-pay | Admitting: *Deleted

## 2021-06-15 ENCOUNTER — Other Ambulatory Visit: Payer: Self-pay | Admitting: *Deleted

## 2021-06-15 NOTE — Patient Outreach (Signed)
[facility]) Care Management  [facility] Care Manager  06/15/2021   Michael Mullins 1981-01-22 [id number]  Telephone Assessment-Successful  Spoke with pt today who is doing much better. Pt states he is no longer seeking care via Sickle Cell clinics in [place] or [place] instead states his primary provider and hematologist will be monitoring his Sickle Cell. Adherent to all medications and appointments with the request for monthly follow up calls at this time for ongoing interventions. Plan updated accordingly.  Will follow up next month as requested.   Encounter Medications:  Outpatient Encounter Medications as of 06/15/2021  Medication Sig   ACCUTANE 40 MG capsule Take 40 mg by mouth daily.   empagliflozin (JARDIANCE) 10 MG TABS tablet Take 1 tablet (10 mg total) by mouth daily.   ergocalciferol (VITAMIN D2) 1.25 MG (50000 UT) capsule Take 1 capsule by mouth every 30 days   ferrous sulfate (FEROSUL) 325 (65 FE) MG tablet Take by mouth.   folic acid (FOLVITE) 1 MG tablet Take 1 tablet by mouth daily (Patient taking differently: Take 1 mg by mouth daily.)   guaiFENesin-dextromethorphan (ROBITUSSIN DM) 100-10 MG/5ML syrup Take 10 mLs by mouth every 4 (four) hours as needed for cough.   lisinopril (ZESTRIL) 2.5 MG tablet Take 1 tablet (2.5 mg total) by mouth daily.   methadone (DOLOPHINE) 10 MG tablet Take 3 tablets by mouth 3 times daily. (Patient taking differently: Take 10 mg by mouth every 8 (eight) hours.)   OXBRYTA 500 MG TABS tablet Take 1,500 mg by mouth daily.   oxycodone (ROXICODONE) 30 MG immediate release tablet Take 1 tablet by mouth every 6 hours as needed for Pain. (Patient taking differently: Take 30 mg by mouth every 6 (six) hours as needed for pain.)   triamcinolone cream (KENALOG) 0.1 % Apply to affected skin of arms twice daily as needed. NEVER to face/neck/groin.   Vitamin D, Ergocalciferol, (DRISDOL) 1.25 MG (50000 UNIT) CAPS capsule Take 1 capsule by mouth  every 30 days (Patient taking differently: Take 50,000 Units by mouth every 30 (thirty) days.)   collagenase (SANTYL) ointment Apply to the affected area topically daily. (Patient not taking: Reported on 06/15/2021)   naloxone [facility]) nasal spray 4 mg/0.1 mL Use as directed every 2-3 minutes until emergency arrives, call 911 (Patient not taking: Reported on 05/25/2021)   ondansetron (ZOFRAN) 4 MG tablet Take 1 tablet (4 mg total) by mouth every 8 (eight) hours as needed for Nausea / Vomiting.   No facility-administered encounter medications on file as of 06/15/2021.    Functional Status:  In your present state of health, do you have any difficulty performing the following activities: 06/15/2021 05/30/2021  Hearing? N N  Vision? N N  Difficulty concentrating or making decisions? N N  Walking or climbing stairs? N N  Comment - -  Dressing or bathing? N N  Doing errands, shopping? N N  Preparing Food and eating ? N -  Using the Toilet? N -  In the past six months, have you accidently leaked urine? N -  Do you have problems with loss of bowel control? N -  Managing your Medications? N -  Managing your Finances? N -  Housekeeping or managing your Housekeeping? N -  Some recent data might be hidden    Fall/Depression Screening: Fall Risk  06/15/2021  Falls in the past year? 0   PHQ 2/9 Scores 06/15/2021  PHQ - 2 Score 0    Assessment:   Care Plan  Care Plan : RN Care Manger  Updates made by Alejandro Mulling, RN since 06/15/2021 12:00 AM     Problem: Knowledge deficit related to Sickle Cell Disease and care coordination needs   Priority: High  Onset Date: 06/06/2021     Long-Range Goal: Development Plan of care for management of Sickle Cell   Start Date: 06/06/2021  Expected End Date: 09/07/2021  This Visit's Progress: On track  Recent Progress: On track  Priority: High  Note:   Current Barriers:  Knowledge Deficits related to plan of care for management of Sickle Cell symptom.    RNCM Clinical Goal(s):  Patient will verbalize basic understanding of  sickle cell crises  disease process and self management plan as evidence by reduced symptoms through collaboration with RN Care manager, provider, and care team.   Interventions: Inter-disciplinary care team collaboration (see longitudinal plan of care) Evaluation of current treatment plan related to  self management and patient's adherence to plan as established by provider  Current Barriers: Sickle Cell  (Status:  Goal on track:  Yes.)  Long Term Goal Discussed plans with patient for ongoing care management follow up and provided patient with direct contact information for care management team Evaluation of current treatment plan related to Sickle Cell and patient's adherence to plan as established by provider  Update 1/5: Pt doing much better and indicated he did not establish services with the Sickle cell clinics in [place] or [place] instead pt's primary care provider Dr. Wendi Mullins and hematologist Dr. Joseph Mullins will be treating and monitoring his ongoing issues related to his Sickle Cell. Pt has request to monthly follow up appointments now that he has some relief from his crises. Initial assessment now completed and pt has all his prescribed medications and taking accordingly.  Patient Goals/Self-Care Activities: Take all medications as prescribed Attend all scheduled provider appointments Call pharmacy for medication refills 3-7 days in advance of running out of medications Perform all self care activities independently  Call provider office for new concerns or questions   Health Maintenance Interventions: Follow Up Plan:  Telephone follow up appointment with care management team member scheduled for:  Feb 2023 The patient has been provided with contact information for the care management team and has been advised to call with any health related questions or concerns.  Knowledge Deficits related to plan of care for  management of Sickle Cell symptoms       Michael Cousin, RN Care Management Coordinator [facility] Main Office [phone]

## 2021-06-15 NOTE — Patient Instructions (Addendum)
Visit Information  Thank you for taking time to visit with me today. Please don't hesitate to contact me if I can be of assistance to you before our next scheduled telephone appointment.  Following are the goals we discussed today:  Take all medications as prescribed Attend all scheduled provider appointments Call pharmacy for medication refills 3-7 days in advance of running out of medications Perform all self care activities independently  Call provider office for new concerns or questions

## 2021-06-27 ENCOUNTER — Other Ambulatory Visit (HOSPITAL_BASED_OUTPATIENT_CLINIC_OR_DEPARTMENT_OTHER): Payer: Self-pay

## 2021-06-27 MED ORDER — METHADONE HCL 10 MG PO TABS
ORAL_TABLET | ORAL | 0 refills | Status: DC
  Filled 2021-06-27: qty 270, 30d supply, fill #0
  Filled 2021-06-29: qty 39, 10d supply, fill #0
  Filled 2021-06-29: qty 231, 20d supply, fill #0

## 2021-06-27 MED ORDER — OXYCODONE HCL 30 MG PO TABS
30.0000 mg | ORAL_TABLET | Freq: Four times a day (QID) | ORAL | 0 refills | Status: DC | PRN
  Filled 2021-06-27: qty 120, 30d supply, fill #0
  Filled 2021-06-29: qty 80, 20d supply, fill #0
  Filled 2021-06-29: qty 40, 10d supply, fill #0

## 2021-06-29 ENCOUNTER — Other Ambulatory Visit (HOSPITAL_BASED_OUTPATIENT_CLINIC_OR_DEPARTMENT_OTHER): Payer: Self-pay

## 2021-07-02 ENCOUNTER — Other Ambulatory Visit (HOSPITAL_BASED_OUTPATIENT_CLINIC_OR_DEPARTMENT_OTHER): Payer: Self-pay

## 2021-07-13 ENCOUNTER — Other Ambulatory Visit: Payer: Self-pay | Admitting: *Deleted

## 2021-07-13 NOTE — Patient Outreach (Signed)
[facility]) Care Management  07/13/2021  RHYDIAN ZEPP 1980/07/20 [id number]   Telephone Assessment-Unsuccessful  RN attempted outreach call today however unsuccessful. RN unable to leave a HIPAA voice message.  Will attempt another outreach call over the next week for ongoing [facility] services.  Raina Mina, RN Care Management Coordinator [place] Office [phone]

## 2021-07-19 ENCOUNTER — Other Ambulatory Visit: Payer: Self-pay | Admitting: *Deleted

## 2021-07-19 ENCOUNTER — Other Ambulatory Visit (HOSPITAL_COMMUNITY): Payer: Self-pay

## 2021-07-19 NOTE — Patient Outreach (Signed)
[facility]) Care Management Telephonic RN Care Manager Note   07/19/2021 Name:  Michael Mullins MRN:  [id number] DOB:  Jun 24, 1980   Recommendations/Changes made from today's visit: Case closure with all goals met.  Subjective: Michael Mullins is an 41 y.o. year old male who is a primary patient of Coy Saunas, Myrna Blazer, MD. The care management team was consulted for assistance with care management and/or care coordination needs.    Telephonic RN Care Manager completed Telephone Visit today.  Objective:   Medications Reviewed Today     Reviewed by Tobi Bastos, RN (Registered Nurse) on 06/15/21 at 1522  Med List Status: <None>   Medication Order Taking? Sig Documenting Provider Last Dose Status Informant  ACCUTANE 40 MG capsule [id number] Yes Take 40 mg by mouth daily. [provider] Taking Active Self  collagenase (SANTYL) ointment [id number] No Apply to the affected area topically daily.  Patient not taking: Reported on 06/15/2021   Dorena Dew, FNP Not Taking Active Self  empagliflozin (JARDIANCE) 10 MG TABS tablet [id number] Yes Take 1 tablet (10 mg total) by mouth daily.  Taking Active Self  ergocalciferol (VITAMIN D2) 1.25 MG (50000 UT) capsule [id number] Yes Take 1 capsule by mouth every 30 days  Taking Active Self  ferrous sulfate (FEROSUL) 325 (65 FE) MG tablet [id number] Yes Take by mouth.  Taking Active Self  folic acid (FOLVITE) 1 MG tablet [id number] Yes Take 1 tablet by mouth daily  Patient taking differently: Take 1 mg by mouth daily.    Taking Active Self  guaiFENesin-dextromethorphan (ROBITUSSIN DM) 100-10 MG/5ML syrup [id number] Yes Take 10 mLs by mouth every 4 (four) hours as needed for cough. Tresa Garter, MD Taking Active Self  lisinopril (ZESTRIL) 2.5 MG tablet [id number] Yes Take 1 tablet (2.5 mg total) by mouth daily.  Taking Active Self  methadone (DOLOPHINE) 10 MG tablet [id number] Yes Take 3 tablets by mouth 3 times daily.   Patient taking differently: Take 10 mg by mouth every 8 (eight) hours.    Taking Active Self  naloxone (NARCAN) nasal spray 4 mg/0.1 mL [id number] No Use as directed every 2-3 minutes until emergency arrives, call 911  Patient not taking: Reported on 05/25/2021    Not Taking Active Self  ondansetron (ZOFRAN) 4 MG tablet [id number]  Take 1 tablet (4 mg total) by mouth every 8 (eight) hours as needed for Nausea / Vomiting.   Active Self  OXBRYTA 500 MG TABS tablet [id number] Yes Take 1,500 mg by mouth daily. [provider] Taking Active Self  oxycodone (ROXICODONE) 30 MG immediate release tablet [id number] Yes Take 1 tablet by mouth every 6 hours as needed for Pain.  Patient taking differently: Take 30 mg by mouth every 6 (six) hours as needed for pain.    Taking Active Self  triamcinolone cream (KENALOG) 0.1 % [id number] Yes Apply to affected skin of arms twice daily as needed. NEVER to face/neck/groin.  Taking Active Self  Vitamin D, Ergocalciferol, (DRISDOL) 1.25 MG (50000 UNIT) CAPS capsule [id number] Yes Take 1 capsule by mouth every 30 days  Patient taking differently: Take 50,000 Units by mouth every 30 (thirty) days.    Taking Active Self             SDOH:  (Social Determinants of Health) assessments and interventions performed:     Care Plan  Review of patient past medical history, allergies, medications, health status, including review of consultants reports, laboratory and other test data, was  performed as part of comprehensive evaluation for care management services.   Care Plan : RN Care Manger  Updates made by Tobi Bastos, RN since 07/19/2021 12:00 AM     Problem: Knowledge deficit related to Sickle Cell Disease and care coordination needs Resolved 07/19/2021  Priority: High  Onset Date: 06/06/2021     Long-Range Goal: Development Plan of care for management of Sickle Cell Completed 07/19/2021  Start Date: 06/06/2021  Expected End Date: 09/07/2021  Recent  Progress: On track  Priority: High  Note:   Current Barriers:  Knowledge Deficits related to plan of care for management of Sickle Cell symptom.   RNCM Clinical Goal(s):  Patient will verbalize basic understanding of  sickle cell crises  disease process and self management plan as evidence by reduced symptoms through collaboration with RN Care manager, provider, and care team.   Interventions: Inter-disciplinary care team collaboration (see longitudinal plan of care) Evaluation of current treatment plan related to  self management and patient's adherence to plan as established by provider  Current Barriers: Sickle Cell  (Status:  Goal on track:  Yes.)  Long Term Goal Discussed plans with patient for ongoing care management follow up and provided patient with direct contact information for care management team Evaluation of current treatment plan related to Sickle Cell and patient's adherence to plan as established by provider  Update 1/5: Pt doing much better and indicated he did not establish services with the Sickle cell clinics in [place] or [place] instead pt's primary care provider Dr. Coy Saunas and hematologist Dr. Sherral Hammers will be treating and monitoring his ongoing issues related to his Sickle Cell. Pt has request to monthly follow up appointments now that he has some relief from his crises. Initial assessment now completed and pt has all his prescribed medications and taking accordingly.  Patient Goals/Self-Care Activities: Take all medications as prescribed Attend all scheduled provider appointments Call pharmacy for medication refills 3-7 days in advance of running out of medications Perform all self care activities independently  Call provider office for new concerns or questions   Health Maintenance Interventions: Follow Up Plan:  Telephone follow up appointment with care management team member scheduled for:  Feb 2023 The patient has been provided with contact information for  the care management team and has been advised to call with any health related questions or concerns.  Knowledge Deficits related to plan of care for management of Sickle Cell symptoms        Plan:  No further follow up required: Needs met no further needs.  Raina Mina, RN Care Management Coordinator [place] Office [phone]

## 2021-07-31 ENCOUNTER — Other Ambulatory Visit (HOSPITAL_COMMUNITY): Payer: Self-pay

## 2021-07-31 MED ORDER — OXYCODONE HCL 30 MG PO TABS
30.0000 mg | ORAL_TABLET | Freq: Four times a day (QID) | ORAL | 0 refills | Status: DC | PRN
Start: 2021-07-31 — End: 2021-08-27
  Filled 2021-07-31 (×2): qty 120, 30d supply, fill #0

## 2021-07-31 MED ORDER — METHADONE HCL 10 MG PO TABS
30.0000 mg | ORAL_TABLET | Freq: Three times a day (TID) | ORAL | 0 refills | Status: DC
  Filled 2021-07-31 (×2): qty 270, 30d supply, fill #0

## 2021-08-01 ENCOUNTER — Other Ambulatory Visit (HOSPITAL_COMMUNITY): Payer: Self-pay

## 2021-08-03 ENCOUNTER — Other Ambulatory Visit (HOSPITAL_BASED_OUTPATIENT_CLINIC_OR_DEPARTMENT_OTHER): Payer: Self-pay

## 2021-08-22 DIAGNOSIS — M7918 Myalgia, other site: Secondary | ICD-10-CM | POA: Diagnosis not present

## 2021-08-22 DIAGNOSIS — D571 Sickle-cell disease without crisis: Secondary | ICD-10-CM | POA: Diagnosis not present

## 2021-08-22 DIAGNOSIS — E559 Vitamin D deficiency, unspecified: Secondary | ICD-10-CM | POA: Diagnosis not present

## 2021-08-27 ENCOUNTER — Other Ambulatory Visit (HOSPITAL_COMMUNITY): Payer: Self-pay

## 2021-08-27 MED ORDER — OXYCODONE HCL 30 MG PO TABS
30.0000 mg | ORAL_TABLET | Freq: Four times a day (QID) | ORAL | 0 refills | Status: DC | PRN
  Filled 2021-08-27 – 2021-08-29 (×2): qty 120, 30d supply, fill #0

## 2021-08-27 MED ORDER — METHADONE HCL 10 MG PO TABS
30.0000 mg | ORAL_TABLET | Freq: Three times a day (TID) | ORAL | 0 refills | Status: DC
  Filled 2021-08-27: qty 30, 4d supply, fill #0
  Filled 2021-08-29: qty 270, 30d supply, fill #0

## 2021-08-29 ENCOUNTER — Other Ambulatory Visit (HOSPITAL_COMMUNITY): Payer: Self-pay

## 2021-09-26 ENCOUNTER — Emergency Department (HOSPITAL_BASED_OUTPATIENT_CLINIC_OR_DEPARTMENT_OTHER): Payer: 59

## 2021-09-26 ENCOUNTER — Emergency Department (HOSPITAL_BASED_OUTPATIENT_CLINIC_OR_DEPARTMENT_OTHER)
Admission: EM | Admit: 2021-09-26 | Discharge: 2021-09-26 | Disposition: A | Discharge status: Home / Self Care | Payer: 59 | Attending: Emergency Medicine | Admitting: Emergency Medicine

## 2021-09-26 ENCOUNTER — Emergency Department (HOSPITAL_BASED_OUTPATIENT_CLINIC_OR_DEPARTMENT_OTHER): Payer: 59 | Admitting: Radiology

## 2021-09-26 ENCOUNTER — Encounter (HOSPITAL_BASED_OUTPATIENT_CLINIC_OR_DEPARTMENT_OTHER): Payer: Self-pay | Admitting: Emergency Medicine

## 2021-09-26 ENCOUNTER — Other Ambulatory Visit: Payer: Self-pay

## 2021-09-26 DIAGNOSIS — E1165 Type 2 diabetes mellitus with hyperglycemia: Secondary | ICD-10-CM | POA: Insufficient documentation

## 2021-09-26 DIAGNOSIS — R42 Dizziness and giddiness: Secondary | ICD-10-CM | POA: Diagnosis not present

## 2021-09-26 DIAGNOSIS — R55 Syncope and collapse: Secondary | ICD-10-CM | POA: Insufficient documentation

## 2021-09-26 DIAGNOSIS — Z862 Personal history of diseases of the blood and blood-forming organs and certain disorders involving the immune mechanism: Secondary | ICD-10-CM

## 2021-09-26 DIAGNOSIS — D571 Sickle-cell disease without crisis: Secondary | ICD-10-CM | POA: Diagnosis not present

## 2021-09-26 DIAGNOSIS — R0789 Other chest pain: Secondary | ICD-10-CM | POA: Diagnosis not present

## 2021-09-26 LAB — COMPREHENSIVE METABOLIC PANEL
ALT: 85 U/L — ABNORMAL HIGH (ref 0–44)
AST: 57 U/L — ABNORMAL HIGH (ref 15–41)
Albumin: 4.3 g/dL (ref 3.5–5.0)
Alkaline Phosphatase: 62 U/L (ref 38–126)
Anion gap: 7 (ref 5–15)
BUN: 5 mg/dL — ABNORMAL LOW (ref 6–20)
CO2: 23 mmol/L (ref 22–32)
Calcium: 9.6 mg/dL (ref 8.9–10.3)
Chloride: 107 mmol/L (ref 98–111)
Creatinine, Ser: 0.68 mg/dL (ref 0.61–1.24)
GFR, Estimated: >60 mL/min (ref >60)
Glucose, Bld: 98 mg/dL (ref 70–99)
Potassium: 3.7 mmol/L (ref 3.5–5.1)
Sodium: 137 mmol/L (ref 135–145)
Total Bilirubin: 4.6 mg/dL — ABNORMAL HIGH (ref 0.3–1.2)
Total Protein: 7.6 g/dL (ref 6.5–8.1)

## 2021-09-26 LAB — CBC
HCT: 26.8 % — ABNORMAL LOW (ref 39.0–52.0)
Hemoglobin: 9.6 g/dL — ABNORMAL LOW (ref 13.0–17.0)
MCH: 30.8 pg (ref 26.0–34.0)
MCHC: 35.8 g/dL (ref 30.0–36.0)
MCV: 85.9 fL (ref 80.0–100.0)
Platelets: 313 10*3/uL (ref 150–400)
RBC: 3.12 MIL/uL — ABNORMAL LOW (ref 4.22–5.81)
RDW: 21.4 % — ABNORMAL HIGH (ref 11.5–15.5)
WBC: 11.6 10*3/uL — ABNORMAL HIGH (ref 4.0–10.5)
nRBC: 0.3 % — ABNORMAL HIGH (ref 0.0–0.2)

## 2021-09-26 LAB — URINALYSIS, ROUTINE W REFLEX MICROSCOPIC
Bilirubin Urine: NEGATIVE
Glucose, UA: NEGATIVE mg/dL
Ketones, ur: NEGATIVE mg/dL
Leukocytes,Ua: NEGATIVE
Nitrite: NEGATIVE
Protein, ur: 30 mg/dL — AB
Specific Gravity, Urine: 1.015 (ref 1.005–1.030)
pH: 6 (ref 5.0–8.0)

## 2021-09-26 LAB — CBG MONITORING, ED: Glucose-Capillary: 97 mg/dL (ref 70–99)

## 2021-09-26 IMAGING — DX DG CHEST 2V
2 series · 2 of 2 positions shown · non-contrast
Comparison: [DATE]

CLINICAL DATA: Chest discomfort, dizziness

EXAM:
CHEST - 2 VIEW

[chest pa]
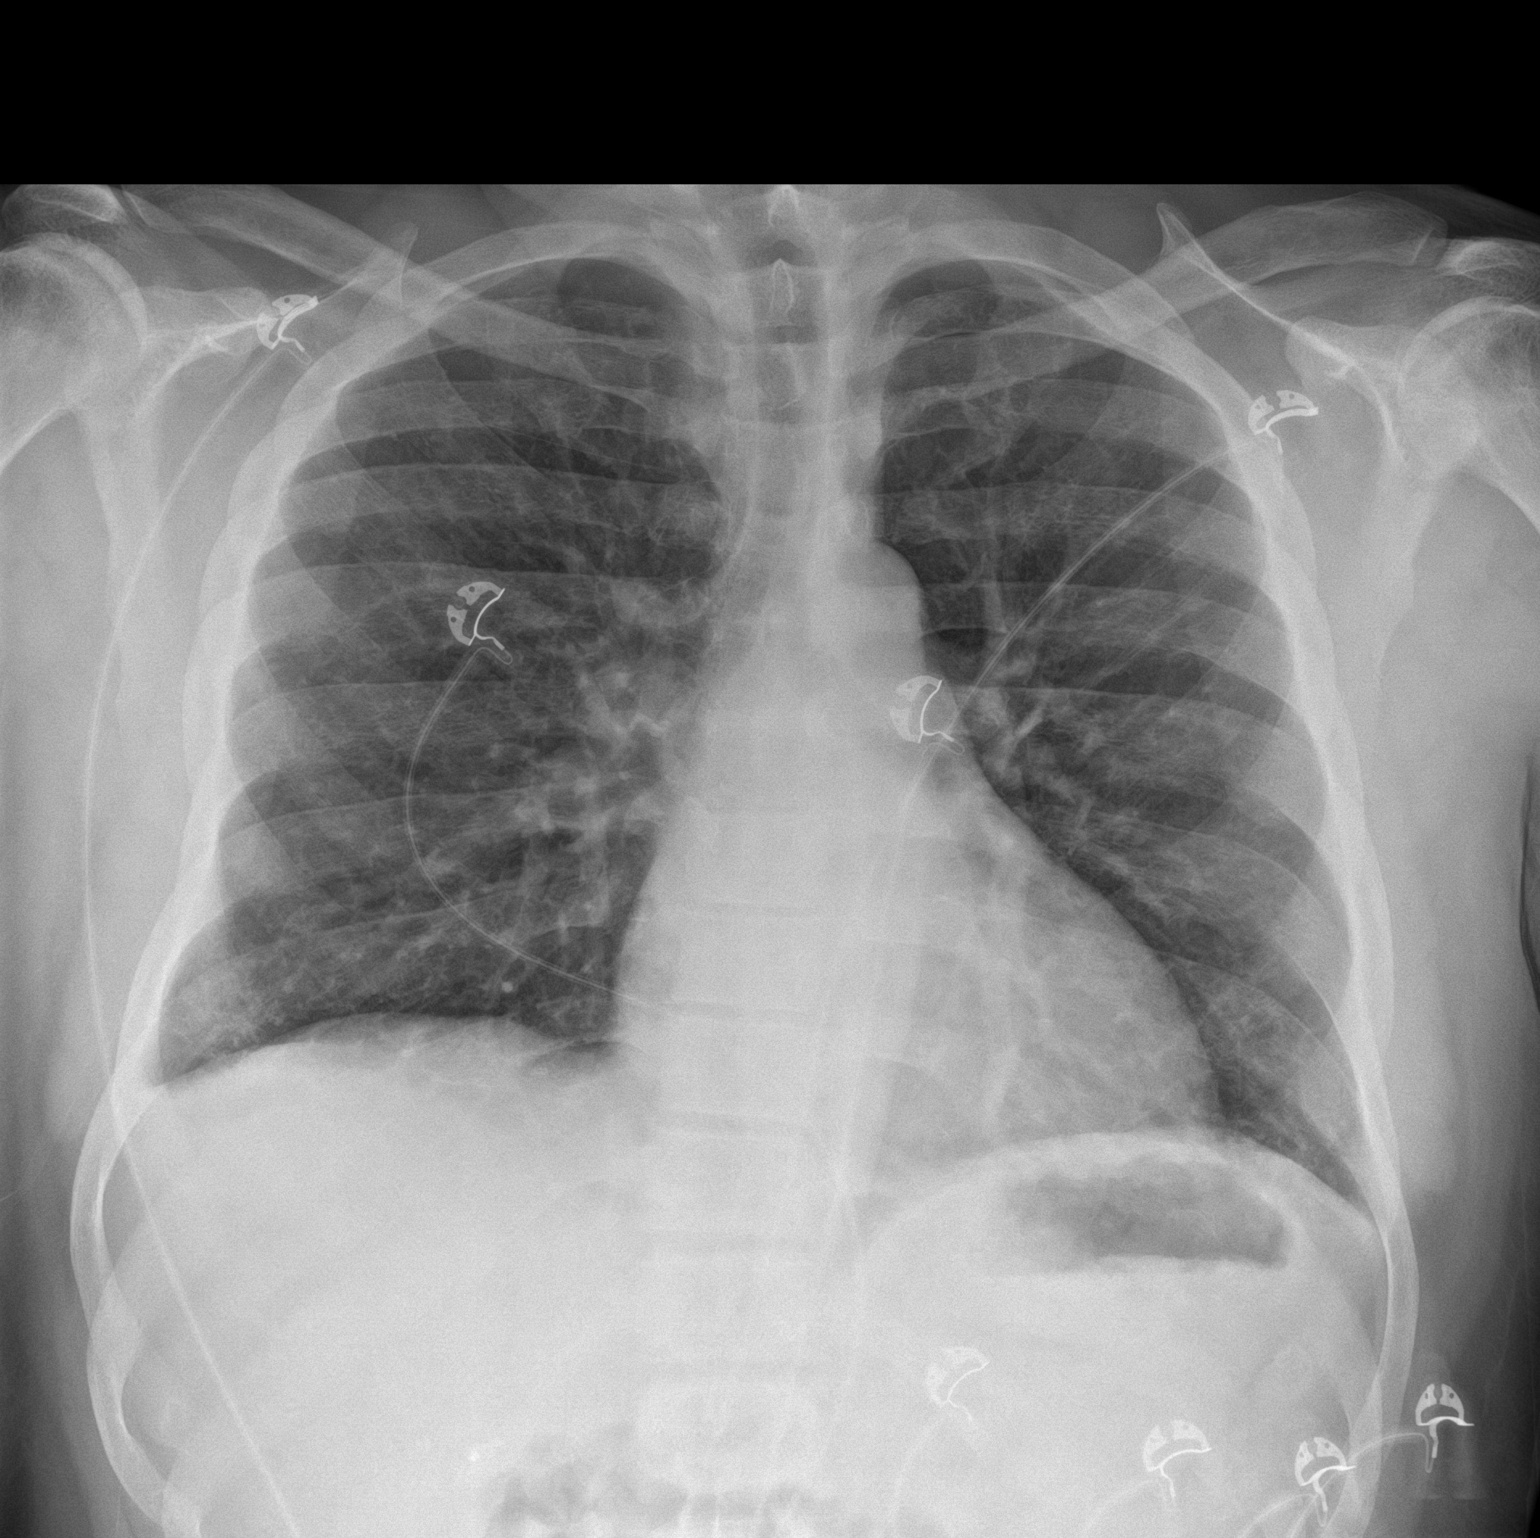

[chest lat]
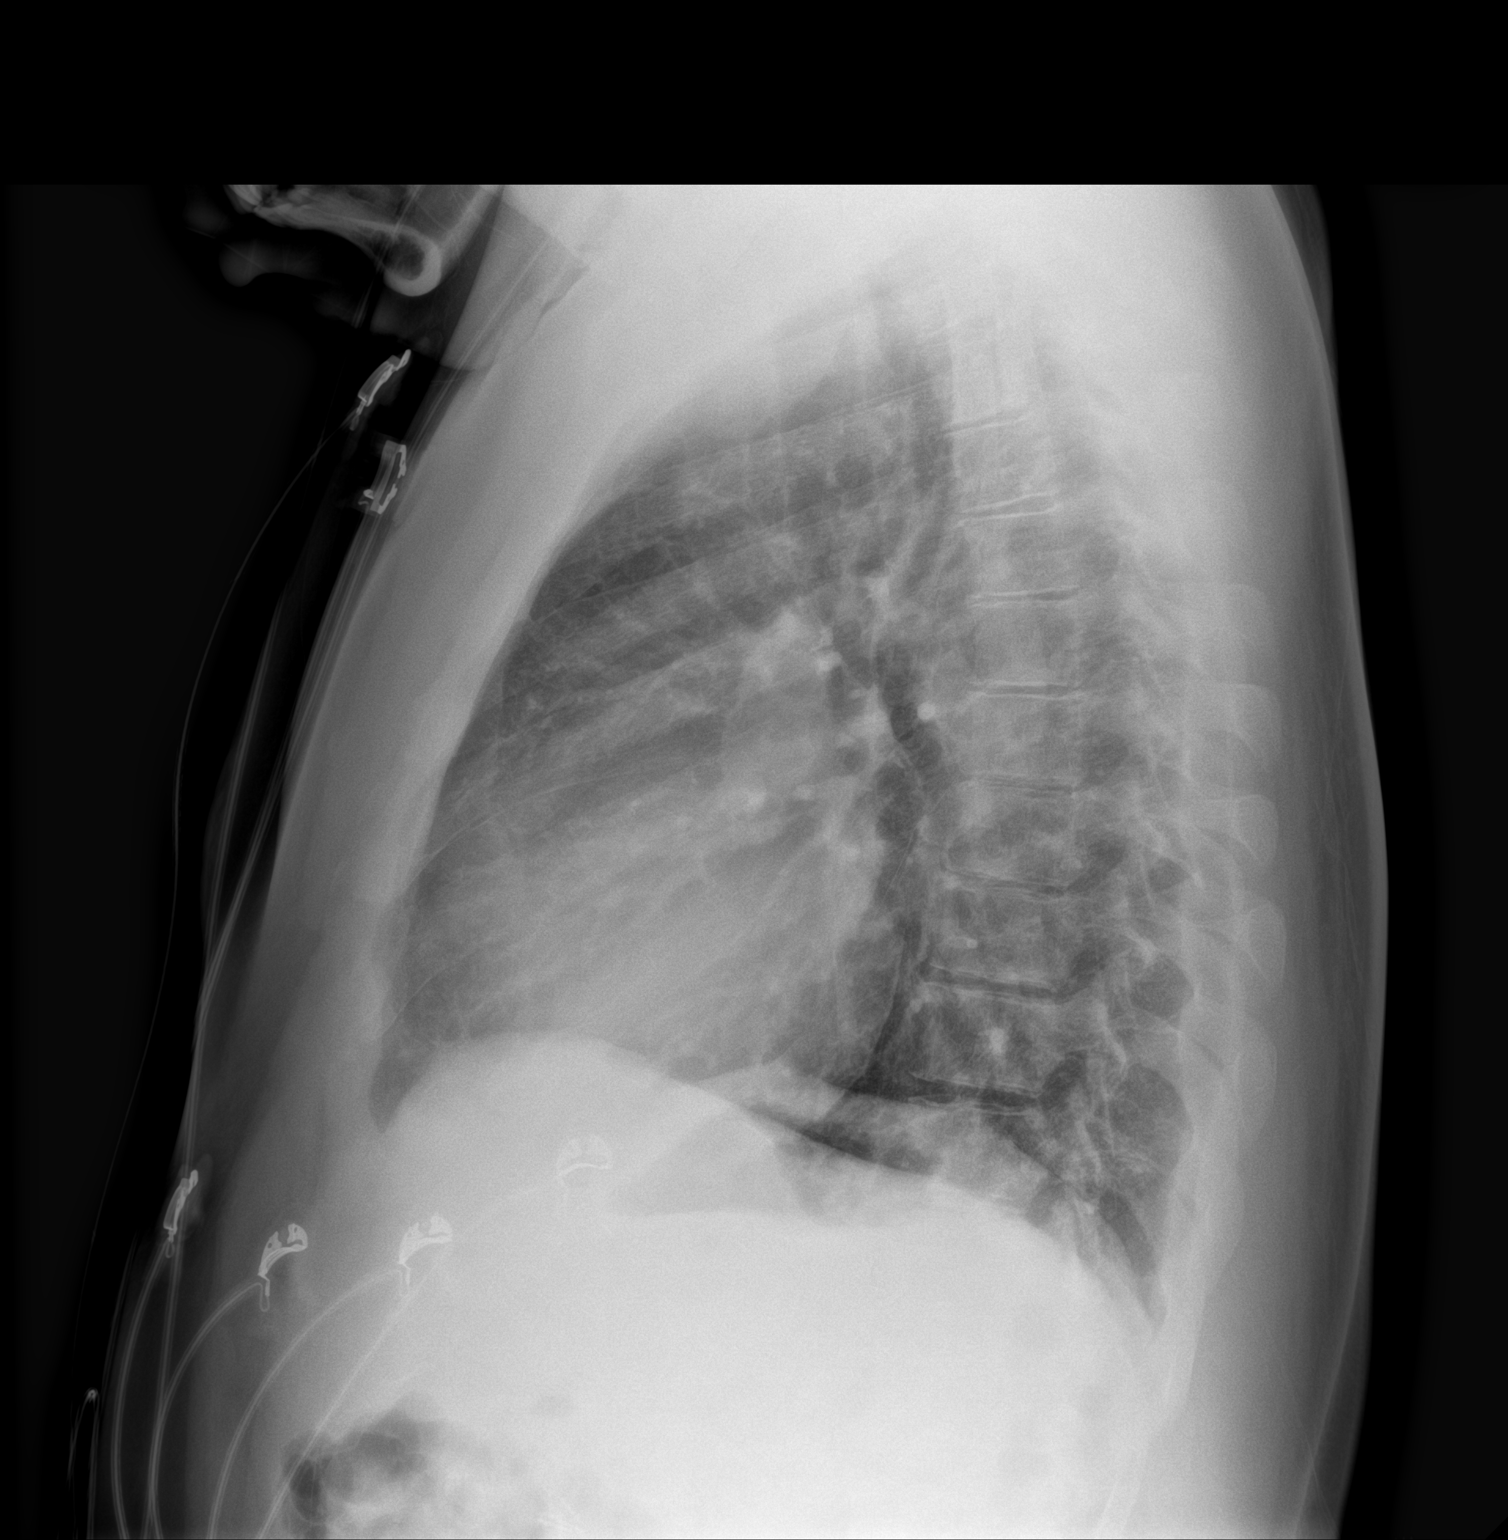

[2 of 2 positions shown; findings below may reference images not displayed]

FINDINGS: Cardiac size is within normal limits. There is interval decrease in
size of the heart. There is decrease in the pulmonary vascular
congestion. In the current study, there are no signs of pulmonary
edema. Small patchy infiltrate is seen in the lateral aspect of
right lower lung fields. There is minimal blunting of right lateral
CP angle. There is no pneumothorax.
IMPRESSION: Small patchy infiltrate is seen in the lateral aspect of right lower
lung fields suggesting possible pneumonia. Possible minimal right
pleural effusion.

## 2021-09-26 IMAGING — CT CT HEAD W/O CM
4 series · 16 of 47 positions shown, 18 images · non-contrast
Comparison: None.

CLINICAL DATA: Dizziness



[Series 2: head bone · axial · 0.48mm/px · z∈[-162,-130]mm · 3 of 82 slices shown]
[im 9/82  bone]
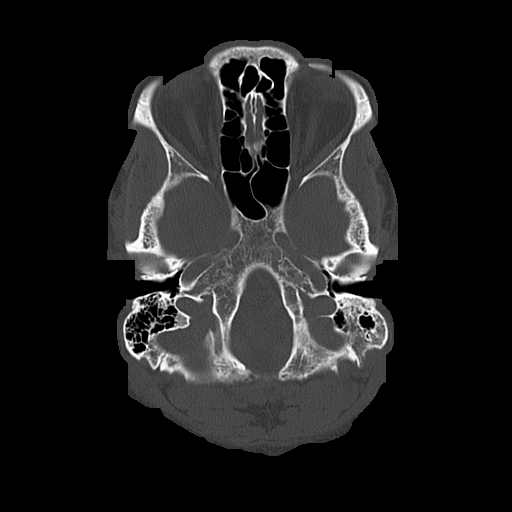
[im 17/82  bone]
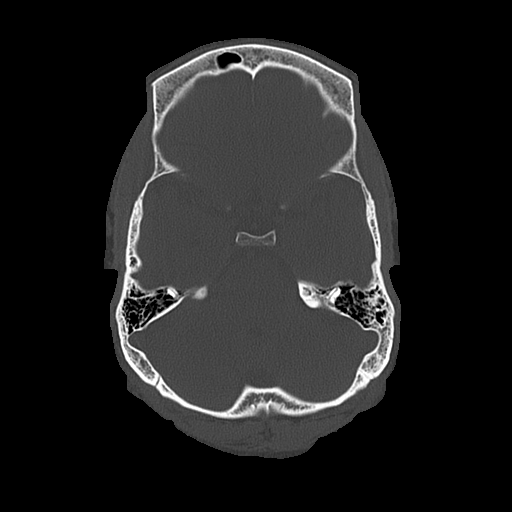
[im 25/82  bone]
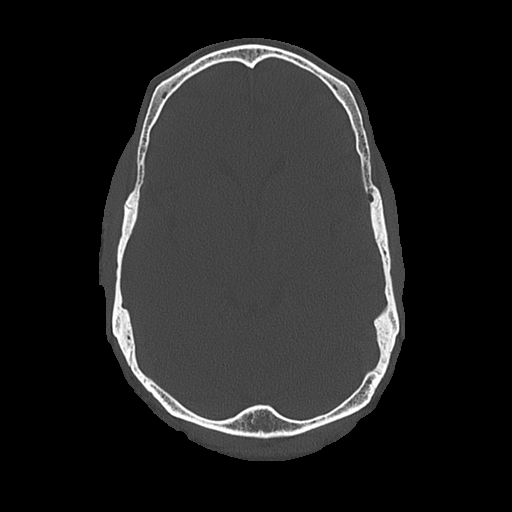

[Series 3: head wo · axial · 0.48mm/px · z∈[-158,-38]mm · 7 of 33 slices shown, 9 images]
[im 5/33  brain]
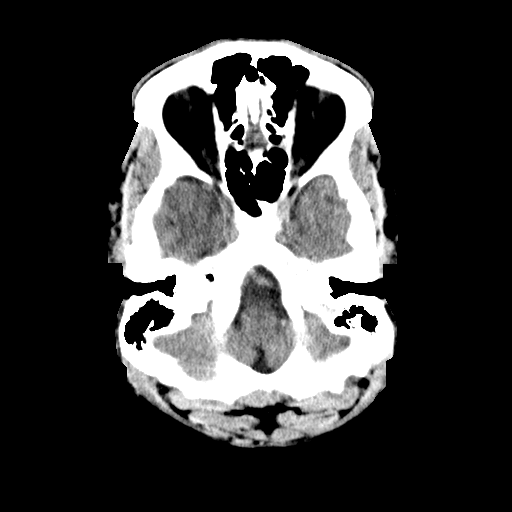
[im 5/33  bone]
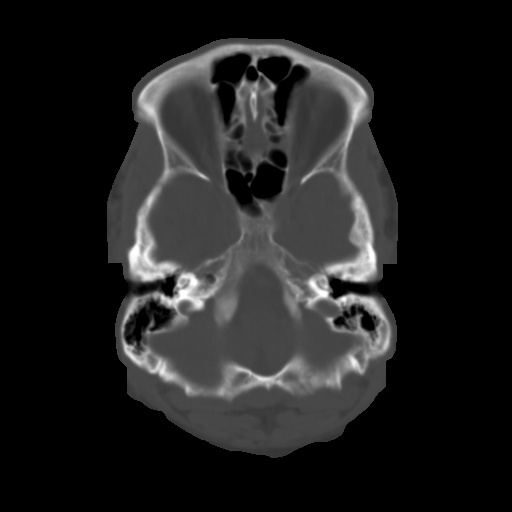
[im 9/33  brain]
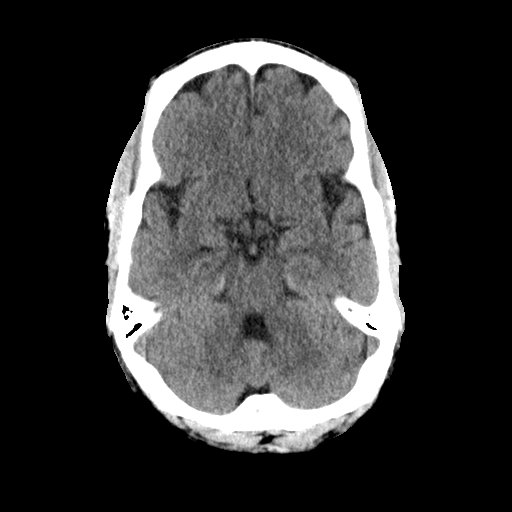
[im 13/33  brain]
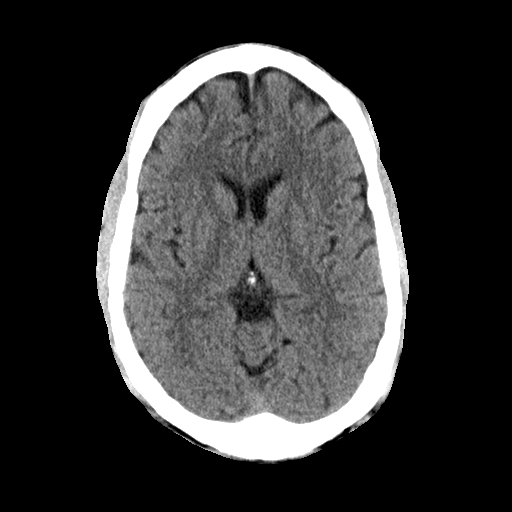
[im 17/33  brain]
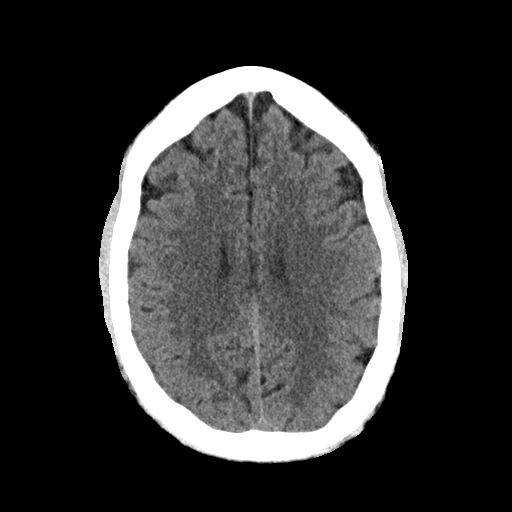
[im 21/33  brain]
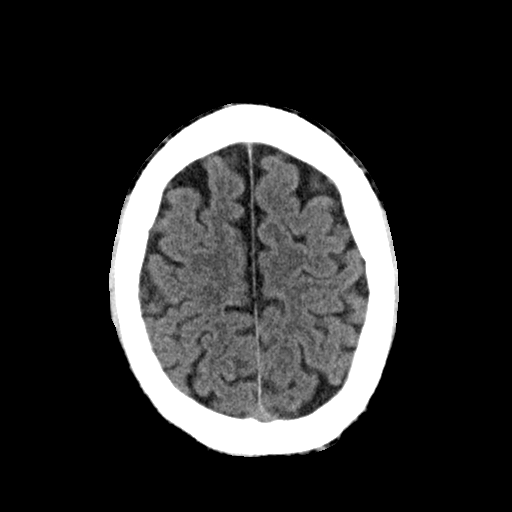
[im 21/33  bone]
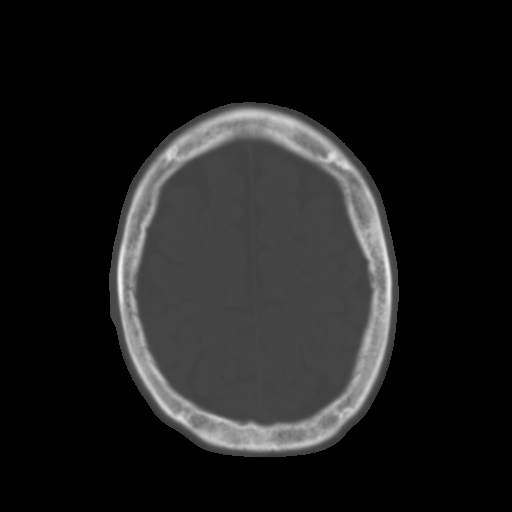
[im 25/33  brain]
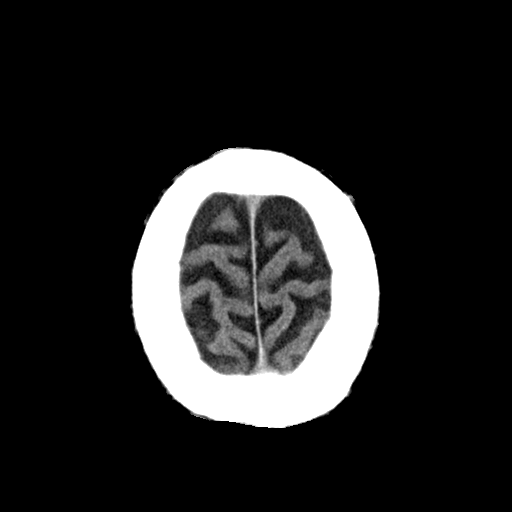
[im 29/33  brain]
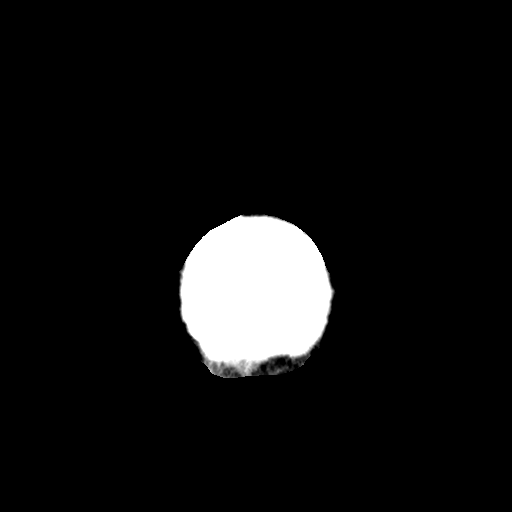

[Series 4: coronal soft · coronal · 0.30mm/px · 3 of 71 slices shown]
[im 24/71  brain]
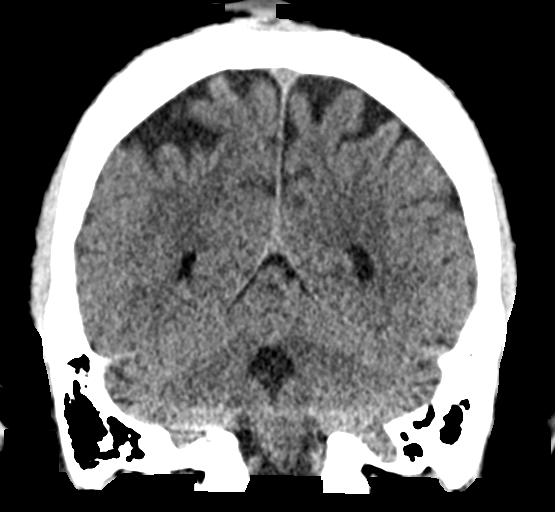
[im 32/71  brain]
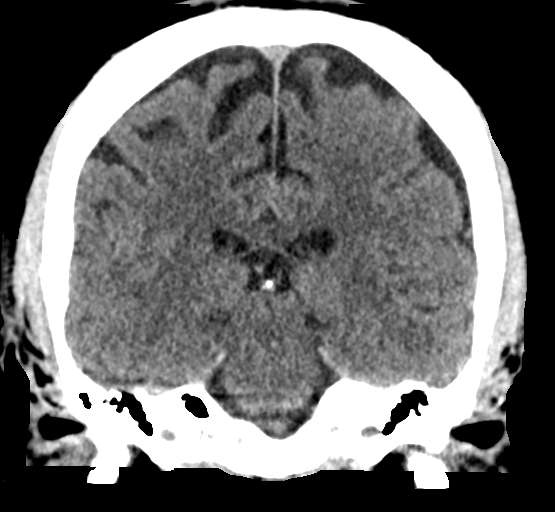
[im 39/71  brain]
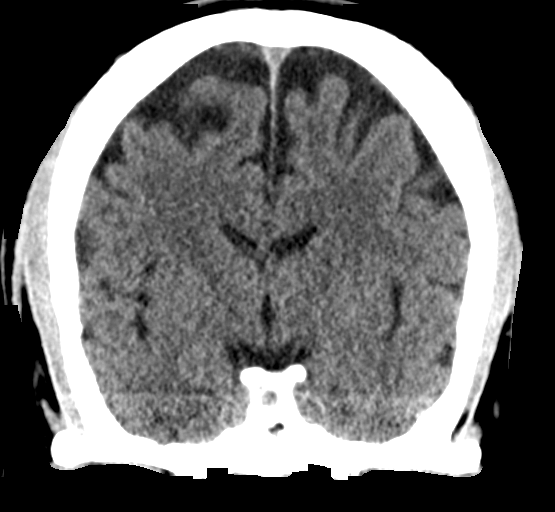

[Series 5: sagittal soft · sagittal · 0.30mm/px · 3 of 57 slices shown]
[im 19/57  brain]
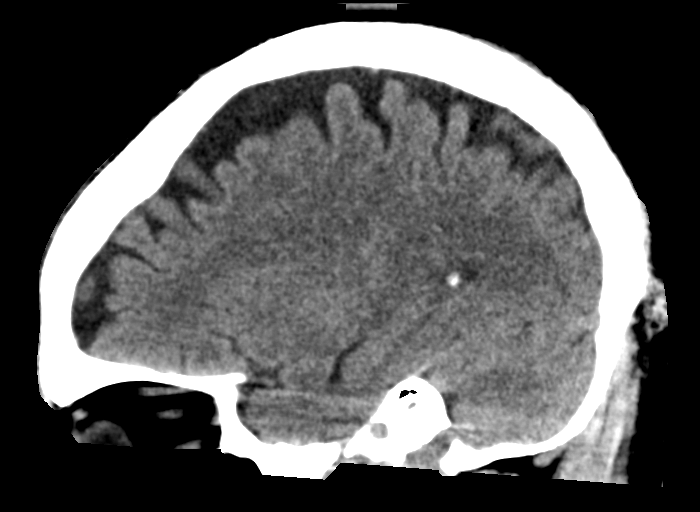
[im 29/57  brain]
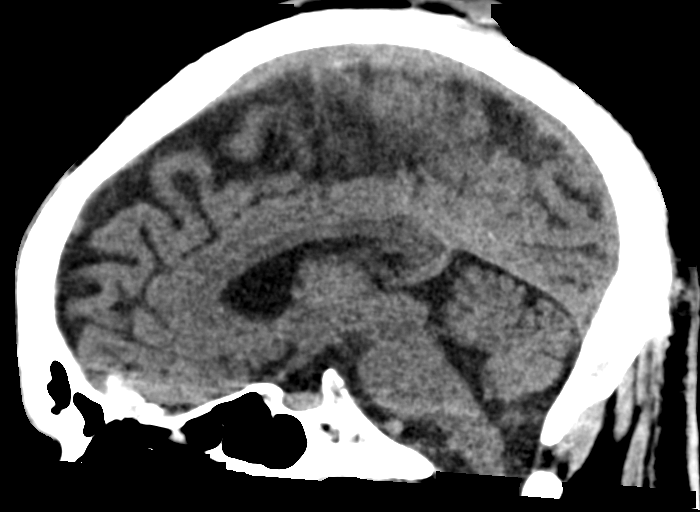
[im 38/57  brain]
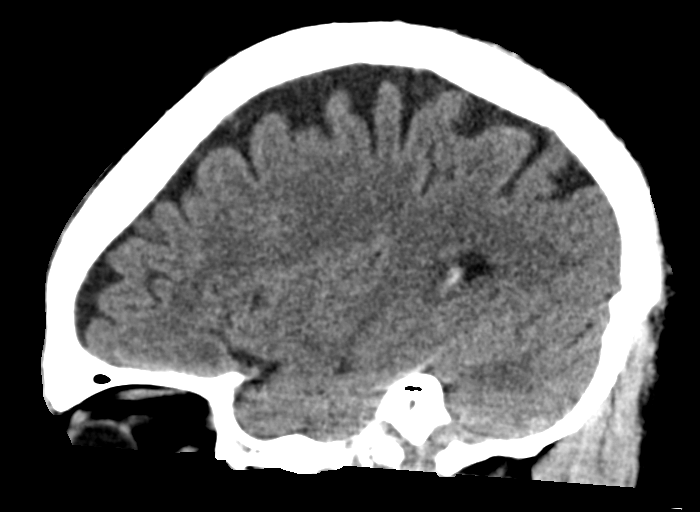

[16 of 47 positions shown; findings below may reference images not displayed]

FINDINGS: Brain: No evidence of acute infarction, hemorrhage, hydrocephalus,
extra-axial collection or mass lesion/mass effect.

Vascular: No hyperdense vessel or unexpected calcification.

Skull: Normal. Negative for fracture or focal lesion.

Sinuses/Orbits: No acute finding.

Other: Soft tissue nodularity of the posterior superior scalp.
IMPRESSION: 1. No acute intracranial abnormality.
2. Soft tissue nodularity of the posterior superior scalp. Correlate
clinically.

## 2021-09-26 NOTE — Discharge Instructions (Addendum)
It was our pleasure to provide your ER care today - we hope that you feel better. ? ?Drink plenty of fluids/stay well hydrated.  ? ?Follow up closely with primary care doctor in the coming week for recheck. Also discuss whether continued need for blood pressure med then.  ? ?Return to ER if worse, new symptoms, fevers, fainting, chest pain, trouble breathing, new/severe pain, cough or shortness of breath, change in speech or vision, numbness/weakness, or other concern. ? ?

## 2021-09-26 NOTE — ED Notes (Signed)
Offered fluids and crackers with PNB ?

## 2021-09-26 NOTE — ED Triage Notes (Signed)
Syncope since Monday ,constant. Denies any other symptoms ?

## 2021-09-26 NOTE — ED Provider Notes (Signed)
?[facility] EMERGENCY DEPT ?Provider Note ? ? ?CSN: [id number] ?Arrival date & time: 09/26/21  1453 ? ?  ? ?History ? ?Chief Complaint  ?Patient presents with  ? Near Syncope  ? ? ?Michael Mullins is a 41 y.o. male. ? ?Patient with hx sickle cell disease, c/o feeling lightheaded/faint for past three days. Symptoms gradual onset, moderate, persistent, worse w standing. Denies room spinning or vertigo. No hearing loss or tinnitus. No headache. No chest pain or discomfort. No sob. No abd pain or vomiting/diarrhea. No change in stools, rectal bleeding or black stools. No recent blood loss. No dysuria or gu c/o. Normal po intake. No recent change in meds. No fever or chills. No change in speech or vision. No numbness/weakness or change in baseline functional ability. Does not feel like prior sickle cell related pain/issues.  ? ?The history is provided by the patient and medical records.  ?Near Syncope ?Pertinent negatives include no chest pain, no abdominal pain, no headaches and no shortness of breath.  ? ?  ? ?Home Medications ?Prior to Admission medications   ?Medication Sig Start Date End Date Taking? Authorizing Provider  ?ACCUTANE 40 MG capsule Take 40 mg by mouth daily. 04/30/21   [provider]  ?collagenase (SANTYL) ointment Apply to the affected area topically daily. ?Patient not taking: Reported on 06/15/2021 01/26/21   Massie Maroon, FNP  ?empagliflozin (JARDIANCE) 10 MG TABS tablet Take 1 tablet (10 mg total) by mouth daily. 11/03/20     ?ergocalciferol (VITAMIN D2) 1.25 MG (50000 UT) capsule Take 1 capsule by mouth every 30 days 08/07/20     ?ferrous sulfate (FEROSUL) 325 (65 FE) MG tablet Take by mouth. 08/07/20     ?folic acid (FOLVITE) 1 MG tablet Take 1 tablet by mouth daily ?Patient taking differently: Take 1 mg by mouth daily. 08/07/20     ?guaiFENesin-dextromethorphan (ROBITUSSIN DM) 100-10 MG/5ML syrup Take 10 mLs by mouth every 4 (four) hours as needed for cough. 05/28/21    Quentin Angst, MD  ?lisinopril (ZESTRIL) 2.5 MG tablet Take 1 tablet (2.5 mg total) by mouth daily. 11/03/20     ?methadone (DOLOPHINE) 10 MG tablet Take 3 tablets (30 mg total) by mouth 3 (three) times daily. 08/27/21     ?naloxone (NARCAN) nasal spray 4 mg/0.1 mL Use as directed every 2-3 minutes until emergency arrives, call 911 ?Patient not taking: Reported on 05/25/2021 08/07/20     ?ondansetron (ZOFRAN) 4 MG tablet Take 1 tablet (4 mg total) by mouth every 8 (eight) hours as needed for Nausea / Vomiting. 02/22/21     ?OXBRYTA 500 MG TABS tablet Take 1,500 mg by mouth daily. 01/01/21   [provider]  ?oxycodone (ROXICODONE) 30 MG immediate release tablet Take 1 tablet by mouth every 6 hours as needed for Pain. 08/27/21     ?triamcinolone cream (KENALOG) 0.1 % Apply to affected skin of arms twice daily as needed. NEVER to face/neck/groin. 11/28/20     ?Vitamin D, Ergocalciferol, (DRISDOL) 1.25 MG (50000 UNIT) CAPS capsule Take 1 capsule by mouth every 30 days ?Patient taking differently: Take 50,000 Units by mouth every 30 (thirty) days. 05/24/21     ?   ? ?Allergies    ?Morphine and Vancomycin   ? ?Review of Systems   ?Review of Systems  ?Constitutional:  Negative for fever.  ?HENT:  Negative for sore throat.   ?Eyes:  Negative for redness and visual disturbance.  ?Respiratory:  Negative for cough and shortness  of breath.   ?Cardiovascular:  Positive for near-syncope. Negative for chest pain, palpitations and leg swelling.  ?Gastrointestinal:  Negative for abdominal pain, blood in stool and vomiting.  ?Genitourinary:  Negative for dysuria and flank pain.  ?Musculoskeletal:  Negative for back pain and neck pain.  ?Skin:  Negative for rash.  ?Neurological:  Positive for light-headedness. Negative for syncope, weakness, numbness and headaches.  ?Hematological:  Does not bruise/bleed easily.  ?Psychiatric/Behavioral:  Negative for confusion.   ? ?Physical Exam ?Updated Vital Signs ?BP 126/75   Pulse 70    Temp 98.1 ?F (36.7 ?C) (Oral)   Resp 20   SpO2 95%  ?Physical Exam ?Vitals and nursing note reviewed.  ?Constitutional:   ?   Appearance: Normal appearance. He is well-developed.  ?HENT:  ?   Head: Atraumatic.  ?   Right Ear: Tympanic membrane normal.  ?   Left Ear: Tympanic membrane normal.  ?   Nose: Nose normal.  ?   Mouth/Throat:  ?   Mouth: Mucous membranes are moist.  ?   Pharynx: Oropharynx is clear.  ?Eyes:  ?   General: No scleral icterus. ?   Extraocular Movements: Extraocular movements intact.  ?   Pupils: Pupils are equal, round, and reactive to light.  ?   Comments: Pale.  ?Neck:  ?   Vascular: No carotid bruit.  ?   Trachea: No tracheal deviation.  ?Cardiovascular:  ?   Rate and Rhythm: Normal rate and regular rhythm.  ?   Pulses: Normal pulses.  ?   Heart sounds: Normal heart sounds. No murmur heard. ?  No friction rub. No gallop.  ?Pulmonary:  ?   Effort: Pulmonary effort is normal. No accessory muscle usage or respiratory distress.  ?   Breath sounds: Normal breath sounds.  ?Abdominal:  ?   General: Bowel sounds are normal. There is no distension.  ?   Palpations: Abdomen is soft.  ?   Tenderness: There is no abdominal tenderness.  ?Genitourinary: ?   Comments: No cva tenderness. ?Musculoskeletal:     ?   General: No swelling or tenderness.  ?   Cervical back: Normal range of motion and neck supple. No rigidity.  ?   Right lower leg: No edema.  ?   Left lower leg: No edema.  ?Skin: ?   General: Skin is warm and dry.  ?   Findings: No rash.  ?Neurological:  ?   Mental Status: He is alert.  ?   Comments: Alert, speech clear. Motor intact bil, stre 5/5. No pronator drift. Sens grossly intact bil. Steady gait, no ataxia.   ?Psychiatric:     ?   Mood and Affect: Mood normal.  ? ? ?ED Results / Procedures / Treatments   ?Labs ?(all labs ordered are listed, but only abnormal results are displayed) ?Results for orders placed or performed during the hospital encounter of 09/26/21  ?CBC  ?Result Value Ref  Range  ? WBC 11.6 (H) 4.0 - 10.5 K/uL  ? RBC 3.12 (L) 4.22 - 5.81 MIL/uL  ? Hemoglobin 9.6 (L) 13.0 - 17.0 g/dL  ? HCT 26.8 (L) 39.0 - 52.0 %  ? MCV 85.9 80.0 - 100.0 fL  ? MCH 30.8 26.0 - 34.0 pg  ? MCHC 35.8 30.0 - 36.0 g/dL  ? RDW 21.4 (H) 11.5 - 15.5 %  ? Platelets 313 150 - 400 K/uL  ? nRBC 0.3 (H) 0.0 - 0.2 %  ?Urinalysis, Routine w reflex microscopic  ?  Result Value Ref Range  ? Color, Urine ORANGE (A) YELLOW  ? APPearance CLEAR CLEAR  ? Specific Gravity, Urine 1.015 1.005 - 1.030  ? pH 6.0 5.0 - 8.0  ? Glucose, UA NEGATIVE NEGATIVE mg/dL  ? Hgb urine dipstick TRACE (A) NEGATIVE  ? Bilirubin Urine NEGATIVE NEGATIVE  ? Ketones, ur NEGATIVE NEGATIVE mg/dL  ? Protein, ur 30 (A) NEGATIVE mg/dL  ? Nitrite NEGATIVE NEGATIVE  ? Leukocytes,Ua NEGATIVE NEGATIVE  ? RBC / HPF 0-5 0 - 5 RBC/hpf  ? WBC, UA 0-5 0 - 5 WBC/hpf  ? Mucus PRESENT   ? Hyaline Casts, UA PRESENT   ?Comprehensive metabolic panel  ?Result Value Ref Range  ? Sodium 137 135 - 145 mmol/L  ? Potassium 3.7 3.5 - 5.1 mmol/L  ? Chloride 107 98 - 111 mmol/L  ? CO2 23 22 - 32 mmol/L  ? Glucose, Bld 98 70 - 99 mg/dL  ? BUN 5 (L) 6 - 20 mg/dL  ? Creatinine, Ser 0.68 0.61 - 1.24 mg/dL  ? Calcium 9.6 8.9 - 10.3 mg/dL  ? Total Protein 7.6 6.5 - 8.1 g/dL  ? Albumin 4.3 3.5 - 5.0 g/dL  ? AST 57 (H) 15 - 41 U/L  ? ALT 85 (H) 0 - 44 U/L  ? Alkaline Phosphatase 62 38 - 126 U/L  ? Total Bilirubin 4.6 (H) 0.3 - 1.2 mg/dL  ? GFR, Estimated >60 >60 mL/min  ? Anion gap 7 5 - 15  ?CBG monitoring, ED  ?Result Value Ref Range  ? Glucose-Capillary 97 70 - 99 mg/dL  ? ? ?EKG ?EKG Interpretation ? ?Date/Time:  Wednesday September 26 2021 15:05:40 EDT ?Ventricular Rate:  66 ?PR Interval:  147 ?QRS Duration: 110 ?QT Interval:  412 ?QTC Calculation: 432 ?R Axis:   32 ?Text Interpretation: Sinus rhythm Nonspecific T wave abnormality Confirmed by Cathren LaineSteinl, Seleena Reimers ([id number]) on 09/26/2021 3:20:34 PM ? ?Radiology ?DG Chest 2 View ? ?Result Date: 09/26/2021 ?CLINICAL DATA:  Chest discomfort,  dizziness EXAM: CHEST - 2 VIEW COMPARISON:  06/08/2021 FINDINGS: Cardiac size is within normal limits. There is interval decrease in size of the heart. There is decrease in the pulmonary vascular congestion. In the

## 2021-09-27 ENCOUNTER — Other Ambulatory Visit (HOSPITAL_COMMUNITY): Payer: Self-pay

## 2021-09-27 DIAGNOSIS — D571 Sickle-cell disease without crisis: Secondary | ICD-10-CM | POA: Diagnosis not present

## 2021-09-27 DIAGNOSIS — E119 Type 2 diabetes mellitus without complications: Secondary | ICD-10-CM | POA: Diagnosis not present

## 2021-09-27 DIAGNOSIS — Z0000 Encounter for general adult medical examination without abnormal findings: Secondary | ICD-10-CM | POA: Diagnosis not present

## 2021-09-27 DIAGNOSIS — Z20822 Contact with and (suspected) exposure to covid-19: Secondary | ICD-10-CM | POA: Diagnosis not present

## 2021-09-27 DIAGNOSIS — R42 Dizziness and giddiness: Secondary | ICD-10-CM | POA: Diagnosis not present

## 2021-09-27 DIAGNOSIS — R6889 Other general symptoms and signs: Secondary | ICD-10-CM | POA: Diagnosis not present

## 2021-09-27 MED ORDER — AMOXICILLIN-POT CLAVULANATE 875-125 MG PO TABS
1.0000 | ORAL_TABLET | Freq: Two times a day (BID) | ORAL | 0 refills | Status: DC
  Filled 2021-09-27: qty 14, 7d supply, fill #0

## 2021-09-29 ENCOUNTER — Other Ambulatory Visit: Payer: Self-pay

## 2021-09-29 ENCOUNTER — Emergency Department (HOSPITAL_BASED_OUTPATIENT_CLINIC_OR_DEPARTMENT_OTHER)
Admission: EM | Admit: 2021-09-29 | Discharge: 2021-09-29 | Disposition: A | Discharge status: Home / Self Care | Payer: 59

## 2021-09-29 NOTE — ED Notes (Signed)
He tells me he arrived here by mistake. He states he needs MRI. ?

## 2021-10-01 ENCOUNTER — Encounter (HOSPITAL_COMMUNITY): Payer: Self-pay | Admitting: Emergency Medicine

## 2021-10-01 ENCOUNTER — Emergency Department (HOSPITAL_COMMUNITY): Payer: 59

## 2021-10-01 ENCOUNTER — Other Ambulatory Visit: Payer: Self-pay

## 2021-10-01 ENCOUNTER — Emergency Department (HOSPITAL_COMMUNITY)
Admission: EM | Admit: 2021-10-01 | Discharge: 2021-10-01 | Disposition: A | Discharge status: Home / Self Care | Payer: 59 | Attending: Emergency Medicine | Admitting: Emergency Medicine

## 2021-10-01 DIAGNOSIS — R531 Weakness: Secondary | ICD-10-CM | POA: Insufficient documentation

## 2021-10-01 DIAGNOSIS — H5500 Unspecified nystagmus: Secondary | ICD-10-CM | POA: Insufficient documentation

## 2021-10-01 DIAGNOSIS — R42 Dizziness and giddiness: Secondary | ICD-10-CM | POA: Diagnosis not present

## 2021-10-01 DIAGNOSIS — H8112 Benign paroxysmal vertigo, left ear: Secondary | ICD-10-CM

## 2021-10-01 LAB — URINALYSIS, ROUTINE W REFLEX MICROSCOPIC
Bacteria, UA: NONE SEEN
Bilirubin Urine: NEGATIVE
Glucose, UA: >=500 mg/dL — AB
Hgb urine dipstick: NEGATIVE
Ketones, ur: NEGATIVE mg/dL
Leukocytes,Ua: NEGATIVE
Nitrite: NEGATIVE
Protein, ur: NEGATIVE mg/dL
Specific Gravity, Urine: 1.012 (ref 1.005–1.030)
pH: 6 (ref 5.0–8.0)

## 2021-10-01 LAB — BASIC METABOLIC PANEL
Anion gap: 8 (ref 5–15)
BUN: 8 mg/dL (ref 6–20)
CO2: 22 mmol/L (ref 22–32)
Calcium: 9.3 mg/dL (ref 8.9–10.3)
Chloride: 108 mmol/L (ref 98–111)
Creatinine, Ser: 0.76 mg/dL (ref 0.61–1.24)
GFR, Estimated: >60 mL/min (ref >60)
Glucose, Bld: 83 mg/dL (ref 70–99)
Potassium: 4.1 mmol/L (ref 3.5–5.1)
Sodium: 138 mmol/L (ref 135–145)

## 2021-10-01 LAB — CBC
HCT: 31.4 % — ABNORMAL LOW (ref 39.0–52.0)
Hemoglobin: 11 g/dL — ABNORMAL LOW (ref 13.0–17.0)
MCH: 31.1 pg (ref 26.0–34.0)
MCHC: 35 g/dL (ref 30.0–36.0)
MCV: 88.7 fL (ref 80.0–100.0)
Platelets: 377 10*3/uL (ref 150–400)
RBC: 3.54 MIL/uL — ABNORMAL LOW (ref 4.22–5.81)
RDW: 18.9 % — ABNORMAL HIGH (ref 11.5–15.5)
WBC: 11.4 10*3/uL — ABNORMAL HIGH (ref 4.0–10.5)
nRBC: 1.3 % — ABNORMAL HIGH (ref 0.0–0.2)

## 2021-10-01 LAB — RETICULOCYTES
Immature Retic Fract: 41 % — ABNORMAL HIGH (ref 2.3–15.9)
RBC.: 3.45 MIL/uL — ABNORMAL LOW (ref 4.22–5.81)
Retic Count, Absolute: 364 10*3/uL — ABNORMAL HIGH (ref 19.0–186.0)
Retic Ct Pct: 10.6 % — ABNORMAL HIGH (ref 0.4–3.1)

## 2021-10-01 LAB — CBG MONITORING, ED: Glucose-Capillary: 82 mg/dL (ref 70–99)

## 2021-10-01 IMAGING — MR MR HEAD W/O CM
10 series · 48 of 48 positions shown · non-contrast
Comparison: CT head [DATE].

CLINICAL DATA: Dizziness, non-specific dizziness, x 1 week, PCP
sent for MR

EXAM:
MRI HEAD WITHOUT CONTRAST
TECHNIQUE: Multiplanar, multiecho pulse sequences of the brain and surrounding
structures were obtained without intravenous contrast.

[Series 5: DWI · axial · 3.0mm · 1.36mm/px · z∈[-60,+81]mm · 8 of 96 slices shown (1 of 2)]
[im 1/96]
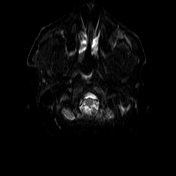
[im 14/96]
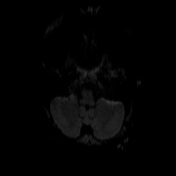
[im 28/96]
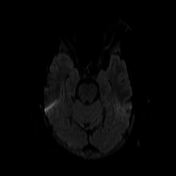
[im 41/96]
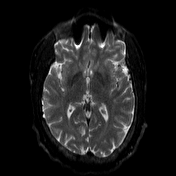
[im 55/96]
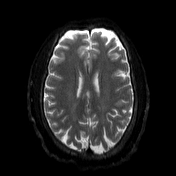
[im 68/96]
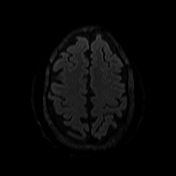
[im 82/96]
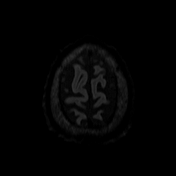
[im 96/96]
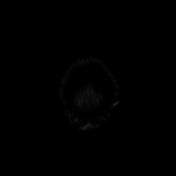

[Series 6: DWI · axial · 3.0mm · 1.36mm/px · z∈[-60,+81]mm · 4 of 48 slices shown (2 of 2)]
[im 1/48]
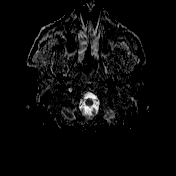
[im 16/48]
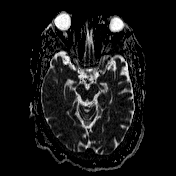
[im 32/48]
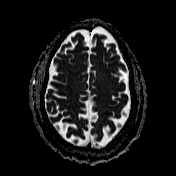
[im 48/48]
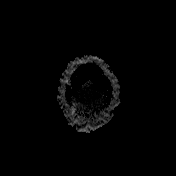

[Series 7: T1 · sagittal · 5.0mm · 0.75mm/px · 2 of 24 slices shown (1 of 2)]
[im 1/24]
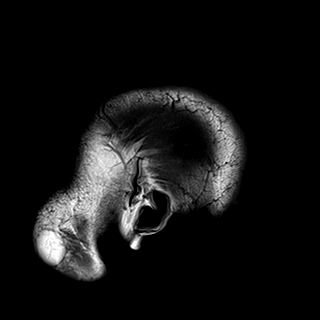
[im 24/24]
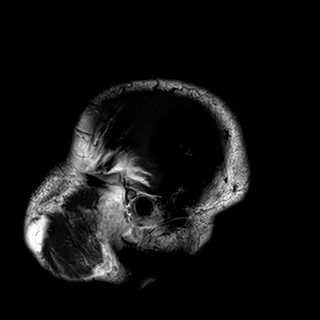

[Series 8: T2 · axial · 5.0mm · 0.62mm/px · z∈[-65,+85]mm · 2 of 24 slices shown (1 of 2)]
[im 1/24]
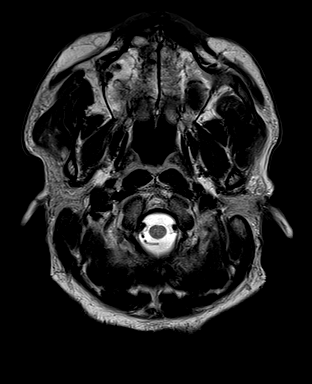
[im 24/24]
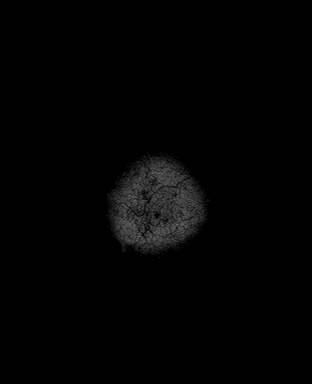

[Series 9: swi_images · axial · 3.0mm · 0.75mm/px · z∈[-96,+117]mm · 6 of 72 slices shown]
[im 1/72]
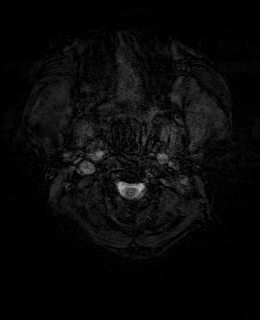
[im 15/72]
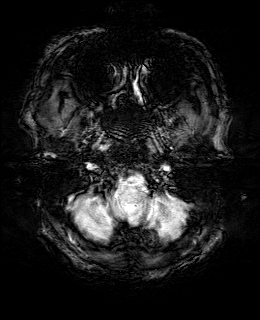
[im 29/72]
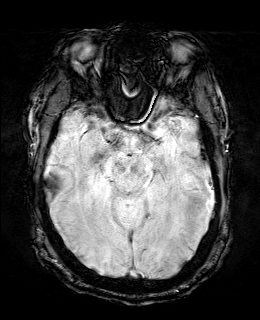
[im 43/72]
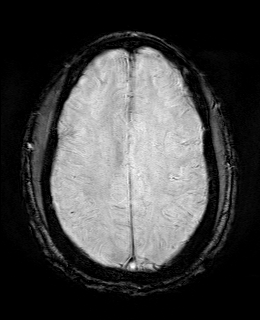
[im 57/72]
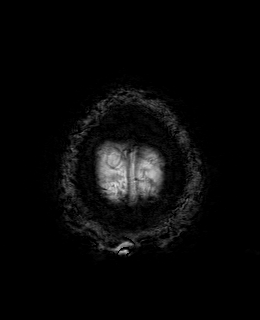
[im 72/72]
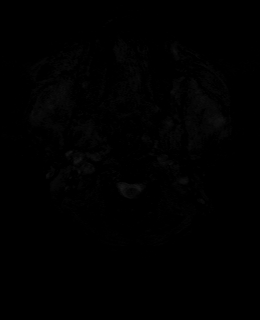

[Series 11: FLAIR · axial · 3.0mm · 0.75mm/px · z∈[-66,+87]mm · 4 of 52 slices shown]
[im 1/52]
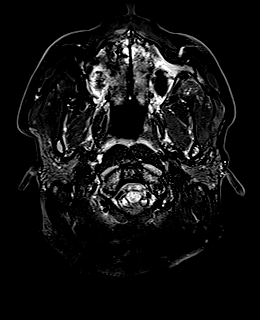
[im 18/52]
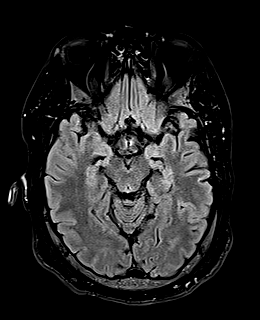
[im 35/52]
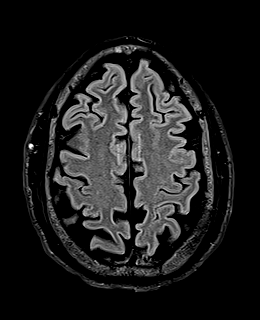
[im 52/52]
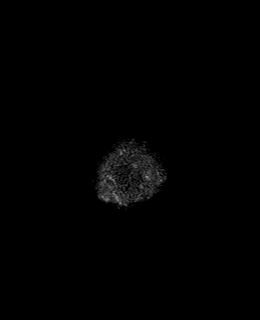

[Series 12: T1 · axial · 1.0mm · 0.94mm/px · z∈[-61,+82]mm · 12 of 144 slices shown (2 of 2)]
[im 1/144]
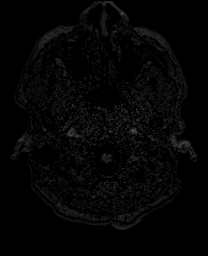
[im 14/144]
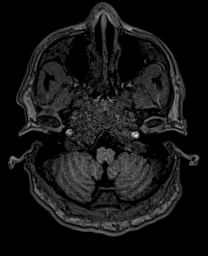
[im 27/144]
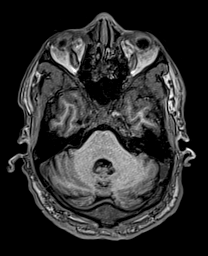
[im 40/144]
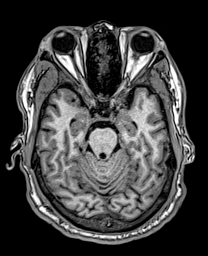
[im 53/144]
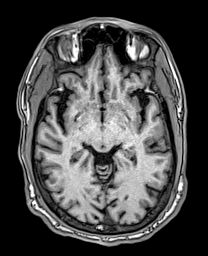
[im 66/144]
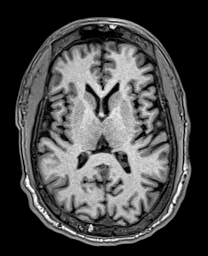
[im 79/144]
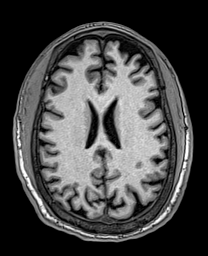
[im 92/144]
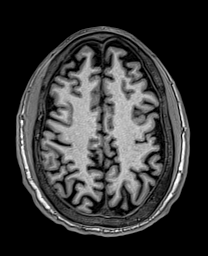
[im 105/144]
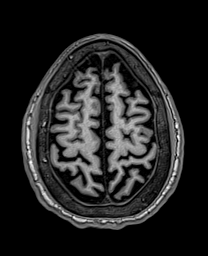
[im 118/144]
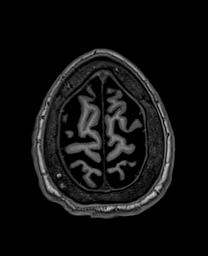
[im 131/144]
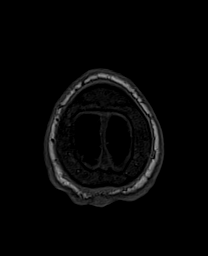
[im 144/144]
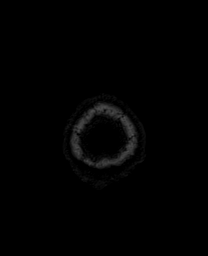

[Series 13: cor dwi_tracew · coronal · 5.0mm · 1.53mm/px · 5 of 56 slices shown]
[im 1/56]
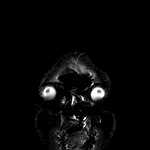
[im 14/56]
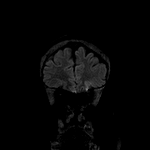
[im 28/56]
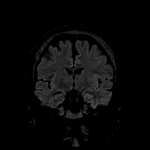
[im 42/56]
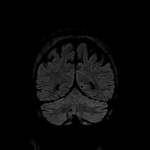
[im 56/56]
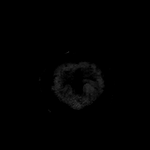

[Series 14: cor dwi_adc · coronal · 5.0mm · 1.53mm/px · 2 of 28 slices shown]
[im 1/28]
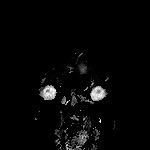
[im 28/28]
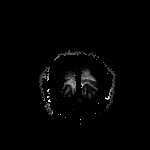

[Series 15: T2 · coronal · 5.0mm · 0.57mm/px · 3 of 38 slices shown (2 of 2)]
[im 1/38]
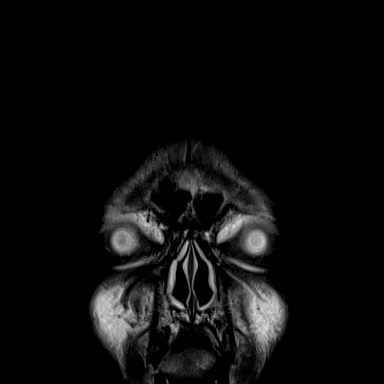
[im 19/38]
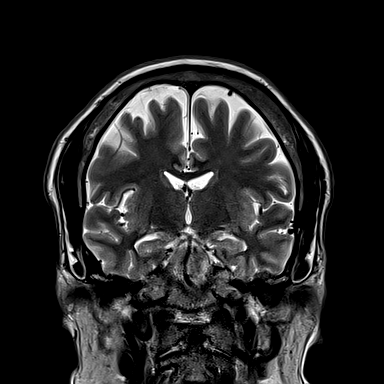
[im 38/38]
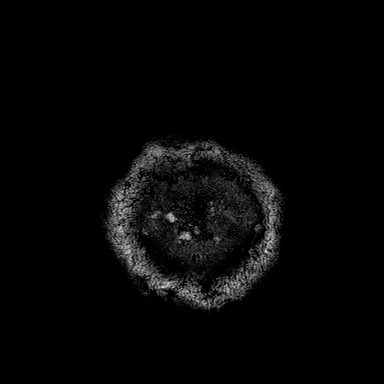

[48 of 48 positions shown; findings below may reference images not displayed]

FINDINGS: Brain: No acute infarction, hemorrhage, hydrocephalus, extra-axial
collection or mass lesion.

Vascular: Major arterial flow voids are maintained at the skull
base.

Skull and upper cervical spine: Diffuse abnormal T1 hypointensity of
the bone marrow with suggestion of DWI and FLAIR hyperintensity.

Sinuses/Orbits: Clear sinuses.  No acute orbital findings.

Other: No mastoid effusions.
IMPRESSION: 1. No evidence of acute intracranial abnormality.
2. Diffusely abnormal marrow signal, probably the sequela of the
patient's known sickle cell disease.

## 2021-10-01 MED ORDER — MECLIZINE HCL 25 MG PO TABS
12.5000 mg | ORAL_TABLET | Freq: Once | ORAL | Status: AC
  Administered 2021-10-01: 12.5 mg via ORAL
  Filled 2021-10-01: qty 1

## 2021-10-01 MED ORDER — MECLIZINE HCL 25 MG PO TABS
25.0000 mg | ORAL_TABLET | Freq: Three times a day (TID) | ORAL | 0 refills | Status: DC | PRN
  Filled 2021-10-01: qty 30, 10d supply, fill #0

## 2021-10-01 NOTE — ED Triage Notes (Signed)
Pt reports dizziness and weakness X1week.  ?

## 2021-10-01 NOTE — ED Provider Triage Note (Signed)
Emergency Medicine Provider Triage Evaluation Note ? ?Michael Mullins , Mullins 41 y.o. male  was evaluated in triage.  Pt complains of dizziness.  Worse with movement however also occurs when he is laying still.  He feels weak on the right side.  Feels like he is going to pass out.  He has history of sickle cell.  Symptoms started on Monday, was seen at [facility] had CT scan subsequently discharged> neg  Seen by PCP, concern for stroke, sent here for MR brain.  No chest pain, back pain, emesis ? ?Review of Systems  ?Positive: Dizziness ?Negative:  ? ?Physical Exam  ?BP 126/71 (BP Location: Left Arm)   Pulse 84   Temp 98.8 ?F (37.1 ?C) (Oral)   Resp 16   SpO2 99%  ?Gen:   Awake, no distress   ?Resp:  Normal effort  ?MSK:   Moves extremities without difficulty  ?Neuro:  Cranial nerves II through XII grossly intact.  Equal strength bilaterally, subjective decrease sensation to right hand, ambulatory without ataxia ?Other:   ? ?Medical Decision Making  ?Medically screening exam initiated at 3:37 PM.  Appropriate orders placed.  Michael Mullins was informed that the remainder of the evaluation will be completed by another provider, this initial triage assessment does not replace that evaluation, and the importance of remaining in the ED until their evaluation is complete. ? ?Dizziness, does seems to have some degree of vertigo, given worse with movement however given history of sickle cell, subjective decrease sensation to right hand, PCP sent for MRI, will proceed with MR brain to rule out posterior circulation etiology. ? ?He is not currently anticoagulated ?  ?Michael Lenoir A, PA-C ?10/01/21 1540 ? ?

## 2021-10-01 NOTE — ED Provider Notes (Signed)
?[place] DEPT ?Provider Note ? ? ?CSN: [id number] ?Arrival date & time: 10/01/21  1237 ? ?  ? ?History ? ?Chief Complaint  ?Patient presents with  ? Dizziness  ? Weakness  ? ? ?Michael Mullins is a 41 y.o. male. ? ?Pt is a 41 yo male with pmh of sickle cell disease presenting for complaints of dizziness.  States he was told to come in by his PCP for rule out stroke.  Patient admits to dizziness described as room spinning when he moves his head or when he stands up.  States this been going on for the past 7 days with no relief.  Patient states if he stays completely still he has no symptoms.  Dates he feels like he has a left-sided facial droop.  No facial droop appreciated on exam at this time.  Was seen at [facility] approximately 5 days ago with a negative CT head scan. ? ?No hearing loss or tinnitus. No headache. No chest pain or discomfort. No sob. No abd pain or vomiting/diarrhea. No change in stools, rectal bleeding or black stools. No recent blood loss.  ? ?The history is provided by the patient. No language interpreter was used.  ?Dizziness ?Associated symptoms: weakness   ?Associated symptoms: no chest pain, no palpitations, no shortness of breath and no vomiting   ?Weakness ?Associated symptoms: dizziness   ?Associated symptoms: no abdominal pain, no arthralgias, no chest pain, no cough, no dysuria, no fever, no seizures, no shortness of breath and no vomiting   ? ?  ? ?Home Medications ?Prior to Admission medications   ?Medication Sig Start Date End Date Taking? Authorizing Provider  ?ACCUTANE 40 MG capsule Take 40 mg by mouth daily. 04/30/21   [provider]  ?amoxicillin-clavulanate (AUGMENTIN) 875-125 MG tablet Take 1 tablet by mouth 2 (two) times daily for 7 days. 09/27/21     ?collagenase (SANTYL) ointment Apply to the affected area topically daily. ?Patient not taking: Reported on 06/15/2021 01/26/21   Dorena Dew, FNP  ?empagliflozin (JARDIANCE)  10 MG TABS tablet Take 1 tablet (10 mg total) by mouth daily. 11/03/20     ?ergocalciferol (VITAMIN D2) 1.25 MG (50000 UT) capsule Take 1 capsule by mouth every 30 days 08/07/20     ?ferrous sulfate (FEROSUL) 325 (65 FE) MG tablet Take by mouth. 08/07/20     ?folic acid (FOLVITE) 1 MG tablet Take 1 tablet by mouth daily ?Patient taking differently: Take 1 mg by mouth daily. 08/07/20     ?guaiFENesin-dextromethorphan (ROBITUSSIN DM) 100-10 MG/5ML syrup Take 10 mLs by mouth every 4 (four) hours as needed for cough. 05/28/21   Tresa Garter, MD  ?lisinopril (ZESTRIL) 2.5 MG tablet Take 1 tablet (2.5 mg total) by mouth daily. 11/03/20     ?methadone (DOLOPHINE) 10 MG tablet Take 3 tablets (30 mg total) by mouth 3 (three) times daily. 08/27/21     ?naloxone (NARCAN) nasal spray 4 mg/0.1 mL Use as directed every 2-3 minutes until emergency arrives, call 911 ?Patient not taking: Reported on 05/25/2021 08/07/20     ?ondansetron (ZOFRAN) 4 MG tablet Take 1 tablet (4 mg total) by mouth every 8 (eight) hours as needed for Nausea / Vomiting. 02/22/21     ?OXBRYTA 500 MG TABS tablet Take 1,500 mg by mouth daily. 01/01/21   [provider]  ?oxycodone (ROXICODONE) 30 MG immediate release tablet Take 1 tablet by mouth every 6 hours as needed for Pain. 08/27/21     ?  triamcinolone cream (KENALOG) 0.1 % Apply to affected skin of arms twice daily as needed. NEVER to face/neck/groin. 11/28/20     ?Vitamin D, Ergocalciferol, (DRISDOL) 1.25 MG (50000 UNIT) CAPS capsule Take 1 capsule by mouth every 30 days ?Patient taking differently: Take 50,000 Units by mouth every 30 (thirty) days. 05/24/21     ?   ? ?Allergies    ?Morphine and Vancomycin   ? ?Review of Systems   ?Review of Systems  ?Constitutional:  Negative for chills and fever.  ?HENT:  Negative for ear pain and sore throat.   ?Eyes:  Negative for pain and visual disturbance.  ?Respiratory:  Negative for cough and shortness of breath.   ?Cardiovascular:  Negative for chest  pain and palpitations.  ?Gastrointestinal:  Negative for abdominal pain and vomiting.  ?Genitourinary:  Negative for dysuria and hematuria.  ?Musculoskeletal:  Negative for arthralgias and back pain.  ?Skin:  Negative for color change and rash.  ?Neurological:  Positive for dizziness and weakness. Negative for seizures and syncope.  ?All other systems reviewed and are negative. ? ?Physical Exam ?Updated Vital Signs ?BP 131/75   Pulse 62   Temp 98.8 ?F (37.1 ?C) (Oral)   Resp 17   Ht 5\' 11"  (1.803 m)   Wt 95 kg   SpO2 100%   BMI 29.21 kg/m?  ?Physical Exam ?Vitals and nursing note reviewed.  ?Constitutional:   ?   General: He is not in acute distress. ?   Appearance: He is well-developed.  ?HENT:  ?   Head: Normocephalic and atraumatic.  ?Eyes:  ?   General: Lids are normal. Vision grossly intact.  ?   Extraocular Movements:  ?   Left eye: Nystagmus present.  ?   Conjunctiva/sclera: Conjunctivae normal.  ?   Pupils: Pupils are equal, round, and reactive to light.  ?   Comments: Left beating nystagmus  ?Cardiovascular:  ?   Rate and Rhythm: Normal rate and regular rhythm.  ?   Heart sounds: No murmur heard. ?Pulmonary:  ?   Effort: Pulmonary effort is normal. No respiratory distress.  ?   Breath sounds: Normal breath sounds.  ?Abdominal:  ?   Palpations: Abdomen is soft.  ?   Tenderness: There is no abdominal tenderness.  ?Musculoskeletal:     ?   General: No swelling.  ?   Cervical back: Neck supple.  ?Skin: ?   General: Skin is warm and dry.  ?   Capillary Refill: Capillary refill takes less than 2 seconds.  ?Neurological:  ?   Mental Status: He is alert and oriented to person, place, and time.  ?   GCS: GCS eye subscore is 4. GCS verbal subscore is 5. GCS motor subscore is 6.  ?   Cranial Nerves: Cranial nerves 2-12 are intact.  ?   Sensory: Sensation is intact.  ?   Motor: Motor function is intact.  ?   Coordination: Coordination is intact.  ?   Gait: Gait is intact.  ?Psychiatric:     ?   Mood and Affect:  Mood normal.  ? ? ?ED Results / Procedures / Treatments   ?Labs ?(all labs ordered are listed, but only abnormal results are displayed) ?Labs Reviewed  ?CBC - Abnormal; Notable for the following components:  ?    Result Value  ? WBC 11.4 (*)   ? RBC 3.54 (*)   ? Hemoglobin 11.0 (*)   ? HCT 31.4 (*)   ? RDW 18.9 (*)   ?  nRBC 1.3 (*)   ? All other components within normal limits  ?URINALYSIS, ROUTINE W REFLEX MICROSCOPIC - Abnormal; Notable for the following components:  ? Glucose, UA >=500 (*)   ? All other components within normal limits  ?BASIC METABOLIC PANEL  ?RETICULOCYTES  ?CBG MONITORING, ED  ? ? ?EKG ?None ? ?Radiology ?MR BRAIN WO CONTRAST ? ?Result Date: 10/01/2021 ?CLINICAL DATA:  Dizziness, non-specific dizziness, x 1 week, PCP sent for MR EXAM: MRI HEAD WITHOUT CONTRAST TECHNIQUE: Multiplanar, multiecho pulse sequences of the brain and surrounding structures were obtained without intravenous contrast. COMPARISON:  CT head September 26, 2021. FINDINGS: Brain: No acute infarction, hemorrhage, hydrocephalus, extra-axial collection or mass lesion. Vascular: Major arterial flow voids are maintained at the skull base. Skull and upper cervical spine: Diffuse abnormal T1 hypointensity of the bone marrow with suggestion of DWI and FLAIR hyperintensity. Sinuses/Orbits: Clear sinuses.  No acute orbital findings. Other: No mastoid effusions. IMPRESSION: 1. No evidence of acute intracranial abnormality. 2. Diffusely abnormal marrow signal, probably the sequela of the patient's known sickle cell disease. Electronically Signed   By: Margaretha Sheffield M.D.   On: 10/01/2021 18:34   ? ?Procedures ?Procedures  ? ? ?Medications Ordered in ED ?Medications - No data to display ? ?ED Course/ Medical Decision Making/ A&P ?  ?                        ?Medical Decision Making ?Amount and/or Complexity of Data Reviewed ?Labs: ordered. ? ? ?11:56 PM ? ?41 yo male with pmh of sickle cell disease presenting for complaints of dizziness.   Patient is alert and oriented x3, no acute distress, afebrile, stable vital signs.  No neurovascular deficits on exam.  MRI ordered by triage process demonstrates no acute process.  No acute stroke.  On exam patie

## 2021-10-01 NOTE — ED Notes (Signed)
Orht ?

## 2021-10-02 ENCOUNTER — Other Ambulatory Visit: Payer: Self-pay

## 2021-10-02 ENCOUNTER — Other Ambulatory Visit (HOSPITAL_COMMUNITY): Payer: Self-pay

## 2021-10-02 MED ORDER — METHADONE HCL 10 MG PO TABS
30.0000 mg | ORAL_TABLET | Freq: Three times a day (TID) | ORAL | 0 refills | Status: DC
  Filled 2021-10-02: qty 270, 30d supply, fill #0

## 2021-10-02 MED ORDER — OXYCODONE HCL 30 MG PO TABS
30.0000 mg | ORAL_TABLET | Freq: Four times a day (QID) | ORAL | 0 refills | Status: DC | PRN
  Filled 2021-10-02: qty 120, 30d supply, fill #0

## 2021-10-08 ENCOUNTER — Other Ambulatory Visit (HOSPITAL_COMMUNITY): Payer: Self-pay

## 2021-10-08 DIAGNOSIS — H5500 Unspecified nystagmus: Secondary | ICD-10-CM | POA: Diagnosis not present

## 2021-10-08 DIAGNOSIS — R42 Dizziness and giddiness: Secondary | ICD-10-CM | POA: Diagnosis not present

## 2021-10-08 MED ORDER — IBUPROFEN 800 MG PO TABS
800.0000 mg | ORAL_TABLET | Freq: Three times a day (TID) | ORAL | 3 refills | Status: DC | PRN
  Filled 2021-10-08: qty 270, 90d supply, fill #0

## 2021-10-10 ENCOUNTER — Other Ambulatory Visit (HOSPITAL_COMMUNITY): Payer: Self-pay

## 2021-10-11 ENCOUNTER — Other Ambulatory Visit (HOSPITAL_COMMUNITY): Payer: Self-pay

## 2021-10-11 DIAGNOSIS — R42 Dizziness and giddiness: Secondary | ICD-10-CM | POA: Diagnosis not present

## 2021-10-11 DIAGNOSIS — D571 Sickle-cell disease without crisis: Secondary | ICD-10-CM | POA: Diagnosis not present

## 2021-10-11 MED ORDER — MECLIZINE HCL 25 MG PO TABS
25.0000 mg | ORAL_TABLET | Freq: Three times a day (TID) | ORAL | 0 refills | Status: DC | PRN
  Filled 2021-10-11: qty 30, 10d supply, fill #0

## 2021-10-12 DIAGNOSIS — L700 Acne vulgaris: Secondary | ICD-10-CM | POA: Diagnosis not present

## 2021-10-12 DIAGNOSIS — L663 Perifolliculitis capitis abscedens: Secondary | ICD-10-CM | POA: Diagnosis not present

## 2021-10-12 DIAGNOSIS — Z5181 Encounter for therapeutic drug level monitoring: Secondary | ICD-10-CM | POA: Diagnosis not present

## 2021-10-18 DIAGNOSIS — E119 Type 2 diabetes mellitus without complications: Secondary | ICD-10-CM | POA: Diagnosis not present

## 2021-10-18 DIAGNOSIS — D571 Sickle-cell disease without crisis: Secondary | ICD-10-CM | POA: Diagnosis not present

## 2021-10-29 ENCOUNTER — Other Ambulatory Visit (HOSPITAL_COMMUNITY): Payer: Self-pay

## 2021-10-29 MED ORDER — OXYCODONE HCL 30 MG PO TABS
30.0000 mg | ORAL_TABLET | Freq: Four times a day (QID) | ORAL | 0 refills | Status: DC | PRN
  Filled 2021-10-31: qty 20, 5d supply, fill #0
  Filled 2021-10-31: qty 100, 25d supply, fill #0

## 2021-10-29 MED ORDER — METHADONE HCL 10 MG PO TABS
30.0000 mg | ORAL_TABLET | Freq: Three times a day (TID) | ORAL | 0 refills | Status: DC
  Filled 2021-10-29 – 2021-10-31 (×2): qty 270, 30d supply, fill #0

## 2021-10-31 ENCOUNTER — Other Ambulatory Visit (HOSPITAL_COMMUNITY): Payer: Self-pay

## 2021-11-02 ENCOUNTER — Other Ambulatory Visit (HOSPITAL_COMMUNITY): Payer: Self-pay

## 2021-11-02 ENCOUNTER — Encounter (HOSPITAL_COMMUNITY): Payer: Self-pay | Admitting: Emergency Medicine

## 2021-11-02 ENCOUNTER — Ambulatory Visit (HOSPITAL_COMMUNITY)
Admission: EM | Admit: 2021-11-02 | Discharge: 2021-11-02 | Disposition: A | Discharge status: Home / Self Care | Payer: 59 | Attending: Family Medicine | Admitting: Family Medicine

## 2021-11-02 DIAGNOSIS — G9339 Other post infection and related fatigue syndromes: Secondary | ICD-10-CM | POA: Diagnosis not present

## 2021-11-02 DIAGNOSIS — J029 Acute pharyngitis, unspecified: Secondary | ICD-10-CM | POA: Insufficient documentation

## 2021-11-02 DIAGNOSIS — R112 Nausea with vomiting, unspecified: Secondary | ICD-10-CM | POA: Insufficient documentation

## 2021-11-02 DIAGNOSIS — R197 Diarrhea, unspecified: Secondary | ICD-10-CM | POA: Diagnosis not present

## 2021-11-02 LAB — POCT RAPID STREP A, ED / UC: Streptococcus, Group A Screen (Direct): NEGATIVE

## 2021-11-02 MED ORDER — ONDANSETRON HCL 4 MG/2ML IJ SOLN
INTRAMUSCULAR | Status: AC
  Filled 2021-11-02: qty 2

## 2021-11-02 MED ORDER — ONDANSETRON HCL 4 MG/2ML IJ SOLN
4.0000 mg | Freq: Once | INTRAMUSCULAR | Status: AC
  Administered 2021-11-02: 4 mg via INTRAMUSCULAR

## 2021-11-02 MED ORDER — POLYMYXIN B-TRIMETHOPRIM 10000-0.1 UNIT/ML-% OP SOLN
1.0000 [drp] | Freq: Four times a day (QID) | OPHTHALMIC | 0 refills | Status: DC
  Filled 2021-11-02: qty 10, 25d supply, fill #0

## 2021-11-02 NOTE — ED Provider Notes (Signed)
[facility]    CSN: [id number] Arrival date & time: 11/02/21  1412      History   Chief Complaint Chief Complaint  Patient presents with   Emesis   Fatigue    HPI Michael Mullins is a 41 y.o. male.   41 year old male presents with nausea, vomiting, fatigue.  Patient relates that his family has been going through episodes of nausea, vomiting, and diarrhea.  Patient relates that he his 2 children had the virus first couple weeks ago, then his wife had it, and now he started with a couple days ago.  He relates over the past 2 days he has been having sore throat, painful swallowing, sinus congestion, with rhinitis.  Patient relates the nausea vomiting and diarrhea started yesterday, it has been intermittent, but consistent.  Patient relates he has had some abdominal cramping, distention, and bloating.  Patient does relate he has some Zofran at home and he took 1 tablet early this morning about 9:00, this has helped relieve his symptoms and he has been able to maintain fluids when he drinks.  Patient has had mild fever, chills, body aches, and fatigue.  Patient relates also this morning that he has noticed that his eyes are red, and he has had matter in both the eyes that he has had to clear out.  He relates no vision changes.   Emesis Associated symptoms: cough, diarrhea and sore throat    Past Medical History:  Diagnosis Date   Diabetes mellitus without complication ([place])    Sickle cell anemia (HCC)     Patient Active Problem List   Diagnosis Date Noted   Sickle cell crisis ([place]) 05/30/2021   Acute respiratory failure with hypoxia (HCC) 05/26/2021   SOB (shortness of breath) 05/26/2021   HCAP (healthcare-associated pneumonia) 05/26/2021   Severe sepsis ([place]) 05/26/2021   Left ankle pain 05/26/2021   Pulmonary nodule 05/26/2021   Diabetes mellitus without complication ([place])    Sickle cell pain crisis ([place]) 01/23/2021   Ankle ulcer ([place]) 01/23/2021   Controlled type 2  diabetes mellitus with hyperglycemia ([place]) 01/23/2021   FOOT ULCER 01/14/2009   CHEST PAIN 01/14/2009    History reviewed. No pertinent surgical history.     Home Medications    Prior to Admission medications   Medication Sig Start Date End Date Taking? Authorizing Provider  trimethoprim-polymyxin b (POLYTRIM) ophthalmic solution Place 1 drop into both eyes every 6 (six) hours. 11/02/21  Yes Nyoka Lint, PA-C  ACCUTANE 40 MG capsule Take 40 mg by mouth daily. 04/30/21   [provider]  amoxicillin-clavulanate (AUGMENTIN) 875-125 MG tablet Take 1 tablet by mouth 2 (two) times daily for 7 days. 09/27/21     collagenase (SANTYL) ointment Apply to the affected area topically daily. Patient not taking: Reported on 06/15/2021 01/26/21   Dorena Dew, FNP  empagliflozin (JARDIANCE) 10 MG TABS tablet Take 1 tablet (10 mg total) by mouth daily. 11/03/20     ergocalciferol (VITAMIN D2) 1.25 MG (50000 UT) capsule Take 1 capsule by mouth every 30 days 08/07/20     ferrous sulfate (FEROSUL) 325 (65 FE) MG tablet Take by mouth. [id number]     folic acid (FOLVITE) 1 MG tablet Take 1 tablet by mouth daily Patient taking differently: Take 1 mg by mouth daily. 08/07/20     guaiFENesin-dextromethorphan (ROBITUSSIN DM) 100-10 MG/5ML syrup Take 10 mLs by mouth every 4 (four) hours as needed for cough. 05/28/21   Tresa Garter, MD  ibuprofen (ADVIL) 800 MG tablet Take 1 tablet (800 mg total) by mouth every 8 (eight) hours as needed for Pain or Fever. 10/08/21     lisinopril (ZESTRIL) 2.5 MG tablet Take 1 tablet (2.5 mg total) by mouth daily. 11/03/20     meclizine (ANTIVERT) 25 MG tablet Take 1 tablet (25 mg total) by mouth 3 (three)  times daily as needed. 10/11/21     methadone (DOLOPHINE) 10 MG tablet Take 3 tablets (30 mg total) by mouth 3 (three) times daily. 10/29/21     naloxone (NARCAN) nasal spray 4 mg/0.1 mL Use as directed every 2-3 minutes until emergency arrives, call 911 Patient not  taking: Reported on 05/25/2021 08/07/20     ondansetron (ZOFRAN) 4 MG tablet Take 1 tablet (4 mg total) by mouth every 8 (eight) hours as needed for Nausea / Vomiting. 02/22/21     OXBRYTA 500 MG TABS tablet Take 1,500 mg by mouth daily. 01/01/21   [provider]  oxycodone (ROXICODONE) 30 MG immediate release tablet Take 1 tablet by mouth every 6 hours as needed for Pain. 10/29/21     triamcinolone cream (KENALOG) 0.1 % Apply to affected skin of arms twice daily as needed. NEVER to face/neck/groin. 11/28/20     Vitamin D, Ergocalciferol, (DRISDOL) 1.25 MG (50000 UNIT) CAPS capsule Take 1 capsule by mouth every 30 days Patient taking differently: Take 50,000 Units by mouth every 30 (thirty) days. 05/24/21       Family History No family history on file.  Social History Social History   Tobacco Use   Smoking status: Never   Smokeless tobacco: Never  Substance Use Topics   Alcohol use: Not Currently   Drug use: Not Currently     Allergies   Morphine and Vancomycin   Review of Systems Review of Systems  HENT:  Positive for rhinorrhea, sinus pressure and sore throat.   Eyes:  Positive for discharge.  Respiratory:  Positive for cough.   Gastrointestinal:  Positive for diarrhea and vomiting.    Physical Exam Triage Vital Signs ED Triage Vitals  Enc Vitals Group     BP 11/02/21 1445 130/78     Pulse Rate 11/02/21 1445 89     Resp 11/02/21 1445 18     Temp 11/02/21 1445 99.1 F (37.3 C)     Temp Source 11/02/21 1445 Oral     SpO2 11/02/21 1445 97 %     Weight --      Height --      Head Circumference --      Peak Flow --      Pain Score 11/02/21 1442 5     Pain Loc --      Pain Edu? --      Excl. in [place]? --    No data found.  Updated Vital Signs BP 130/78 (BP Location: Right Arm)   Pulse 89   Temp 99.1 F (37.3 C) (Oral)   Resp 18   SpO2 97%   Visual Acuity Right Eye Distance:   Left Eye Distance:   Bilateral Distance:    Right Eye Near:   Left Eye  Near:    Bilateral Near:     Physical Exam Constitutional:      Appearance: Normal appearance.  HENT:     Right Ear: Tympanic membrane and ear canal normal.     Left Ear: Tympanic membrane and ear canal normal.     Mouth/Throat:     Mouth: Mucous  membranes are moist.     Pharynx: Posterior oropharyngeal erythema present. No pharyngeal swelling or oropharyngeal exudate.     Tonsils: No tonsillar exudate.  Eyes:     Comments: Bilateral eyes: Lids are normal upper and lower bilaterally.  The conjunctiva has mild redness bilaterally, and there is some mild mattering present bilaterally.  Cardiovascular:     Rate and Rhythm: Normal rate and regular rhythm.     Heart sounds: Normal heart sounds.  Pulmonary:     Effort: Pulmonary effort is normal.     Breath sounds: Normal breath sounds and air entry. No wheezing, rhonchi or rales.  Abdominal:     General: Abdomen is flat. Bowel sounds are normal.     Palpations: Abdomen is soft.     Tenderness: There is no abdominal tenderness. There is no guarding or rebound.  Lymphadenopathy:     Cervical: No cervical adenopathy.  Neurological:     Mental Status: He is alert.     UC Treatments / Results  Labs (all labs ordered are listed, but only abnormal results are displayed) Labs Reviewed  CULTURE, GROUP A STREP [facility])  POCT RAPID STREP A, ED / UC    EKG   Radiology No results found.  Procedures Procedures (including critical care time)  Medications Ordered in UC Medications  ondansetron (ZOFRAN) injection 4 mg (4 mg Intramuscular Given 11/02/21 1539)    Initial Impression / Assessment and Plan / UC Course  I have reviewed the triage vital signs and the nursing notes.  Pertinent labs & imaging results that were available during my care of the patient were reviewed by me and considered in my medical decision making (see chart for details).    Plan: 1.  Advised to continue the Zofran 4 mg 1 tablet every 6-8 hours as needed  for the nausea. 2.  Advised to continue fluid intake, mainly clear liquids, bland diet. 3.  Throat culture pending 4.  Advised patient to follow-up with PCP or return to urgent care if symptoms fail to improve over the next several days. Final Clinical Impressions(s) / UC Diagnoses   Final diagnoses:  Nausea and vomiting, unspecified vomiting type  Diarrhea, unspecified type  Other post infection and related fatigue syndromes  Acute pharyngitis, unspecified etiology     Discharge Instructions      Advised to continue fluids piquantly, clear liquids, Sprite, ginger ale, 7-Up, Gatorade, Pedialyte. Advised to take the Zofran 4 mg 1 every 6 hours as needed for the nausea. Advised to follow-up PCP or return to urgent care if symptoms fail to improve.    ED Prescriptions     Medication Sig Dispense Auth. Provider   trimethoprim-polymyxin b (POLYTRIM) ophthalmic solution Place 1 drop into both eyes every 6 (six) hours. 10 mL Nyoka Lint, PA-C      PDMP not reviewed this encounter.   Nyoka Lint, PA-C 11/02/21 1555

## 2021-11-02 NOTE — Discharge Instructions (Addendum)
Advised to continue fluids piquantly, clear liquids, Sprite, ginger ale, 7-Up, Gatorade, Pedialyte. Advised to take the Zofran 4 mg 1 every 6 hours as needed for the nausea. Advised to follow-up PCP or return to urgent care if symptoms fail to improve.

## 2021-11-02 NOTE — ED Triage Notes (Signed)
Pt states since Wed had body aches, fatigue, n/v/d, and chills. Reports slipped yesterday and hit nose on toilet yesterday. Took ibuprofen for fever/pain earlier as well as zofran for nausea.

## 2021-11-05 LAB — CULTURE, GROUP A STREP (THRC)

## 2021-11-07 ENCOUNTER — Emergency Department (HOSPITAL_COMMUNITY): Payer: 59

## 2021-11-07 ENCOUNTER — Emergency Department (HOSPITAL_COMMUNITY)
Admission: EM | Admit: 2021-11-07 | Discharge: 2021-11-07 | Disposition: A | Discharge status: Home / Self Care | Payer: PRIVATE HEALTH INSURANCE | Attending: Emergency Medicine | Admitting: Emergency Medicine

## 2021-11-07 DIAGNOSIS — W1830XA Fall on same level, unspecified, initial encounter: Secondary | ICD-10-CM | POA: Insufficient documentation

## 2021-11-07 DIAGNOSIS — Y9372 Activity, wrestling: Secondary | ICD-10-CM | POA: Insufficient documentation

## 2021-11-07 DIAGNOSIS — M79641 Pain in right hand: Secondary | ICD-10-CM
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
## 2021-11-07 NOTE — ED Notes (Signed)
Patient transported to X-ray 

## 2021-11-07 NOTE — Discharge Instructions (Signed)
Your Xray today did not show any evidence of fracture. I recommend that you rest, ice, put compression, and elevate hand to help swelling. You can take Ibuprofen or tylenol for your pain. If you continue to have problems with hand pain, please follow up with your PCP or you can always return to the Emergency Department.

## 2021-11-12 DIAGNOSIS — L663 Perifolliculitis capitis abscedens: Secondary | ICD-10-CM | POA: Diagnosis not present

## 2021-11-12 DIAGNOSIS — Z5181 Encounter for therapeutic drug level monitoring: Secondary | ICD-10-CM | POA: Diagnosis not present

## 2021-11-27 DIAGNOSIS — E6609 Other obesity due to excess calories: Secondary | ICD-10-CM | POA: Diagnosis not present

## 2021-11-27 DIAGNOSIS — N46023 Azoospermia due to obstruction of efferent ducts: Secondary | ICD-10-CM | POA: Diagnosis not present

## 2021-11-27 DIAGNOSIS — N5201 Erectile dysfunction due to arterial insufficiency: Secondary | ICD-10-CM | POA: Diagnosis not present

## 2021-11-27 DIAGNOSIS — Z6830 Body mass index (BMI) 30.0-30.9, adult: Secondary | ICD-10-CM | POA: Diagnosis not present

## 2021-11-27 DIAGNOSIS — N486 Induration penis plastica: Secondary | ICD-10-CM | POA: Diagnosis not present

## 2021-11-27 DIAGNOSIS — N3943 Post-void dribbling: Secondary | ICD-10-CM | POA: Diagnosis not present

## 2021-11-28 ENCOUNTER — Other Ambulatory Visit (HOSPITAL_BASED_OUTPATIENT_CLINIC_OR_DEPARTMENT_OTHER): Payer: Self-pay

## 2021-11-28 ENCOUNTER — Other Ambulatory Visit (HOSPITAL_COMMUNITY): Payer: Self-pay

## 2021-11-28 MED ORDER — METHADONE HCL 10 MG PO TABS
30.0000 mg | ORAL_TABLET | Freq: Three times a day (TID) | ORAL | 0 refills | Status: DC
  Filled 2021-11-28: qty 270, 30d supply, fill #0

## 2021-11-28 MED ORDER — OXYCODONE HCL 30 MG PO TABS
30.0000 mg | ORAL_TABLET | Freq: Four times a day (QID) | ORAL | 0 refills | Status: DC | PRN
  Filled 2021-11-28: qty 120, 30d supply, fill #0
  Filled 2021-11-30: qty 100, 25d supply, fill #0
  Filled 2021-11-30: qty 20, 5d supply, fill #0

## 2021-11-28 MED ORDER — FOLIC ACID 1 MG PO TABS
1.0000 mg | ORAL_TABLET | Freq: Every day | ORAL | 11 refills | Status: DC
  Filled 2021-11-28: qty 30, 30d supply, fill #0
  Filled 2021-12-25: qty 30, 30d supply, fill #1

## 2021-11-29 ENCOUNTER — Other Ambulatory Visit (HOSPITAL_COMMUNITY): Payer: Self-pay

## 2021-11-29 ENCOUNTER — Other Ambulatory Visit (HOSPITAL_BASED_OUTPATIENT_CLINIC_OR_DEPARTMENT_OTHER): Payer: Self-pay

## 2021-11-29 MED ORDER — LISINOPRIL 2.5 MG PO TABS
2.5000 mg | ORAL_TABLET | Freq: Every day | ORAL | 3 refills | Status: DC
  Filled 2021-11-29 – 2021-11-30 (×2): qty 90, 90d supply, fill #0

## 2021-11-29 MED ORDER — JARDIANCE 10 MG PO TABS
10.0000 mg | ORAL_TABLET | Freq: Every day | ORAL | 3 refills | Status: DC
  Filled 2021-11-29 – 2021-11-30 (×2): qty 90, 90d supply, fill #0

## 2021-11-30 ENCOUNTER — Other Ambulatory Visit (HOSPITAL_BASED_OUTPATIENT_CLINIC_OR_DEPARTMENT_OTHER): Payer: Self-pay

## 2021-11-30 ENCOUNTER — Other Ambulatory Visit (HOSPITAL_COMMUNITY): Payer: Self-pay

## 2021-12-03 ENCOUNTER — Other Ambulatory Visit (HOSPITAL_BASED_OUTPATIENT_CLINIC_OR_DEPARTMENT_OTHER): Payer: Self-pay

## 2021-12-06 ENCOUNTER — Other Ambulatory Visit (HOSPITAL_COMMUNITY): Payer: Self-pay

## 2021-12-06 DIAGNOSIS — D571 Sickle-cell disease without crisis: Secondary | ICD-10-CM | POA: Diagnosis not present

## 2021-12-06 DIAGNOSIS — E559 Vitamin D deficiency, unspecified: Secondary | ICD-10-CM | POA: Diagnosis not present

## 2021-12-06 DIAGNOSIS — G8929 Other chronic pain: Secondary | ICD-10-CM | POA: Diagnosis not present

## 2021-12-06 MED ORDER — ERGOCALCIFEROL 1.25 MG (50000 UT) PO CAPS
50000.0000 [IU] | ORAL_CAPSULE | ORAL | 11 refills | Status: DC
  Filled 2021-12-06 – 2021-12-18 (×2): qty 3, 90d supply, fill #0

## 2021-12-17 ENCOUNTER — Other Ambulatory Visit (HOSPITAL_COMMUNITY): Payer: Self-pay

## 2021-12-18 ENCOUNTER — Other Ambulatory Visit (HOSPITAL_COMMUNITY): Payer: Self-pay

## 2021-12-18 MED ORDER — HUMIRA-PS/UV/ADOL HS STARTER 40 MG/0.8ML ~~LOC~~ PNKT
PEN_INJECTOR | SUBCUTANEOUS | 0 refills | Status: DC
Start: 2021-12-17 — End: 2021-12-20

## 2021-12-18 MED ORDER — HUMIRA PEN 40 MG/0.8ML ~~LOC~~ PNKT: PEN_INJECTOR | SUBCUTANEOUS | 2 refills | Status: DC

## 2021-12-19 ENCOUNTER — Other Ambulatory Visit (HOSPITAL_COMMUNITY): Payer: Self-pay

## 2021-12-19 DIAGNOSIS — L663 Perifolliculitis capitis abscedens: Secondary | ICD-10-CM | POA: Diagnosis not present

## 2021-12-19 DIAGNOSIS — Z5181 Encounter for therapeutic drug level monitoring: Secondary | ICD-10-CM | POA: Diagnosis not present

## 2021-12-20 ENCOUNTER — Ambulatory Visit: Payer: 59 | Attending: Internal Medicine | Admitting: Pharmacist

## 2021-12-20 ENCOUNTER — Other Ambulatory Visit (HOSPITAL_COMMUNITY): Payer: Self-pay

## 2021-12-20 DIAGNOSIS — Z79899 Other long term (current) drug therapy: Secondary | ICD-10-CM

## 2021-12-20 MED ORDER — HUMIRA-PS/UV/ADOL HS STARTER 40 MG/0.8ML ~~LOC~~ PNKT
PEN_INJECTOR | SUBCUTANEOUS | 0 refills | Status: DC
  Filled 2021-12-20: qty 4, fill #0
  Filled 2021-12-24: qty 4, 28d supply, fill #0

## 2021-12-20 NOTE — Progress Notes (Signed)
  S: Patient presents for review of their specialty medication therapy.  Patient is about to start taking Humira for HS. Patient is managed by Dr. Coralie Carpen for this.   Adherence:   Efficacy:  Dosing: Hidradenitis suppurativa (Humira only): SubQ: Initial: 160 mg (given as four 40 mg injections on day 1 or given as two 40 mg injections per day over 2 consecutive days), then 80 mg 2 weeks later (day 15). Maintenance: 40 mg every week beginning day 29.  Dose adjustments: Renal: no dose adjustments (has not been studied) Hepatic: no dose adjustments (has not been studied)  Drug-drug interactions:none identified   Screening: TB test: completed per pt, negative  Hepatitis: completed per pt, negative   Monitoring:  S/sx of infection: none  CBC: monitored by his specialist. See CareEverywhere  S/sx of hypersensitivity: none  S/sx of malignancy: none S/sx of heart failure: none   Other side effects: none   O:     Lab Results  Component Value Date   WBC 11.4 (H) 10/01/2021   HGB 11.0 (L) 10/01/2021   HCT 31.4 (L) 10/01/2021   MCV 88.7 10/01/2021   PLT 377 10/01/2021      Chemistry      Component Value Date/Time   NA 138 10/01/2021 1850   K 4.1 10/01/2021 1850   CL 108 10/01/2021 1850   CO2 22 10/01/2021 1850   BUN 8 10/01/2021 1850   CREATININE 0.76 10/01/2021 1850      Component Value Date/Time   CALCIUM 9.3 10/01/2021 1850   ALKPHOS 62 09/26/2021 1544   AST 57 (H) 09/26/2021 1544   ALT 85 (H) 09/26/2021 1544   BILITOT 4.6 (H) 09/26/2021 1544       A/P: 1. Medication review: Patient is about to start on Humira for HS. Reviewed the medication with the patient, including the following: Humira is a TNF blocking agent indicated for ankylosing spondylitis, Crohn's disease, Hidradenitis suppurativa, psoriatic arthritis, plaque psoriasis, ulcerative colitis, and uveitis. Patient educated on purpose, proper use and potential adverse effects of Humira. Possible adverse  effects are increased risk of infections, headache, and injection site reactions. There is the possibility of an increased risk of malignancy but it is not well understood if this increased risk is due to there medication or the disease state. There are rare cases of pancytopenia and aplastic anemia. For SubQ injection at separate sites in the thigh or lower abdomen (avoiding areas within 2 inches of navel); rotate injection sites. May leave at room temperature for ~15 to 30 minutes prior to use; do not remove cap or cover while allowing product to reach room temperature. Do not use if solution is discolored or contains particulate matter. Do not administer to skin which is red, tender, bruised, hard, or that has scars, stretch marks, or psoriasis plaques. Needle cap of the prefilled syringe or needle cover for the adalimumab pen may contain latex. Prefilled pens and syringes are available for use by patients and the full amount of the syringe should be injected (self-administration); the vial is intended for institutional use only. Vials do not contain a preservative; discard unused portion. No recommendations for any changes at this time.   Butch Penny, PharmD, P[place]y [place], CPP Clinical Pharmacist [facility] [phone]

## 2021-12-24 ENCOUNTER — Other Ambulatory Visit: Payer: Self-pay | Admitting: Pharmacist

## 2021-12-24 ENCOUNTER — Other Ambulatory Visit (HOSPITAL_COMMUNITY): Payer: Self-pay

## 2021-12-24 ENCOUNTER — Other Ambulatory Visit: Payer: Self-pay

## 2021-12-24 MED ORDER — HUMIRA PEN 40 MG/0.8ML ~~LOC~~ PNKT
PEN_INJECTOR | SUBCUTANEOUS | 2 refills | Status: DC
  Filled 2021-12-24: qty 1, fill #0
  Filled 2022-01-16: qty 2, 28d supply, fill #0

## 2021-12-25 ENCOUNTER — Other Ambulatory Visit (HOSPITAL_COMMUNITY): Payer: Self-pay

## 2021-12-26 ENCOUNTER — Other Ambulatory Visit (HOSPITAL_COMMUNITY): Payer: Self-pay

## 2021-12-26 MED ORDER — OXYCODONE HCL 30 MG PO TABS
30.0000 mg | ORAL_TABLET | Freq: Four times a day (QID) | ORAL | 0 refills | Status: DC | PRN
  Filled 2021-12-26 – 2021-12-31 (×2): qty 120, 30d supply, fill #0

## 2021-12-26 MED ORDER — METHADONE HCL 10 MG PO TABS
30.0000 mg | ORAL_TABLET | Freq: Three times a day (TID) | ORAL | 0 refills | Status: DC
  Filled 2021-12-26 – 2021-12-31 (×2): qty 270, 30d supply, fill #0

## 2021-12-27 ENCOUNTER — Other Ambulatory Visit (HOSPITAL_COMMUNITY): Payer: Self-pay

## 2021-12-31 ENCOUNTER — Other Ambulatory Visit (HOSPITAL_COMMUNITY): Payer: Self-pay

## 2022-01-02 ENCOUNTER — Other Ambulatory Visit (HOSPITAL_COMMUNITY): Payer: Self-pay

## 2022-01-15 ENCOUNTER — Other Ambulatory Visit (HOSPITAL_COMMUNITY): Payer: Self-pay

## 2022-01-16 ENCOUNTER — Other Ambulatory Visit (HOSPITAL_COMMUNITY): Payer: Self-pay

## 2022-01-21 ENCOUNTER — Other Ambulatory Visit (HOSPITAL_COMMUNITY): Payer: Self-pay

## 2022-01-29 ENCOUNTER — Other Ambulatory Visit (HOSPITAL_COMMUNITY): Payer: Self-pay

## 2022-01-29 MED ORDER — METHADONE HCL 10 MG PO TABS
30.0000 mg | ORAL_TABLET | Freq: Three times a day (TID) | ORAL | 0 refills | Status: DC
  Filled 2022-01-29: qty 270, 30d supply, fill #0

## 2022-01-29 MED ORDER — OXYCODONE HCL 30 MG PO TABS
30.0000 mg | ORAL_TABLET | Freq: Four times a day (QID) | ORAL | 0 refills | Status: DC | PRN
  Filled 2022-01-29: qty 120, 30d supply, fill #0

## 2022-02-12 ENCOUNTER — Other Ambulatory Visit: Payer: Self-pay

## 2022-02-12 ENCOUNTER — Encounter (HOSPITAL_COMMUNITY): Payer: Self-pay | Admitting: Emergency Medicine

## 2022-02-12 ENCOUNTER — Other Ambulatory Visit (HOSPITAL_COMMUNITY): Payer: Self-pay

## 2022-02-12 ENCOUNTER — Emergency Department (HOSPITAL_COMMUNITY)
Admission: EM | Admit: 2022-02-12 | Discharge: 2022-02-12 | Disposition: A | Discharge status: Home / Self Care | Payer: 59 | Attending: Emergency Medicine | Admitting: Emergency Medicine

## 2022-02-12 DIAGNOSIS — H1032 Unspecified acute conjunctivitis, left eye: Secondary | ICD-10-CM | POA: Insufficient documentation

## 2022-02-12 MED ORDER — ERYTHROMYCIN 5 MG/GM OP OINT
TOPICAL_OINTMENT | OPHTHALMIC | 0 refills | Status: DC
  Filled 2022-02-12: qty 3.5, 10d supply, fill #0
  Filled 2022-02-12: qty 3.5, 7d supply, fill #0

## 2022-02-12 MED ORDER — FLUORESCEIN SODIUM 1 MG OP STRP
1.0000 | ORAL_STRIP | Freq: Once | OPHTHALMIC | Status: AC
  Administered 2022-02-12: 1 via OPHTHALMIC
  Filled 2022-02-12: qty 1

## 2022-02-12 MED ORDER — TETRACAINE HCL 0.5 % OP SOLN
1.0000 [drp] | Freq: Once | OPHTHALMIC | Status: AC
  Administered 2022-02-12: 1 [drp] via OPHTHALMIC
  Filled 2022-02-12: qty 4

## 2022-02-12 NOTE — ED Provider Notes (Signed)
[facility] EMERGENCY DEPARTMENT Provider Note   CSN: [id number] Arrival date & time: 02/12/22  0093     History  Chief Complaint  Patient presents with   Conjunctivitis    Michael Mullins is a 41 y.o. male.  41 year old male with no past medical history presents to the ED with a chief complaint of left eye redness, itchiness since yesterday.  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  He has been applying warm compresses to the area with some improvement in his symptoms.  Not have any upper respiratory symptoms at this time.  No sick contacts that he is aware of.  No fever, no pain with movement of his eye, no fevers, no trauma.  The history is provided by the patient and medical records.       Home Medications Prior to Admission medications   Medication Sig Start Date End Date Taking? Authorizing Provider  erythromycin ophthalmic ointment Place a 1/2 inch ribbon of ointment into the lower eyelid. 02/12/22  Yes Reco Shonk, PA-C  ACCUTANE 40 MG capsule Take 40 mg by mouth daily. 04/30/21   [provider]  Adalimumab (HUMIRA PEN) 40 MG/0.8ML PNKT Inject 0.8 mLs (40 mg total) into the skin every 14 days. 12/24/21   Quentin Angst, MD  Adalimumab (HUMIRA PEN-PS/UV/ADOL HS START) 40 MG/0.8ML PNKT Inject 80mg  (contents of 2 pens) under the skin on day 1; then inject 40mg  under the skin on day 8 and inject 40mg  under the skin on day 22 12/20/21   , MD  amoxicillin-clavulanate (AUGMENTIN) 875-125 MG tablet Take 1 tablet by mouth 2 (two) times daily for 7 days. 09/27/21     collagenase (SANTYL) ointment Apply to the affected area topically daily. Patient not taking: Reported on 06/15/2021 01/26/21   09/29/21, FNP  empagliflozin (JARDIANCE) 10 MG TABS tablet Take 1 tablet (10 mg total) by mouth daily. 11/29/21      ergocalciferol (VITAMIN D2) 1.25 MG (50000 UT) capsule Take 1 capsule by mouth every 30 days 08/07/20     ergocalciferol (VITAMIN D2) 1.25 MG (50000 UT) capsule Take 1 capsule (50,000 Units total) by mouth every 30 (thirty) days. 12/06/21     ferrous sulfate (FEROSUL) 325 (65 FE) MG tablet Take by mouth. 08/07/20     folic acid (FOLVITE) 1 MG tablet Take 1 tablet by mouth daily Patient taking differently: Take 1 mg by mouth daily. 08/07/20     folic acid (FOLVITE) 1 MG tablet Take 1 tablet (1 mg total) by mouth daily. 11/28/21     guaiFENesin-dextromethorphan (ROBITUSSIN DM) 100-10 MG/5ML syrup Take 10 mLs by mouth every 4 (four) hours as needed for cough. 05/28/21   08/09/20, MD  ibuprofen (ADVIL) 800 MG tablet Take 1 tablet (800 mg total) by mouth every 8 (eight) hours as needed for Pain or Fever. 10/08/21     lisinopril (ZESTRIL) 2.5 MG tablet Take 1 tablet (2.5 mg total) by mouth daily. 11/29/21     meclizine (ANTIVERT) 25 MG tablet Take 1 tablet (25 mg total) by mouth 3 (three)  times daily as needed. 10/11/21     methadone (DOLOPHINE) 10 MG tablet Take 3 tablets (30 mg total) by mouth 3 (three) times daily. 01/29/22     naloxone (NARCAN) nasal spray 4 mg/0.1 mL Use as directed  every 2-3 minutes until emergency arrives, call 911 Patient not taking: Reported on 05/25/2021 08/07/20     ondansetron (ZOFRAN) 4 MG tablet Take 1 tablet (4 mg total) by mouth every 8 (eight) hours as needed for Nausea / Vomiting. 02/22/21     OXBRYTA 500 MG TABS tablet Take 1,500 mg by mouth daily. 01/01/21   [provider]  oxycodone (ROXICODONE) 30 MG immediate release tablet Take 1 tablet (30 mg total) by mouth every 6 (six) hours as needed for pain 01/29/22     triamcinolone cream (KENALOG) 0.1 % Apply to affected skin of arms twice daily as needed. NEVER to face/neck/groin. 11/28/20     trimethoprim-polymyxin b (POLYTRIM) ophthalmic solution Place 1 drop into both eyes every 6 (six) hours. 11/02/21   Ellsworth Lennox, PA-C  Vitamin D, Ergocalciferol, (DRISDOL) 1.25 MG (50000 UNIT) CAPS capsule Take 1 capsule by mouth every 30 days Patient taking differently: Take 50,000 Units by mouth every 30 (thirty) days. 05/24/21         Allergies    Morphine and Vancomycin    Review of Systems   Review of Systems  Constitutional:  Negative for fever.  Eyes:  Positive for itching.    Physical Exam Updated Vital Signs BP 129/70 (BP Location: Right Arm)   Pulse 82   Temp 97.9 F (36.6 C) (Oral)   Resp 18   SpO2 93%  Physical Exam Vitals and nursing note reviewed.  Constitutional:      Appearance: Normal appearance.  HENT:     Head: Normocephalic and atraumatic.  Eyes:     General: Lids are normal.     Extraocular Movements:     Right eye: Normal extraocular motion and no nystagmus.     Left eye: Normal extraocular motion and no nystagmus.     Conjunctiva/sclera:     Right eye: Right conjunctiva is not injected. No chemosis, exudate or hemorrhage.    Left eye: Left conjunctiva is injected. No chemosis, exudate or hemorrhage.    Pupils: Pupils are equal.     Left eye: No fluorescein uptake.     Comments: Fluorescein exam performed by my attending Dr. Durwin Nora who reports no uptake.   Cardiovascular:     Rate and Rhythm: Normal rate.  Pulmonary:     Effort: Pulmonary effort is normal.  Abdominal:     General: Abdomen is flat.  Musculoskeletal:     Cervical back: Normal range of motion and neck supple.  Skin:    General: Skin is warm.  Neurological:     Mental Status: He is alert.     ED Results / Procedures / Treatments   Labs (all labs ordered are listed, but only abnormal results are displayed) Labs Reviewed - No data to display  EKG None  Radiology No results found.  Procedures Procedures    Medications Ordered in ED Medications  tetracaine (PONTOCAINE) 0.5 % ophthalmic solution 1 drop (1 drop Both Eyes Given by Other 02/12/22 0820)  fluorescein ophthalmic strip 1 strip  (1 strip Both Eyes Given 02/12/22 4580)    ED Course/ Medical Decision Making/ A&P                           Medical Decision Making Risk Prescription drug management.   Patient here with left eye itching, tearing, redness for the past day.  Has tried warm compresses without any improvement in symptoms.  No headache, no changes in  his vision, no loss of vision.  During evaluation bilateral eyes with normal EOMs, no drainage from either, Some drainage from the left eye, we discussed bacterial versus viral conjunctivitis.  Patient denies any sick contacts, no fevers, no upper respiratory infections.No pain with eye movement doubt periorbital versus orbital cellulitis. No fluorescein uptake to suggest abrasions versus ulceration. Will treat with symptomatic ointment such as erythromycin. Patient stable for discharge.    Portions of this note were generated with [employer]. Dictation errors may occur despite best attempts at proofreading.  Final Clinical Impression(s) / ED Diagnoses Final diagnoses:  Acute conjunctivitis of left eye, unspecified acute conjunctivitis type    Rx / DC Orders ED Discharge Orders          Ordered    erythromycin ophthalmic ointment        02/12/22 0838              Janeece Fitting, PA-C 02/12/22 [id number]    Godfrey Pick, MD 02/12/22 [phone]

## 2022-02-12 NOTE — Discharge Instructions (Signed)
You were prescribed antibiotic ointment to help with your left eye redness, please use this as prescribed.  If you experience any swelling, pain with eye movement or worsening symptoms please return to the ED.

## 2022-02-12 NOTE — ED Triage Notes (Signed)
Patient reports left itchy/red and teary eye onset yesterday , denies injury or vision loss.

## 2022-02-13 ENCOUNTER — Other Ambulatory Visit (HOSPITAL_COMMUNITY): Payer: Self-pay

## 2022-02-13 DIAGNOSIS — L663 Perifolliculitis capitis abscedens: Secondary | ICD-10-CM | POA: Diagnosis not present

## 2022-02-13 MED ORDER — CLOBETASOL PROPIONATE 0.05 % EX SOLN
1.0000 | Freq: Every day | CUTANEOUS | 5 refills | Status: DC
  Filled 2022-02-13: qty 50, 20d supply, fill #0

## 2022-02-18 ENCOUNTER — Other Ambulatory Visit (HOSPITAL_COMMUNITY): Payer: Self-pay

## 2022-02-19 ENCOUNTER — Other Ambulatory Visit (HOSPITAL_COMMUNITY): Payer: Self-pay

## 2022-02-20 ENCOUNTER — Other Ambulatory Visit (HOSPITAL_COMMUNITY): Payer: Self-pay

## 2022-02-20 ENCOUNTER — Other Ambulatory Visit: Payer: Self-pay | Admitting: Pharmacist

## 2022-02-20 MED ORDER — AMOXICILLIN 500 MG PO CAPS
500.0000 mg | ORAL_CAPSULE | Freq: Four times a day (QID) | ORAL | 0 refills | Status: DC
  Filled 2022-02-20: qty 28, 7d supply, fill #0

## 2022-02-20 MED ORDER — HUMIRA PEN 40 MG/0.8ML ~~LOC~~ PNKT
40.0000 mg | PEN_INJECTOR | SUBCUTANEOUS | 1 refills | Status: DC
  Filled 2022-02-20: qty 2, 28d supply, fill #0
  Filled 2022-04-02: qty 2, 28d supply, fill #1

## 2022-02-20 MED ORDER — IBUPROFEN 800 MG PO TABS
800.0000 mg | ORAL_TABLET | Freq: Three times a day (TID) | ORAL | 0 refills | Status: DC | PRN
  Filled 2022-02-20: qty 20, 7d supply, fill #0

## 2022-02-20 MED ORDER — HUMIRA PEN 40 MG/0.8ML ~~LOC~~ PNKT: 40.0000 mg | PEN_INJECTOR | SUBCUTANEOUS | 1 refills | Status: DC

## 2022-02-25 ENCOUNTER — Other Ambulatory Visit (HOSPITAL_COMMUNITY): Payer: Self-pay

## 2022-02-27 ENCOUNTER — Other Ambulatory Visit (HOSPITAL_COMMUNITY): Payer: Self-pay

## 2022-02-27 MED ORDER — METHADONE HCL 10 MG PO TABS
30.0000 mg | ORAL_TABLET | Freq: Three times a day (TID) | ORAL | 0 refills | Status: DC
  Filled 2022-02-27: qty 270, 30d supply, fill #0

## 2022-02-27 MED ORDER — OXYCODONE HCL 30 MG PO TABS
30.0000 mg | ORAL_TABLET | Freq: Four times a day (QID) | ORAL | 0 refills | Status: DC | PRN
  Filled 2022-02-27: qty 120, 30d supply, fill #0

## 2022-03-15 ENCOUNTER — Other Ambulatory Visit (HOSPITAL_COMMUNITY): Payer: Self-pay

## 2022-03-19 ENCOUNTER — Other Ambulatory Visit (HOSPITAL_COMMUNITY): Payer: Self-pay

## 2022-03-22 ENCOUNTER — Other Ambulatory Visit (HOSPITAL_COMMUNITY): Payer: Self-pay

## 2022-03-22 DIAGNOSIS — D571 Sickle-cell disease without crisis: Secondary | ICD-10-CM | POA: Diagnosis not present

## 2022-03-22 DIAGNOSIS — E1169 Type 2 diabetes mellitus with other specified complication: Secondary | ICD-10-CM | POA: Diagnosis not present

## 2022-03-22 MED ORDER — FREESTYLE INSULINX SYSTEM W/DEVICE KIT
PACK | 0 refills | Status: DC
  Filled 2022-03-22: qty 1, 30d supply, fill #0

## 2022-03-22 MED ORDER — GNP ALCOHOL SWABS 70 % PADS
MEDICATED_PAD | 4 refills | Status: DC
  Filled 2022-03-22: qty 100, 90d supply, fill #0

## 2022-03-22 MED ORDER — FREESTYLE LANCETS MISC
Freq: Every day | 4 refills | Status: AC
  Filled 2022-03-22: qty 100, 90d supply, fill #0

## 2022-03-22 MED ORDER — PRECISION QID TEST VI STRP
ORAL_STRIP | Freq: Every day | 4 refills | Status: AC
  Filled 2022-03-22: qty 50, 50d supply, fill #0

## 2022-03-25 ENCOUNTER — Other Ambulatory Visit (HOSPITAL_COMMUNITY): Payer: Self-pay

## 2022-03-25 DIAGNOSIS — G8929 Other chronic pain: Secondary | ICD-10-CM | POA: Diagnosis not present

## 2022-03-25 DIAGNOSIS — E559 Vitamin D deficiency, unspecified: Secondary | ICD-10-CM | POA: Diagnosis not present

## 2022-03-25 DIAGNOSIS — D571 Sickle-cell disease without crisis: Secondary | ICD-10-CM | POA: Diagnosis not present

## 2022-03-25 MED ORDER — ERGOCALCIFEROL 1.25 MG (50000 UT) PO CAPS
1.0000 | ORAL_CAPSULE | ORAL | 11 refills | Status: DC
  Filled 2022-03-25: qty 3, 90d supply, fill #0
  Filled 2022-12-15: qty 3, 90d supply, fill #1

## 2022-03-26 ENCOUNTER — Other Ambulatory Visit (HOSPITAL_COMMUNITY): Payer: Self-pay

## 2022-04-02 ENCOUNTER — Other Ambulatory Visit (HOSPITAL_COMMUNITY): Payer: Self-pay

## 2022-04-02 MED ORDER — METHADONE HCL 10 MG PO TABS
30.0000 mg | ORAL_TABLET | Freq: Three times a day (TID) | ORAL | 0 refills | Status: DC
  Filled 2022-04-02: qty 270, 30d supply, fill #0

## 2022-04-02 MED ORDER — OXYCODONE HCL 30 MG PO TABS
30.0000 mg | ORAL_TABLET | Freq: Four times a day (QID) | ORAL | 0 refills | Status: DC | PRN
  Filled 2022-04-02: qty 120, 30d supply, fill #0

## 2022-04-16 ENCOUNTER — Other Ambulatory Visit (HOSPITAL_COMMUNITY): Payer: Self-pay

## 2022-05-07 ENCOUNTER — Other Ambulatory Visit (HOSPITAL_COMMUNITY): Payer: Self-pay

## 2022-05-07 MED ORDER — OXYCODONE HCL 30 MG PO TABS
30.0000 mg | ORAL_TABLET | Freq: Four times a day (QID) | ORAL | 0 refills | Status: DC | PRN
  Filled 2022-05-07: qty 120, 30d supply, fill #0

## 2022-05-07 MED ORDER — METHADONE HCL 10 MG PO TABS
10.0000 mg | ORAL_TABLET | Freq: Three times a day (TID) | ORAL | 0 refills | Status: DC
  Filled 2022-05-07: qty 90, 30d supply, fill #0

## 2022-05-10 ENCOUNTER — Other Ambulatory Visit (HOSPITAL_COMMUNITY): Payer: Self-pay

## 2022-05-13 ENCOUNTER — Other Ambulatory Visit (HOSPITAL_COMMUNITY): Payer: Self-pay

## 2022-05-14 ENCOUNTER — Other Ambulatory Visit (HOSPITAL_COMMUNITY): Payer: Self-pay

## 2022-05-15 ENCOUNTER — Other Ambulatory Visit (HOSPITAL_COMMUNITY): Payer: Self-pay

## 2022-05-15 DIAGNOSIS — L663 Perifolliculitis capitis abscedens: Secondary | ICD-10-CM | POA: Diagnosis not present

## 2022-05-15 DIAGNOSIS — Z5181 Encounter for therapeutic drug level monitoring: Secondary | ICD-10-CM | POA: Diagnosis not present

## 2022-05-16 ENCOUNTER — Other Ambulatory Visit (HOSPITAL_COMMUNITY): Payer: Self-pay

## 2022-05-18 ENCOUNTER — Other Ambulatory Visit (HOSPITAL_COMMUNITY): Payer: Self-pay

## 2022-05-18 MED ORDER — HUMIRA PEN 40 MG/0.8ML ~~LOC~~ PNKT
0.8000 mL | PEN_INJECTOR | SUBCUTANEOUS | 1 refills | Status: DC
  Filled 2022-05-18: qty 2, 28d supply, fill #0

## 2022-05-20 ENCOUNTER — Other Ambulatory Visit (HOSPITAL_COMMUNITY): Payer: Self-pay

## 2022-05-20 ENCOUNTER — Other Ambulatory Visit: Payer: Self-pay

## 2022-05-20 ENCOUNTER — Other Ambulatory Visit: Payer: Self-pay | Admitting: Pharmacist

## 2022-05-20 MED ORDER — ADALIMUMAB 40 MG/0.8ML ~~LOC~~ AJKT
0.8000 mL | AUTO-INJECTOR | SUBCUTANEOUS | 1 refills | Status: DC
  Filled 2022-05-20: qty 2, fill #0
  Filled 2022-05-21: qty 2, 28d supply, fill #0
  Filled 2022-06-06 – 2022-06-11 (×2): qty 2, 28d supply, fill #1

## 2022-05-21 ENCOUNTER — Other Ambulatory Visit (HOSPITAL_COMMUNITY): Payer: Self-pay

## 2022-05-22 ENCOUNTER — Other Ambulatory Visit (HOSPITAL_COMMUNITY): Payer: Self-pay

## 2022-05-22 DIAGNOSIS — D5700 Hb-SS disease with crisis, unspecified: Secondary | ICD-10-CM | POA: Diagnosis not present

## 2022-05-30 ENCOUNTER — Other Ambulatory Visit (HOSPITAL_COMMUNITY): Payer: Self-pay

## 2022-05-30 MED ORDER — OXYCODONE HCL 30 MG PO TABS
30.0000 mg | ORAL_TABLET | Freq: Four times a day (QID) | ORAL | 0 refills | Status: DC | PRN
  Filled 2022-06-07: qty 120, 30d supply, fill #0

## 2022-05-30 MED ORDER — METHADONE HCL 10 MG PO TABS
10.0000 mg | ORAL_TABLET | Freq: Three times a day (TID) | ORAL | 0 refills | Status: DC
  Filled 2022-05-31: qty 90, 30d supply, fill #0

## 2022-05-31 ENCOUNTER — Other Ambulatory Visit (HOSPITAL_COMMUNITY): Payer: Self-pay

## 2022-06-06 ENCOUNTER — Other Ambulatory Visit (HOSPITAL_COMMUNITY): Payer: Self-pay

## 2022-06-07 ENCOUNTER — Other Ambulatory Visit (HOSPITAL_COMMUNITY): Payer: Self-pay

## 2022-06-11 ENCOUNTER — Other Ambulatory Visit (HOSPITAL_COMMUNITY): Payer: Self-pay

## 2022-06-13 ENCOUNTER — Other Ambulatory Visit (HOSPITAL_COMMUNITY): Payer: Self-pay

## 2022-06-17 ENCOUNTER — Other Ambulatory Visit: Payer: Self-pay

## 2022-06-18 ENCOUNTER — Other Ambulatory Visit (HOSPITAL_COMMUNITY): Payer: Self-pay

## 2022-06-18 ENCOUNTER — Other Ambulatory Visit: Payer: Self-pay

## 2022-06-19 ENCOUNTER — Other Ambulatory Visit (HOSPITAL_COMMUNITY): Payer: Self-pay

## 2022-06-20 ENCOUNTER — Other Ambulatory Visit: Payer: Self-pay

## 2022-06-20 ENCOUNTER — Other Ambulatory Visit (HOSPITAL_COMMUNITY): Payer: Self-pay

## 2022-06-24 ENCOUNTER — Other Ambulatory Visit (HOSPITAL_COMMUNITY): Payer: Self-pay

## 2022-06-25 ENCOUNTER — Other Ambulatory Visit (HOSPITAL_COMMUNITY): Payer: Self-pay

## 2022-07-01 DIAGNOSIS — D5700 Hb-SS disease with crisis, unspecified: Secondary | ICD-10-CM | POA: Diagnosis not present

## 2022-07-01 DIAGNOSIS — G8929 Other chronic pain: Secondary | ICD-10-CM | POA: Diagnosis not present

## 2022-07-01 DIAGNOSIS — Z79899 Other long term (current) drug therapy: Secondary | ICD-10-CM | POA: Diagnosis not present

## 2022-07-01 DIAGNOSIS — G8921 Chronic pain due to trauma: Secondary | ICD-10-CM | POA: Diagnosis not present

## 2022-07-09 ENCOUNTER — Other Ambulatory Visit: Payer: Self-pay | Admitting: Pharmacist

## 2022-07-09 ENCOUNTER — Other Ambulatory Visit (HOSPITAL_COMMUNITY): Payer: Self-pay

## 2022-07-09 MED ORDER — METHADONE HCL 10 MG PO TABS
10.0000 mg | ORAL_TABLET | Freq: Three times a day (TID) | ORAL | 0 refills | Status: DC
  Filled 2022-07-09: qty 90, 30d supply, fill #0

## 2022-07-09 MED ORDER — OXYCODONE HCL 30 MG PO TABS
30.0000 mg | ORAL_TABLET | Freq: Four times a day (QID) | ORAL | 0 refills | Status: DC | PRN
  Filled 2022-07-09: qty 120, 30d supply, fill #0

## 2022-07-09 MED ORDER — FOLIC ACID 1 MG PO TABS
1.0000 mg | ORAL_TABLET | Freq: Every day | ORAL | 3 refills | Status: DC
  Filled 2022-07-09: qty 90, 90d supply, fill #0
  Filled 2022-12-15: qty 90, 90d supply, fill #1
  Filled 2023-06-02: qty 90, 90d supply, fill #2

## 2022-07-09 MED ORDER — HUMIRA (2 PEN) 40 MG/0.8ML ~~LOC~~ AJKT
40.0000 mg | AUTO-INJECTOR | SUBCUTANEOUS | 0 refills | Status: DC
  Filled 2022-07-09: qty 2, 28d supply, fill #0

## 2022-07-09 MED ORDER — ERGOCALCIFEROL 1.25 MG (50000 UT) PO CAPS
50000.0000 [IU] | ORAL_CAPSULE | ORAL | 0 refills | Status: DC
  Filled 2022-07-09: qty 3, 90d supply, fill #0

## 2022-07-15 ENCOUNTER — Other Ambulatory Visit (HOSPITAL_COMMUNITY): Payer: Self-pay

## 2022-07-15 ENCOUNTER — Other Ambulatory Visit: Payer: Self-pay

## 2022-08-08 ENCOUNTER — Other Ambulatory Visit (HOSPITAL_COMMUNITY): Payer: Self-pay

## 2022-08-08 MED ORDER — METHADONE HCL 10 MG PO TABS
10.0000 mg | ORAL_TABLET | Freq: Three times a day (TID) | ORAL | 0 refills | Status: DC
  Filled 2022-08-08: qty 90, 30d supply, fill #0

## 2022-08-08 MED ORDER — OXYCODONE HCL 30 MG PO TABS
30.0000 mg | ORAL_TABLET | Freq: Four times a day (QID) | ORAL | 0 refills | Status: DC | PRN
  Filled 2022-08-08: qty 120, 30d supply, fill #0

## 2022-08-09 ENCOUNTER — Other Ambulatory Visit (HOSPITAL_COMMUNITY): Payer: Self-pay

## 2022-08-12 ENCOUNTER — Other Ambulatory Visit (HOSPITAL_COMMUNITY): Payer: Self-pay

## 2022-08-14 ENCOUNTER — Other Ambulatory Visit (HOSPITAL_COMMUNITY): Payer: Self-pay

## 2022-08-15 ENCOUNTER — Other Ambulatory Visit (HOSPITAL_COMMUNITY): Payer: Self-pay

## 2022-08-16 ENCOUNTER — Other Ambulatory Visit (HOSPITAL_COMMUNITY): Payer: Self-pay

## 2022-08-21 ENCOUNTER — Other Ambulatory Visit (HOSPITAL_COMMUNITY): Payer: Self-pay

## 2022-08-22 ENCOUNTER — Other Ambulatory Visit (HOSPITAL_COMMUNITY): Payer: Self-pay

## 2022-08-23 ENCOUNTER — Other Ambulatory Visit: Payer: Self-pay | Admitting: Pharmacist

## 2022-08-23 ENCOUNTER — Other Ambulatory Visit: Payer: Self-pay

## 2022-08-23 ENCOUNTER — Other Ambulatory Visit (HOSPITAL_COMMUNITY): Payer: Self-pay

## 2022-08-23 MED ORDER — HUMIRA (2 PEN) 40 MG/0.8ML ~~LOC~~ AJKT
AUTO-INJECTOR | SUBCUTANEOUS | 3 refills | Status: DC
  Filled 2022-08-23: qty 2, 28d supply, fill #0
  Filled 2022-09-16: qty 2, 28d supply, fill #1
  Filled 2022-10-09: qty 2, 28d supply, fill #2
  Filled 2022-11-05: qty 2, 28d supply, fill #3

## 2022-08-23 MED ORDER — HUMIRA (2 PEN) 40 MG/0.8ML ~~LOC~~ AJKT: AUTO-INJECTOR | SUBCUTANEOUS | 3 refills | Status: DC

## 2022-08-26 ENCOUNTER — Encounter (HOSPITAL_COMMUNITY): Payer: Self-pay

## 2022-08-26 ENCOUNTER — Other Ambulatory Visit: Payer: Self-pay

## 2022-08-26 ENCOUNTER — Emergency Department (HOSPITAL_COMMUNITY): Payer: Commercial Managed Care - PPO

## 2022-08-26 ENCOUNTER — Inpatient Hospital Stay (HOSPITAL_COMMUNITY)
Admission: EM | Admit: 2022-08-26 | Discharge: 2022-08-29 | DRG: 812 | Disposition: A | Discharge status: Home / Self Care | Payer: Commercial Managed Care - PPO | Attending: Internal Medicine | Admitting: Internal Medicine

## 2022-08-26 DIAGNOSIS — R0789 Other chest pain: Secondary | ICD-10-CM | POA: Diagnosis not present

## 2022-08-26 DIAGNOSIS — D638 Anemia in other chronic diseases classified elsewhere: Secondary | ICD-10-CM | POA: Diagnosis present

## 2022-08-26 DIAGNOSIS — E669 Obesity, unspecified: Secondary | ICD-10-CM | POA: Diagnosis present

## 2022-08-26 DIAGNOSIS — R059 Cough, unspecified: Secondary | ICD-10-CM | POA: Diagnosis not present

## 2022-08-26 DIAGNOSIS — R0602 Shortness of breath: Secondary | ICD-10-CM | POA: Diagnosis not present

## 2022-08-26 DIAGNOSIS — D5701 Hb-SS disease with acute chest syndrome: Principal | ICD-10-CM | POA: Diagnosis present

## 2022-08-26 DIAGNOSIS — R079 Chest pain, unspecified: Secondary | ICD-10-CM | POA: Diagnosis not present

## 2022-08-26 DIAGNOSIS — Z79899 Other long term (current) drug therapy: Secondary | ICD-10-CM

## 2022-08-26 DIAGNOSIS — E872 Acidosis, unspecified: Secondary | ICD-10-CM | POA: Diagnosis present

## 2022-08-26 DIAGNOSIS — I10 Essential (primary) hypertension: Secondary | ICD-10-CM | POA: Diagnosis not present

## 2022-08-26 DIAGNOSIS — R5081 Fever presenting with conditions classified elsewhere: Secondary | ICD-10-CM | POA: Diagnosis present

## 2022-08-26 DIAGNOSIS — E1165 Type 2 diabetes mellitus with hyperglycemia: Secondary | ICD-10-CM | POA: Diagnosis present

## 2022-08-26 DIAGNOSIS — Z6830 Body mass index (BMI) 30.0-30.9, adult: Secondary | ICD-10-CM | POA: Diagnosis not present

## 2022-08-26 DIAGNOSIS — R519 Headache, unspecified: Secondary | ICD-10-CM | POA: Diagnosis not present

## 2022-08-26 DIAGNOSIS — R0902 Hypoxemia: Secondary | ICD-10-CM | POA: Diagnosis present

## 2022-08-26 DIAGNOSIS — D5700 Hb-SS disease with crisis, unspecified: Secondary | ICD-10-CM | POA: Diagnosis not present

## 2022-08-26 DIAGNOSIS — F1120 Opioid dependence, uncomplicated: Secondary | ICD-10-CM | POA: Diagnosis not present

## 2022-08-26 LAB — COMPREHENSIVE METABOLIC PANEL
ALT: 22 U/L (ref 0–44)
AST: 43 U/L — ABNORMAL HIGH (ref 15–41)
Albumin: 4.1 g/dL (ref 3.5–5.0)
Alkaline Phosphatase: 57 U/L (ref 38–126)
Anion gap: 9 (ref 5–15)
BUN: 8 mg/dL (ref 6–20)
CO2: 21 mmol/L — ABNORMAL LOW (ref 22–32)
Calcium: 8.5 mg/dL — ABNORMAL LOW (ref 8.9–10.3)
Chloride: 105 mmol/L (ref 98–111)
Creatinine, Ser: 0.61 mg/dL (ref 0.61–1.24)
GFR, Estimated: >60 mL/min (ref >60)
Glucose, Bld: 155 mg/dL — ABNORMAL HIGH (ref 70–99)
Potassium: 3.6 mmol/L (ref 3.5–5.1)
Sodium: 135 mmol/L (ref 135–145)
Total Bilirubin: 6.8 mg/dL — ABNORMAL HIGH (ref 0.3–1.2)
Total Protein: 7.9 g/dL (ref 6.5–8.1)

## 2022-08-26 LAB — CBC WITH DIFFERENTIAL/PLATELET
Abs Immature Granulocytes: 0.08 10*3/uL — ABNORMAL HIGH (ref 0.00–0.07)
Basophils Absolute: 0 10*3/uL (ref 0.0–0.1)
Basophils Relative: 0 %
Eosinophils Absolute: 0.2 10*3/uL (ref 0.0–0.5)
Eosinophils Relative: 1 %
HCT: 25 % — ABNORMAL LOW (ref 39.0–52.0)
Hemoglobin: 9 g/dL — ABNORMAL LOW (ref 13.0–17.0)
Immature Granulocytes: 1 %
Lymphocytes Relative: 14 %
Lymphs Abs: 2.2 10*3/uL (ref 0.7–4.0)
MCH: 34.2 pg — ABNORMAL HIGH (ref 26.0–34.0)
MCHC: 36 g/dL (ref 30.0–36.0)
MCV: 95.1 fL (ref 80.0–100.0)
Monocytes Absolute: 2.3 10*3/uL — ABNORMAL HIGH (ref 0.1–1.0)
Monocytes Relative: 15 %
Neutro Abs: 11 10*3/uL — ABNORMAL HIGH (ref 1.7–7.7)
Neutrophils Relative %: 69 %
Platelets: 274 10*3/uL (ref 150–400)
RBC: 2.63 MIL/uL — ABNORMAL LOW (ref 4.22–5.81)
RDW: 26.2 % — ABNORMAL HIGH (ref 11.5–15.5)
WBC: 15.8 10*3/uL — ABNORMAL HIGH (ref 4.0–10.5)
nRBC: 4 % — ABNORMAL HIGH (ref 0.0–0.2)

## 2022-08-26 LAB — RETICULOCYTES
Immature Retic Fract: 40.5 % — ABNORMAL HIGH (ref 2.3–15.9)
RBC.: 2.56 MIL/uL — ABNORMAL LOW (ref 4.22–5.81)
Retic Count, Absolute: 611.5 10*3/uL — ABNORMAL HIGH (ref 19.0–186.0)
Retic Ct Pct: 22.6 % — ABNORMAL HIGH (ref 0.4–3.1)

## 2022-08-26 LAB — LACTIC ACID, PLASMA: Lactic Acid, Venous: 1.8 mmol/L (ref 0.5–1.9)

## 2022-08-26 MED ORDER — MELATONIN 3 MG PO TABS: 3.0000 mg | ORAL_TABLET | Freq: Every evening | ORAL | Status: DC | PRN

## 2022-08-26 MED ORDER — NALOXONE HCL 0.4 MG/ML IJ SOLN: 0.4000 mg | INTRAMUSCULAR | Status: DC | PRN

## 2022-08-26 MED ORDER — PROCHLORPERAZINE EDISYLATE 10 MG/2ML IJ SOLN: 5.0000 mg | Freq: Four times a day (QID) | INTRAMUSCULAR | Status: DC | PRN

## 2022-08-26 MED ORDER — INSULIN ASPART 100 UNIT/ML IJ SOLN
0.0000 [IU] | Freq: Three times a day (TID) | INTRAMUSCULAR | Status: DC
  Administered 2022-08-27 – 2022-08-28 (×2): 1 [IU] via SUBCUTANEOUS
  Administered 2022-08-29: 2 [IU] via SUBCUTANEOUS

## 2022-08-26 MED ORDER — SODIUM CHLORIDE 0.9 % IV SOLN
1.0000 g | Freq: Once | INTRAVENOUS | Status: AC
  Administered 2022-08-26: 1 g via INTRAVENOUS
  Filled 2022-08-26: qty 10

## 2022-08-26 MED ORDER — SODIUM CHLORIDE 0.9 % IV SOLN
2.0000 g | INTRAVENOUS | Status: DC
  Administered 2022-08-27 – 2022-08-28 (×2): 2 g via INTRAVENOUS
  Filled 2022-08-26 (×2): qty 20

## 2022-08-26 MED ORDER — SODIUM CHLORIDE 0.9 % IV BOLUS
1000.0000 mL | Freq: Once | INTRAVENOUS | Status: AC
  Administered 2022-08-26: 1000 mL via INTRAVENOUS

## 2022-08-26 MED ORDER — ACETAMINOPHEN 500 MG PO TABS
500.0000 mg | ORAL_TABLET | Freq: Four times a day (QID) | ORAL | Status: DC | PRN
  Administered 2022-08-27: 500 mg via ORAL
  Filled 2022-08-26: qty 1

## 2022-08-26 MED ORDER — HYDROMORPHONE HCL 1 MG/ML IJ SOLN
1.0000 mg | INTRAMUSCULAR | Status: DC | PRN
  Administered 2022-08-27 (×2): 1 mg via INTRAVENOUS
  Filled 2022-08-26 (×2): qty 1

## 2022-08-26 MED ORDER — DIPHENHYDRAMINE HCL 25 MG PO CAPS
25.0000 mg | ORAL_CAPSULE | ORAL | Status: DC | PRN
  Administered 2022-08-27 – 2022-08-28 (×2): 25 mg via ORAL
  Filled 2022-08-26 (×2): qty 1

## 2022-08-26 MED ORDER — POLYETHYLENE GLYCOL 3350 17 G PO PACK: 17.0000 g | PACK | Freq: Every day | ORAL | Status: DC | PRN

## 2022-08-26 MED ORDER — HYDROMORPHONE HCL 1 MG/ML IJ SOLN
1.0000 mg | Freq: Once | INTRAMUSCULAR | Status: AC
  Administered 2022-08-26: 1 mg via INTRAVENOUS
  Filled 2022-08-26: qty 1

## 2022-08-26 MED ORDER — ONDANSETRON HCL 4 MG/2ML IJ SOLN
4.0000 mg | Freq: Four times a day (QID) | INTRAMUSCULAR | Status: DC | PRN
  Administered 2022-08-27 – 2022-08-29 (×3): 4 mg via INTRAVENOUS
  Filled 2022-08-26 (×2): qty 2

## 2022-08-26 MED ORDER — VOXELOTOR 500 MG PO TABS
1500.0000 mg | ORAL_TABLET | Freq: Every day | ORAL | Status: DC
  Administered 2022-08-28 – 2022-08-29 (×2): 1500 mg via ORAL

## 2022-08-26 MED ORDER — SODIUM CHLORIDE 0.9 % IV SOLN
500.0000 mg | INTRAVENOUS | Status: DC
  Administered 2022-08-26 – 2022-08-29 (×3): 500 mg via INTRAVENOUS
  Filled 2022-08-26 (×3): qty 5

## 2022-08-26 MED ORDER — FOLIC ACID 1 MG PO TABS
1.0000 mg | ORAL_TABLET | Freq: Every day | ORAL | Status: DC
  Administered 2022-08-27 – 2022-08-29 (×3): 1 mg via ORAL
  Filled 2022-08-26 (×3): qty 1

## 2022-08-26 MED ORDER — ONDANSETRON HCL 4 MG/2ML IJ SOLN
4.0000 mg | Freq: Once | INTRAMUSCULAR | Status: AC
  Administered 2022-08-26: 4 mg via INTRAVENOUS
  Filled 2022-08-26: qty 2

## 2022-08-26 MED ORDER — SODIUM CHLORIDE 0.9% FLUSH: 9.0000 mL | INTRAVENOUS | Status: DC | PRN

## 2022-08-26 MED ORDER — HYDROMORPHONE 1 MG/ML IV SOLN
INTRAVENOUS | Status: DC
  Administered 2022-08-27: 1 mg via INTRAVENOUS
  Administered 2022-08-27: 30 mg via INTRAVENOUS
  Administered 2022-08-28: 1 mg via INTRAVENOUS
  Administered 2022-08-28: 1.5 mg via INTRAVENOUS
  Administered 2022-08-29: 0.5 mg via INTRAVENOUS
  Administered 2022-08-29: 0.1 mg via INTRAVENOUS
  Filled 2022-08-26: qty 30

## 2022-08-26 MED ORDER — OXYCODONE HCL 5 MG PO TABS: 15.0000 mg | ORAL_TABLET | Freq: Four times a day (QID) | ORAL | Status: DC | PRN

## 2022-08-26 MED ORDER — ENOXAPARIN SODIUM 40 MG/0.4ML IJ SOSY
40.0000 mg | PREFILLED_SYRINGE | INTRAMUSCULAR | Status: DC
  Administered 2022-08-27 – 2022-08-29 (×3): 40 mg via SUBCUTANEOUS
  Filled 2022-08-26 (×3): qty 0.4

## 2022-08-26 NOTE — H&P (Incomplete)
History and Physical  Michael Mullins [id number] DOB: 1980/08/14 DOA: 08/26/2022  Referring physician: Dr. Sabra Heck, [place]. PCP: Tawni Pummel, MD  Outpatient Specialists: Sickle cell team Patient coming from: Home.  Chief Complaint: Left leg pain, hurting all over, similar to prior sickle cell crisis.  HPI: Michael Mullins is a 42 y.o. male with medical history significant for sickle cell disease, opiate tolerance, hypertension, obesity who presented to [facility], ED from home due to concern for sickle cell pain crisis.  The patient reports last night he started having pain in his left lower extremity which migrated to his chest and into his upper arms bilaterally.  Symptoms are similar to prior sickle cell pain crisis.  He presented to the ED today for further evaluation.  In the ED, febrile with Tmax 100.4, hypoxic with O2 saturation of 88%.  Chest x-ray notable for right lower lobe infiltrates.  Due to concern for acute chest syndrome, EDP started IV antibiotics and IV opioid-based analgesics.  [facility], hospitalist service, was asked to admit.  The patient was admitted at [facility] stepdown unit for further management of his sickle cell crisis and acute chest syndrome.  ED Course: Tmax 100.4.  BP 114/68, pulse 68, respiratory 13, O2 saturation 93% on 2 L.  Lab studies notable for serum bicarb 21, glucose 155, AST 43, total bilirubin 6.8.  WBC 15.8, hemoglobin 9.0, platelet count 274.  Review of Systems: Review of systems as noted in the HPI. All other systems reviewed and are negative.   Past Medical History:  Diagnosis Date   Diabetes mellitus without complication (HCC)    Sickle cell anemia ([place])    History reviewed. No pertinent surgical history.  Social History:  reports that he has never smoked. He has never used smokeless tobacco. He reports that he does not currently use alcohol. He reports that he does not currently use drugs.   Allergies  Allergen  Reactions   Morphine Hives    Tolerates hydromorphone per MAR   Vancomycin Itching    Tolerates with longer infusion times and premedcation with IV bendryl    Family history: None reported.  Prior to Admission medications   Medication Sig Start Date End Date Taking? Authorizing Provider  ACCUTANE 40 MG capsule Take 40 mg by mouth daily. 04/30/21   [provider]  Adalimumab (HUMIRA, 2 PEN,) 40 MG/0.8ML PNKT Inject 40 mg into the skin every 14 (fourteen) days. 07/09/22   Jegede, Marlena Clipper, MD  Adalimumab (HUMIRA, 2 PEN,) 40 MG/0.8ML PNKT Inject 0.8 mL (40 mg total) under the skin every 14 (fourteen) days. 08/23/22   Tresa Garter, MD  amoxicillin (AMOXIL) 500 MG capsule Take 1 capsule (500 mg total) by mouth 4 (four) times daily until gone. 02/20/22     amoxicillin-clavulanate (AUGMENTIN) 875-125 MG tablet Take 1 tablet by mouth 2 (two) times daily for 7 days. 09/27/21     Blood Glucose Monitoring Suppl (FREESTYLE INSULINX SYSTEM) w/Device KIT Use meter to check blood sugar once daily 03/22/22     clobetasol (TEMOVATE) 0.05 % external solution Apply 1 Application topically daily to area on scalp. 02/13/22     collagenase (SANTYL) ointment Apply to the affected area topically daily. Patient not taking: Reported on 06/15/2021 01/26/21   Dorena Dew, FNP  empagliflozin (JARDIANCE) 10 MG TABS tablet Take 1 tablet (10 mg total) by mouth daily. 11/29/21     ergocalciferol (VITAMIN D2) 1.25 MG (50000 UT) capsule Take 1 capsule by  mouth every 30 days 08/07/20     ergocalciferol (VITAMIN D2) 1.25 MG (50000 UT) capsule Take 1 capsule (50,000 Units total) by mouth every 30 (thirty) days. 12/06/21     ergocalciferol (VITAMIN D2) 1.25 MG (50000 UT) capsule Take 1 capsule (50,000 Units total) by mouth every 30 (thirty) days. 03/25/22     ergocalciferol (VITAMIN D2) 1.25 MG (50000 UT) capsule Take 1 capsule (50,000 Units total) by mouth every 30 (thirty) days. 07/09/22   Ellouise Newer, PA-C  erythromycin ophthalmic ointment Place a 1/2 inch ribbon of ointment into the lower eyelid. 02/12/22   Janeece Fitting, PA-C  ferrous sulfate (FEROSUL) 325 (65 FE) MG tablet Take by mouth. [id number]     folic acid (FOLVITE) 1 MG tablet Take 1 tablet by mouth daily Patient taking differently: Take 1 mg by mouth daily. [id number]     folic acid (FOLVITE) 1 MG tablet Take 1 tablet (1 mg total) by mouth daily. [id number]     folic acid (FOLVITE) 1 MG tablet Take 1 tablet (1 mg total) by mouth daily. 07/09/22     glucose blood (PRECISION QID TEST) test strip Use 1 strip to check blood sugar daily. 03/22/22     GNP Alcohol Swabs 70 % PADS Use 1 swab as needed to clean site for blood sugar check. 03/22/22     guaiFENesin-dextromethorphan (ROBITUSSIN DM) 100-10 MG/5ML syrup Take 10 mLs by mouth every 4 (four) hours as needed for cough. 05/28/21   Tresa Garter, MD  ibuprofen (ADVIL) 800 MG tablet Take 1 tablet (800 mg total) by mouth every 8 (eight) hours as needed for Pain or Fever. 10/08/21     ibuprofen (ADVIL) 800 MG tablet Take 1 tablet (800 mg total) by mouth every 8 (eight) hours as needed. 02/20/22     Lancets (FREESTYLE) lancets Use 1 lancet to lance skin to check blood sugar once daily. 03/22/22     lisinopril (ZESTRIL) 2.5 MG tablet Take 1 tablet (2.5 mg total) by mouth daily. 11/29/21     meclizine (ANTIVERT) 25 MG tablet Take 1 tablet (25 mg total) by mouth 3 (three)  times daily as needed. 10/11/21     methadone (DOLOPHINE) 10 MG tablet Take 1 tablet (10 mg total) by mouth 3 (three) times daily. 08/08/22     naloxone (NARCAN) nasal spray 4 mg/0.1 mL Use as directed every 2-3 minutes until emergency arrives, call 911 Patient not taking: Reported on 05/25/2021 08/07/20     ondansetron (ZOFRAN) 4 MG tablet Take 1 tablet (4 mg total) by mouth every 8 (eight) hours as needed for Nausea / Vomiting. 02/22/21     OXBRYTA 500 MG TABS tablet Take 1,500 mg by mouth daily. 01/01/21   [provider]  oxycodone (ROXICODONE) 30 MG immediate release tablet Take 1 tablet by mouth every 6 hours as needed for Pain. 08/08/22     triamcinolone cream (KENALOG) 0.1 % Apply to affected skin of arms twice daily as needed. NEVER to face/neck/groin. 11/28/20     trimethoprim-polymyxin b (POLYTRIM) ophthalmic solution Place 1 drop into both eyes every 6 (six) hours. 11/02/21   Nyoka Lint, PA-C  Vitamin D, Ergocalciferol, (DRISDOL) 1.25 MG (50000 UNIT) CAPS capsule Take 1 capsule by mouth every 30 days Patient taking differently: Take 50,000 Units by mouth every 30 (thirty) days. 05/24/21       Physical Exam: BP 117/73   Pulse 83   Temp 98.4 F (36.9 C) (Oral)   Resp  16   Ht 5\' 11"  (1.803 m)   Wt 99.8 kg   SpO2 92%   BMI 30.68 kg/m   General: 42 y.o. year-old male well developed well nourished in no acute distress.  Alert and oriented x3.  Left cheek is hypertrophic, the patient has been there for years. Cardiovascular: Regular rate and rhythm with no rubs or gallops.  No thyromegaly or JVD noted.  No lower extremity edema. 2/4 pulses in all 4 extremities. Respiratory: Mild rales at bases.  No wheezing noted.  Poor inspiratory effort. Abdomen: Soft nontender nondistended with normal bowel sounds x4 quadrants. Muskuloskeletal: No cyanosis, clubbing or edema noted bilaterally Neuro: CN II-XII intact, strength, sensation, reflexes Skin: No ulcerative lesions noted or rashes Psychiatry: Judgement and insight appear normal. Mood is appropriate for condition and setting          Labs on Admission:  Basic Metabolic Panel: Recent Labs  Lab 08/26/22 2148  NA 135  K 3.6  CL 105  CO2 21*  GLUCOSE 155*  BUN 8  CREATININE 0.61  CALCIUM 8.5*   Liver Function Tests: Recent Labs  Lab 08/26/22 2148  AST 43*  ALT 22  ALKPHOS 57  BILITOT 6.8*  PROT 7.9  ALBUMIN 4.1   No results for input(s): "LIPASE", "AMYLASE" in the last 168 hours. No results for input(s): "AMMONIA" in the  last 168 hours. CBC: Recent Labs  Lab 08/26/22 2148  WBC 15.8*  NEUTROABS 11.0*  HGB 9.0*  HCT 25.0*  MCV 95.1  PLT 274   Cardiac Enzymes: No results for input(s): "CKTOTAL", "CKMB", "CKMBINDEX", "TROPONINI" in the last 168 hours.  BNP (last 3 results) No results for input(s): "BNP" in the last 8760 hours.  ProBNP (last 3 results) No results for input(s): "PROBNP" in the last 8760 hours.  CBG: No results for input(s): "GLUCAP" in the last 168 hours.  Radiological Exams on Admission: DG Chest 2 View  Result Date: 08/26/2022 CLINICAL DATA:  Cough, shortness of breath and sickle cell pain crisis. EXAM: CHEST - 2 VIEW COMPARISON:  September 26, 2021 FINDINGS: The heart size and mediastinal contours are within normal limits. Mild right basilar atelectasis and/or early infiltrate is seen. There is no evidence of a pleural effusion or pneumothorax. No acute osseous abnormalities are identified. IMPRESSION: Mild right basilar atelectasis and/or early infiltrate. Electronically Signed   By: Virgina Norfolk M.D.   On: 08/26/2022 22:55    EKG: I independently viewed the EKG done and my findings are as followed: Sinus rhythm rate of 93.  Nonspecific ST-T changes.  QTc 451.  Assessment/Plan Present on Admission:  Acute chest syndrome [facility])  Principal Problem:   Acute chest syndrome (HCC)  Acute chest syndrome Chest pain, hurting all over, fever, leukocytosis, hypoxia, right lower lobe infiltrates on chest x-ray. High-sensitivity troponin is pending. No evidence of acute ischemia on twelve-lead EKG Started Rocephin and IV azithromycin empirically in the ED, continue Monitor fever curve and WBC  Sickle cell pain crisis PCA pump Dilaudid When not on PCA pump, IV Dilaudid 1 mg every 2 hours as needed for severe pain Oxycodone 15 mg every 6 hours as needed for moderate pain Tylenol for mild pain. Bowel regimen in place to prevent opioid-induced constipation  Acute hypoxic respiratory  failure On presentation with O2 saturation of 88% Not on oxygen supplementation at baseline Currently on 2 L to maintain O2 saturation greater than 90% with Wean off oxygen supplementation at baseline Incentive spirometer DuoNebs every 2 hours as  needed  Elevated liver chemistries in the setting of sickle cell pain crisis AST 43, T. bili 6.8. Trend T. Bili  Mild non anion gap metabolic acidosis Serum bicarb 21, anion gap 9 Start gentle IV fluid hydration half-normal saline at 75 cc/h x 2 days.  Anemia of chronic disease in the setting of sickle cell disease Presented with hemoglobin of 9.0, this is baseline. No reported overt bleeding Monitor H&H for now  Hyperglycemia No prior history of diabetes Obtain hemoglobin A1c Start insulin sliding scale for hyperglycemia.  Obesity BMI 30 Recommend weight loss outpatient with regular physical activity and healthy dieting.    Critical care time: 65 minutes.    DVT prophylaxis: Subcu Lovenox daily  Code Status: Full code  Family Communication: Updated patient's fianc at bedside.  Disposition Plan: Admitted to stepdown unit at [place] called: None.  Admission status: Inpatient status.   Status is: Inpatient The patient requires at least 2 midnights for further evaluation and treatment of present condition.   Kayleen Memos MD [facility] Pager [phone]  If 7PM-7AM, please contact night-coverage [email or web address] Password [facility]  08/26/2022, 11:21 PM

## 2022-08-26 NOTE — ED Notes (Signed)
Pt SPO2 89-91% on RA, pt on put on 3 L of O2 and SPO2 came up to 95%.

## 2022-08-26 NOTE — ED Notes (Signed)
Pt given water. Currently on room air, sats 90-91%

## 2022-08-26 NOTE — ED Triage Notes (Signed)
Pt reports he thinks he is in a sickle cell crisis as he has had generalized pain since yesterday and oxycodone and ibuprofen are not helping.

## 2022-08-26 NOTE — ED Notes (Signed)
ED TO INPATIENT HANDOFF REPORT  ED Nurse Name and Phone #: Anette Guarneri Name/Age/Gender [place] Moots 42 y.o. male Room/Bed: APA09/APA09  Code Status   Code Status: Full Code  Home/SNF/Other Home Patient oriented to: self, place, time, and situation Is this baseline? Yes   Triage Complete: Triage complete  Chief Complaint Acute chest syndrome [facility]) [D57.01]  Triage Note Pt reports he thinks he is in a sickle cell crisis as he has had generalized pain since yesterday and oxycodone and ibuprofen are not helping.    Allergies Allergies  Allergen Reactions   Morphine Hives    Tolerates hydromorphone per MAR   Vancomycin Itching    Tolerates with longer infusion times and premedcation with IV bendryl    Level of Care/Admitting Diagnosis ED Disposition     ED Disposition  Admit   Condition  --   Comment  Hospital Area: [facility] [[id number]]  Level of Care: Stepdown [14]  Admit to SDU based on following criteria: Severe physiological/psychological symptoms:  Any diagnosis requiring assessment & intervention at least every 4 hours on an ongoing basis to obtain desired patient outcomes including stability and rehabilitation  May admit patient to [facility] or [facility] if equivalent level of care is available:: No  Covid Evaluation: Asymptomatic - no recent exposure (last 10 days) testing not required  Diagnosis: [id number]  Admitting Physician: Kayleen Memos [id number]  Attending Physician: Kayleen Memos [id number]  Certification:: I certify this patient will need inpatient services for at least 2 midnights  Estimated Length of Stay: 2          B Medical/Surgery History Past Medical History:  Diagnosis Date   Diabetes mellitus without complication ([place])    Sickle cell anemia ([place])    History reviewed. No pertinent surgical history.   A IV Location/Drains/Wounds Patient Lines/Drains/Airways Status     Active  Line/Drains/Airways     Name Placement date Placement time Site Days   Peripheral IV 08/26/22 18 G 1.88" Anterior;Right Forearm 08/26/22  2139  Forearm  less than 1   Wound / Incision (Open or Dehisced) 01/23/21 Ankle Anterior;Right full thickness 01/23/21  1300  Ankle  580            Intake/Output Last 24 hours  Intake/Output Summary (Last 24 hours) at 08/26/2022 2326 Last data filed at 08/26/2022 2312 Gross per 24 hour  Intake 1000 ml  Output --  Net 1000 ml    Labs/Imaging Results for orders placed or performed during the hospital encounter of 08/26/22 (from the past 48 hour(s))  Comprehensive metabolic panel     Status: Abnormal   Collection Time: 08/26/22  9:48 PM  Result Value Ref Range   Sodium 135 135 - 145 mmol/L   Potassium 3.6 3.5 - 5.1 mmol/L   Chloride 105 98 - 111 mmol/L   CO2 21 (L) 22 - 32 mmol/L   Glucose, Bld 155 (H) 70 - 99 mg/dL    Comment: Glucose reference range applies only to samples taken after fasting for at least 8 hours.   BUN 8 6 - 20 mg/dL   Creatinine, Ser 0.61 0.61 - 1.24 mg/dL   Calcium 8.5 (L) 8.9 - 10.3 mg/dL   Total Protein 7.9 6.5 - 8.1 g/dL   Albumin 4.1 3.5 - 5.0 g/dL   AST 43 (H) 15 - 41 U/L   ALT 22 0 - 44 U/L   Alkaline Phosphatase 57 38 -  126 U/L   Total Bilirubin 6.8 (H) 0.3 - 1.2 mg/dL   GFR, Estimated >60 >60 mL/min    Comment: (NOTE) Calculated using the CKD-EPI Creatinine Equation (2021)    Anion gap 9 5 - 15    Comment: Performed at [facility], [address] [place], [place] [address]  CBC with Differential     Status: Abnormal   Collection Time: 08/26/22  9:48 PM  Result Value Ref Range   WBC 15.8 (H) 4.0 - 10.5 K/uL    Comment: WHITE COUNT CONFIRMED ON SMEAR   RBC 2.63 (L) 4.22 - 5.81 MIL/uL   Hemoglobin 9.0 (L) 13.0 - 17.0 g/dL   HCT 25.0 (L) 39.0 - 52.0 %   MCV 95.1 80.0 - 100.0 fL   MCH 34.2 (H) 26.0 - 34.0 pg   MCHC 36.0 30.0 - 36.0 g/dL   RDW 26.2 (H) 11.5 - 15.5 %   Platelets 274 150 - 400 K/uL    nRBC 4.0 (H) 0.0 - 0.2 %   Neutrophils Relative % 69 %   Neutro Abs 11.0 (H) 1.7 - 7.7 K/uL   Lymphocytes Relative 14 %   Lymphs Abs 2.2 0.7 - 4.0 K/uL   Monocytes Relative 15 %   Monocytes Absolute 2.3 (H) 0.1 - 1.0 K/uL   Eosinophils Relative 1 %   Eosinophils Absolute 0.2 0.0 - 0.5 K/uL   Basophils Relative 0 %   Basophils Absolute 0.0 0.0 - 0.1 K/uL   WBC Morphology VACUOLATED NEUTROPHILS    RBC Morphology NUCLEATED RBCS PRESENT    Immature Granulocytes 1 %   Abs Immature Granulocytes 0.08 (H) 0.00 - 0.07 K/uL   Polychromasia PRESENT    Sickle Cells PRESENT    Target Cells PRESENT     Comment: Performed at [facility], [address] [place], [place] [address]  Reticulocytes     Status: Abnormal   Collection Time: 08/26/22  9:48 PM  Result Value Ref Range   Retic Ct Pct 22.6 (H) 0.4 - 3.1 %    Comment: RESULTS CONFIRMED BY MANUAL DILUTION   RBC. 2.56 (L) 4.22 - 5.81 MIL/uL   Retic Count, Absolute 611.5 (H) 19.0 - 186.0 K/uL   Immature Retic Fract 40.5 (H) 2.3 - 15.9 %    Comment: Performed at [facility], [address] [place], [place] [address]  Lactic acid, plasma     Status: None   Collection Time: 08/26/22  9:48 PM  Result Value Ref Range   Lactic Acid, Venous 1.8 0.5 - 1.9 mmol/L    Comment: Performed at [facility], [address] [place], [place] [address]  Blood culture (routine x 2)     Status: None (Preliminary result)   Collection Time: 08/26/22  9:48 PM   Specimen: Blood  Result Value Ref Range   Specimen Description BLOOD LEFT HAND    Special Requests      BOTTLES DRAWN AEROBIC AND ANAEROBIC Blood Culture results may not be optimal due to an inadequate volume of blood received in culture bottles Performed at [facility], [address] [place], [place] [address]    Culture PENDING    Report Status PENDING   Blood culture (routine x 2)     Status: None (Preliminary result)   Collection Time: 08/26/22 10:13 PM   Specimen: Blood  Result Value Ref  Range   Specimen Description BLOOD BLOOD RIGHT HAND    Special Requests      BOTTLES DRAWN AEROBIC AND ANAEROBIC  Blood Culture adequate volume Performed at [facility], [address] [place], [place] [address]    Culture PENDING    Report Status PENDING    DG Chest 2 View  Result Date: 08/26/2022 CLINICAL DATA:  Cough, shortness of breath and sickle cell pain crisis. EXAM: CHEST - 2 VIEW COMPARISON:  September 26, 2021 FINDINGS: The heart size and mediastinal contours are within normal limits. Mild right basilar atelectasis and/or early infiltrate is seen. There is no evidence of a pleural effusion or pneumothorax. No acute osseous abnormalities are identified. IMPRESSION: Mild right basilar atelectasis and/or early infiltrate. Electronically Signed   By: Virgina Norfolk M.D.   On: 08/26/2022 22:55    Pending Labs Unresulted Labs (From admission, onward)     Start     Ordered   09/02/22 0500  Creatinine, serum  (enoxaparin (LOVENOX)    CrCl >/= 30 ml/min)  Weekly,   R     Comments: while on enoxaparin therapy    08/26/22 2322   08/26/22 2322  HIV Antibody (routine testing w rflx)  (HIV Antibody (Routine testing w reflex) panel)  Once,   R        08/26/22 2322   08/26/22 2157  Urinalysis, Routine w reflex microscopic -Urine, Clean Catch  Once,   URGENT       Question:  Specimen Source  Answer:  Urine, Clean Catch   08/26/22 2156   08/26/22 2156  Lactic acid, plasma  Now then every 2 hours,   R (with STAT occurrences)      08/26/22 2156            Vitals/Pain Today's Vitals   08/26/22 2130 08/26/22 2312 08/26/22 2315 08/26/22 2316  BP: 123/65  117/73 117/73  Pulse: 87  83 83  Resp: 11  13 16   Temp:    98.4 F (36.9 C)  TempSrc:    Oral  SpO2: 94%  92% 92%  Weight:      Height:      PainSc:  7       Isolation Precautions No active isolations  Medications Medications  cefTRIAXone (ROCEPHIN) 1 g in sodium chloride 0.9 % 100 mL IVPB (1 g Intravenous New Bag/Given  08/26/22 2318)  azithromycin (ZITHROMAX) 500 mg in sodium chloride 0.9 % 250 mL IVPB (has no administration in time range)  enoxaparin (LOVENOX) injection 40 mg (has no administration in time range)  HYDROmorphone (DILAUDID) injection 1 mg (1 mg Intravenous Given 08/26/22 2212)  ondansetron (ZOFRAN) injection 4 mg (4 mg Intravenous Given 08/26/22 2212)  sodium chloride 0.9 % bolus 1,000 mL (0 mLs Intravenous Stopped 08/26/22 2312)  HYDROmorphone (DILAUDID) injection 1 mg (1 mg Intravenous Given 08/26/22 2313)    Mobility walks     Focused Assessments Cardiac Assessment Handoff:  Cardiac Rhythm: Sinus tachycardia  Lab Results  Component Value Date   DDIMER 2.96 (H) 05/26/2021   Does the Patient currently have chest pain? Yes    R Recommendations: See Admitting Provider Note  Report given to:   Additional Notes:  A&O x4. Typically ambulatory however having generalized pain and difficulty ambulating due to pain. Hx of sickle cell, c/o chest pain. Has been given 2mg  Dilaudid with some improvement. Newly oxygen dependent.

## 2022-08-26 NOTE — ED Notes (Signed)
Pt requesting US guided IV placement since being a hard stick

## 2022-08-26 NOTE — ED Notes (Signed)
Pt c/o generalized pain all over, reports pain is different than typical sickle cell pain.  Denies recent sickness, however multiple family members had GI virus

## 2022-08-26 NOTE — ED Notes (Signed)
ED Provider at bedside. 

## 2022-08-26 NOTE — ED Provider Notes (Signed)
[facility] Provider Note   CSN: [id number] Arrival date & time: 08/26/22  2100     History  Chief Complaint  Patient presents with   Sickle Cell Pain Crisis    [place] Mullins is a 42 y.o. male.   Sickle Cell Pain Crisis  This patient is a 42 year old male history of sickle cell anemia, also on Humira, presents with complaint of left leg pain that started yesterday followed by increasing pain all over his body increasing chest pain and a fever of 100.4 on arrival.  He was noted to be hypoxic to 89% on arrival and does not use oxygen.  The last time the patient was admitted to the hospital was for an acute chest syndrome and pneumonia approximately a year and a half ago.  He did take oxycodone yesterday but did not want to take anymore today because he does not like the way it makes him feel so he took Tylenol and ibuprofen which did not help.  No vomiting, no diarrhea, denies coughing at this time    Home Medications Prior to Admission medications   Medication Sig Start Date End Date Taking? Authorizing Provider  ACCUTANE 40 MG capsule Take 40 mg by mouth daily. 04/30/21   [provider]  Adalimumab (HUMIRA, 2 PEN,) 40 MG/0.8ML PNKT Inject 40 mg into the skin every 14 (fourteen) days. 07/09/22   Jegede, Marlena Clipper, MD  Adalimumab (HUMIRA, 2 PEN,) 40 MG/0.8ML PNKT Inject 0.8 mL (40 mg total) under the skin every 14 (fourteen) days. 08/23/22   Tresa Garter, MD  amoxicillin (AMOXIL) 500 MG capsule Take 1 capsule (500 mg total) by mouth 4 (four) times daily until gone. 02/20/22     amoxicillin-clavulanate (AUGMENTIN) 875-125 MG tablet Take 1 tablet by mouth 2 (two) times daily for 7 days. 09/27/21     Blood Glucose Monitoring Suppl (FREESTYLE INSULINX SYSTEM) w/Device KIT Use meter to check blood sugar once daily 03/22/22     clobetasol (TEMOVATE) 0.05 % external solution Apply 1 Application topically daily to area on scalp.  02/13/22     collagenase (SANTYL) ointment Apply to the affected area topically daily. Patient not taking: Reported on 06/15/2021 01/26/21   Dorena Dew, FNP  empagliflozin (JARDIANCE) 10 MG TABS tablet Take 1 tablet (10 mg total) by mouth daily. 11/29/21     ergocalciferol (VITAMIN D2) 1.25 MG (50000 UT) capsule Take 1 capsule by mouth every 30 days 08/07/20     ergocalciferol (VITAMIN D2) 1.25 MG (50000 UT) capsule Take 1 capsule (50,000 Units total) by mouth every 30 (thirty) days. 12/06/21     ergocalciferol (VITAMIN D2) 1.25 MG (50000 UT) capsule Take 1 capsule (50,000 Units total) by mouth every 30 (thirty) days. 03/25/22     ergocalciferol (VITAMIN D2) 1.25 MG (50000 UT) capsule Take 1 capsule (50,000 Units total) by mouth every 30 (thirty) days. 07/09/22   Ellouise Newer, PA-C  erythromycin ophthalmic ointment Place a 1/2 inch ribbon of ointment into the lower eyelid. 02/12/22   Janeece Fitting, PA-C  ferrous sulfate (FEROSUL) 325 (65 FE) MG tablet Take by mouth. [id number]     folic acid (FOLVITE) 1 MG tablet Take 1 tablet by mouth daily Patient taking differently: Take 1 mg by mouth daily. [id number]     folic acid (FOLVITE) 1 MG tablet Take 1 tablet (1 mg total) by mouth daily. [id number]     folic acid (FOLVITE) 1 MG tablet Take 1  tablet (1 mg total) by mouth daily. 07/09/22     glucose blood (PRECISION QID TEST) test strip Use 1 strip to check blood sugar daily. 03/22/22     GNP Alcohol Swabs 70 % PADS Use 1 swab as needed to clean site for blood sugar check. 03/22/22     guaiFENesin-dextromethorphan (ROBITUSSIN DM) 100-10 MG/5ML syrup Take 10 mLs by mouth every 4 (four) hours as needed for cough. 05/28/21   Tresa Garter, MD  ibuprofen (ADVIL) 800 MG tablet Take 1 tablet (800 mg total) by mouth every 8 (eight) hours as needed for Pain or Fever. 10/08/21     ibuprofen (ADVIL) 800 MG tablet Take 1 tablet (800 mg total) by mouth every 8 (eight) hours as needed. 02/20/22     Lancets  (FREESTYLE) lancets Use 1 lancet to lance skin to check blood sugar once daily. 03/22/22     lisinopril (ZESTRIL) 2.5 MG tablet Take 1 tablet (2.5 mg total) by mouth daily. 11/29/21     meclizine (ANTIVERT) 25 MG tablet Take 1 tablet (25 mg total) by mouth 3 (three)  times daily as needed. 10/11/21     methadone (DOLOPHINE) 10 MG tablet Take 1 tablet (10 mg total) by mouth 3 (three) times daily. 08/08/22     naloxone (NARCAN) nasal spray 4 mg/0.1 mL Use as directed every 2-3 minutes until emergency arrives, call 911 Patient not taking: Reported on 05/25/2021 08/07/20     ondansetron (ZOFRAN) 4 MG tablet Take 1 tablet (4 mg total) by mouth every 8 (eight) hours as needed for Nausea / Vomiting. 02/22/21     OXBRYTA 500 MG TABS tablet Take 1,500 mg by mouth daily. 01/01/21   [provider]  oxycodone (ROXICODONE) 30 MG immediate release tablet Take 1 tablet by mouth every 6 hours as needed for Pain. 08/08/22     triamcinolone cream (KENALOG) 0.1 % Apply to affected skin of arms twice daily as needed. NEVER to face/neck/groin. 11/28/20     trimethoprim-polymyxin b (POLYTRIM) ophthalmic solution Place 1 drop into both eyes every 6 (six) hours. 11/02/21   Nyoka Lint, PA-C  Vitamin D, Ergocalciferol, (DRISDOL) 1.25 MG (50000 UNIT) CAPS capsule Take 1 capsule by mouth every 30 days Patient taking differently: Take 50,000 Units by mouth every 30 (thirty) days. 05/24/21         Allergies    Morphine and Vancomycin    Review of Systems   Review of Systems  All other systems reviewed and are negative.   Physical Exam Updated Vital Signs BP 123/65   Pulse 87   Temp (!) 100.4 F (38 C) (Oral)   Resp 11   Ht 1.803 m (5\' 11" )   Wt 99.8 kg   SpO2 94%   BMI 30.68 kg/m  Physical Exam Vitals and nursing note reviewed.  Constitutional:      General: He is not in acute distress.    Appearance: He is well-developed.  HENT:     Head: Normocephalic and atraumatic.     Mouth/Throat:     Pharynx:  No oropharyngeal exudate.  Eyes:     General: Scleral icterus present.        Right eye: No discharge.        Left eye: No discharge.     Conjunctiva/sclera: Conjunctivae normal.     Pupils: Pupils are equal, round, and reactive to light.  Neck:     Thyroid: No thyromegaly.     Vascular: No JVD.  Cardiovascular:  Rate and Rhythm: Normal rate and regular rhythm.     Heart sounds: Normal heart sounds. No murmur heard.    No friction rub. No gallop.  Pulmonary:     Effort: Pulmonary effort is normal. No respiratory distress.     Breath sounds: Normal breath sounds. No wheezing or rales.  Abdominal:     General: Bowel sounds are normal. There is no distension.     Palpations: Abdomen is soft. There is no mass.     Tenderness: There is no abdominal tenderness.  Musculoskeletal:        General: Tenderness present. Normal range of motion.     Cervical back: Normal range of motion and neck supple.     Right lower leg: No edema.     Left lower leg: No edema.     Comments: Bilateral legs with tenderness with palpation and range of motion  Lymphadenopathy:     Cervical: No cervical adenopathy.  Skin:    General: Skin is warm and dry.     Findings: No erythema or rash.  Neurological:     Mental Status: He is alert.     Coordination: Coordination normal.  Psychiatric:        Behavior: Behavior normal.     ED Results / Procedures / Treatments   Labs (all labs ordered are listed, but only abnormal results are displayed) Labs Reviewed  COMPREHENSIVE METABOLIC PANEL - Abnormal; Notable for the following components:      Result Value   CO2 21 (*)    Glucose, Bld 155 (*)    Calcium 8.5 (*)    AST 43 (*)    Total Bilirubin 6.8 (*)    All other components within normal limits  CBC WITH DIFFERENTIAL/PLATELET - Abnormal; Notable for the following components:   WBC 15.8 (*)    RBC 2.63 (*)    Hemoglobin 9.0 (*)    HCT 25.0 (*)    MCH 34.2 (*)    RDW 26.2 (*)    nRBC 4.0 (*)     Neutro Abs 11.0 (*)    Monocytes Absolute 2.3 (*)    Abs Immature Granulocytes 0.08 (*)    All other components within normal limits  RETICULOCYTES - Abnormal; Notable for the following components:   Retic Ct Pct 22.6 (*)    RBC. 2.56 (*)    Retic Count, Absolute 611.5 (*)    Immature Retic Fract 40.5 (*)    All other components within normal limits  CULTURE, BLOOD (ROUTINE X 2)  CULTURE, BLOOD (ROUTINE X 2)  LACTIC ACID, PLASMA  LACTIC ACID, PLASMA  URINALYSIS, ROUTINE W REFLEX MICROSCOPIC    EKG EKG Interpretation  Date/Time:  Monday August 26 2022 21:14:30 EDT Ventricular Rate:  93 PR Interval:  150 QRS Duration: 91 QT Interval:  362 QTC Calculation: 451 R Axis:   15 Text Interpretation: Sinus rhythm Probable left atrial enlargement RSR' in V1 or V2, right VCD or RVH Probable anteroseptal infarct, old Confirmed by Noemi Chapel [id number]) on 08/26/2022 9:31:39 PM  Radiology DG Chest 2 View  Result Date: 08/26/2022 CLINICAL DATA:  Cough, shortness of breath and sickle cell pain crisis. EXAM: CHEST - 2 VIEW COMPARISON:  September 26, 2021 FINDINGS: The heart size and mediastinal contours are within normal limits. Mild right basilar atelectasis and/or early infiltrate is seen. There is no evidence of a pleural effusion or pneumothorax. No acute osseous abnormalities are identified. IMPRESSION: Mild right basilar atelectasis and/or early infiltrate. Electronically  Signed   By: Virgina Norfolk M.D.   On: 08/26/2022 22:55    Procedures .Critical Care  Performed by: Noemi Chapel, MD Authorized by: Noemi Chapel, MD   Critical care provider statement:    Critical care time (minutes):  30   Critical care time was exclusive of:  Separately billable procedures and treating other patients and teaching time   Critical care was necessary to treat or prevent imminent or life-threatening deterioration of the following conditions:  Respiratory failure (Acute chest syndrome)   Critical care was  time spent personally by me on the following activities:  Development of treatment plan with patient or surrogate, discussions with consultants, evaluation of patient's response to treatment, examination of patient, ordering and review of laboratory studies, ordering and review of radiographic studies, ordering and performing treatments and interventions, pulse oximetry, re-evaluation of patient's condition, review of old charts and obtaining history from patient or surrogate   I assumed direction of critical care for this patient from another provider in my specialty: no   Comments:           Medications Ordered in ED Medications  HYDROmorphone (DILAUDID) injection 1 mg (has no administration in time range)  cefTRIAXone (ROCEPHIN) 1 g in sodium chloride 0.9 % 100 mL IVPB (has no administration in time range)  azithromycin (ZITHROMAX) 500 mg in sodium chloride 0.9 % 250 mL IVPB (has no administration in time range)  HYDROmorphone (DILAUDID) injection 1 mg (1 mg Intravenous Given 08/26/22 2212)  ondansetron (ZOFRAN) injection 4 mg (4 mg Intravenous Given 08/26/22 2212)  sodium chloride 0.9 % bolus 1,000 mL (1,000 mLs Intravenous New Bag/Given 08/26/22 2212)    ED Course/ Medical Decision Making/ A&P                             Medical Decision Making Amount and/or Complexity of Data Reviewed Labs: ordered. Radiology: ordered. ECG/medicine tests: ordered.  Risk Prescription drug management. Decision regarding hospitalization.   This patient presents to the ED for concern of sickle cell crisis however I am significantly concerned for complications, this involves an extensive number of treatment options, and is a complaint that carries with it a high risk of complications and morbidity.  The differential diagnosis includes acute chest syndrome, pneumonia, less likely to be pulmonary embolism   Co morbidities that complicate the patient evaluation  Sickle cell anemia, immunocompromise,  diabetic   Additional history obtained:  Additional history obtained from electronic medical record External records from outside source obtained and reviewed including prior admissions and histories, the patient was seen at an outside emergency department in December for a crisis, last admitted to the hospital approximately 1-1/2 years ago   Lab Tests:  I Ordered, and personally interpreted labs.  The pertinent results include: Leukocytosis, blood cultures drawn, metabolic panel showing normal renal function, bilirubin of 6.8 which seems to be close to baseline if not just a little bit higher.  Renal function preserved, white blood cell count of close to 16,000 with a hemoglobin at baseline of 9, platelets of 274,000, reticulocytes are elevated appropriately and thankfully lactic acid was 1.8   Imaging Studies ordered:  I ordered imaging studies including chest x-ray I independently visualized and interpreted imaging which showed shows mild right basilar atelectasis or infiltrate I agree with the radiologist interpretation   Cardiac Monitoring: / EKG:  The patient was maintained on a cardiac monitor.  I personally viewed and interpreted the  cardiac monitored which showed an underlying rhythm of: Normal sinus rhythm   Consultations Obtained:  I requested consultation with the hospitalist,  and discussed lab and imaging findings as well as pertinent plan - they recommend: Admission   Problem List / ED Course / Critical interventions / Medication management  This patient presents with chest pain hypoxia fever and leukocytosis all consistent with having pneumonia or acute chest syndrome.  In the sickle cell patient this becomes a critical illness and the patient will need to be admitted, he is requiring 2 L of oxygen to keep him above 90%, antibiotics were also given I ordered medication including Rocephin and Zithromax for acute chest syndrome Reevaluation of the patient after these  medicines showed that the patient improved, improved even more after being given 2 mg of Dilaudid and a liter of IV fluids I have reviewed the patients home medicines and have made adjustments as needed   Social Determinants of Health:  Acutely ill from what appears to be sickle cell disease complication   Test / Admission - Considered:  Will admit to the hospital         Final Clinical Impression(s) / ED Diagnoses Final diagnoses:  Acute chest syndrome due to Hgb-S disease [facility])     Noemi Chapel, MD 08/26/22 2309

## 2022-08-27 ENCOUNTER — Inpatient Hospital Stay (HOSPITAL_COMMUNITY): Payer: Commercial Managed Care - PPO

## 2022-08-27 DIAGNOSIS — D5701 Hb-SS disease with acute chest syndrome: Secondary | ICD-10-CM | POA: Diagnosis not present

## 2022-08-27 LAB — LACTIC ACID, PLASMA: Lactic Acid, Venous: 0.9 mmol/L (ref 0.5–1.9)

## 2022-08-27 LAB — PHOSPHORUS: Phosphorus: 3.7 mg/dL (ref 2.5–4.6)

## 2022-08-27 LAB — URINALYSIS, ROUTINE W REFLEX MICROSCOPIC
Glucose, UA: NEGATIVE mg/dL
Hgb urine dipstick: NEGATIVE
Ketones, ur: NEGATIVE mg/dL
Leukocytes,Ua: NEGATIVE
Nitrite: NEGATIVE
Protein, ur: NEGATIVE mg/dL
Specific Gravity, Urine: 1.015 (ref 1.005–1.030)
pH: 6 (ref 5.0–8.0)

## 2022-08-27 LAB — GLUCOSE, CAPILLARY
Glucose-Capillary: 144 mg/dL — ABNORMAL HIGH (ref 70–99)
Glucose-Capillary: 107 mg/dL — ABNORMAL HIGH (ref 70–99)
Glucose-Capillary: 106 mg/dL — ABNORMAL HIGH (ref 70–99)
Glucose-Capillary: 125 mg/dL — ABNORMAL HIGH (ref 70–99)

## 2022-08-27 LAB — CBC
HCT: 22.9 % — ABNORMAL LOW (ref 39.0–52.0)
Hemoglobin: 8.4 g/dL — ABNORMAL LOW (ref 13.0–17.0)
MCH: 34.4 pg — ABNORMAL HIGH (ref 26.0–34.0)
MCHC: 36.7 g/dL — ABNORMAL HIGH (ref 30.0–36.0)
MCV: 93.9 fL (ref 80.0–100.0)
Platelets: 219 10*3/uL (ref 150–400)
RBC: 2.44 MIL/uL — ABNORMAL LOW (ref 4.22–5.81)
RDW: 24.2 % — ABNORMAL HIGH (ref 11.5–15.5)
WBC: 12.9 10*3/uL — ABNORMAL HIGH (ref 4.0–10.5)
nRBC: 1 % — ABNORMAL HIGH (ref 0.0–0.2)

## 2022-08-27 LAB — COMPREHENSIVE METABOLIC PANEL
ALT: 21 U/L (ref 0–44)
AST: 40 U/L (ref 15–41)
Albumin: 3.8 g/dL (ref 3.5–5.0)
Alkaline Phosphatase: 52 U/L (ref 38–126)
Anion gap: 7 (ref 5–15)
BUN: 8 mg/dL (ref 6–20)
CO2: 22 mmol/L (ref 22–32)
Calcium: 8.1 mg/dL — ABNORMAL LOW (ref 8.9–10.3)
Chloride: 105 mmol/L (ref 98–111)
Creatinine, Ser: 0.66 mg/dL (ref 0.61–1.24)
GFR, Estimated: >60 mL/min (ref >60)
Glucose, Bld: 132 mg/dL — ABNORMAL HIGH (ref 70–99)
Potassium: 3.9 mmol/L (ref 3.5–5.1)
Sodium: 134 mmol/L — ABNORMAL LOW (ref 135–145)
Total Bilirubin: 6.2 mg/dL — ABNORMAL HIGH (ref 0.3–1.2)
Total Protein: 7.1 g/dL (ref 6.5–8.1)

## 2022-08-27 LAB — HIV ANTIBODY (ROUTINE TESTING W REFLEX): HIV Screen 4th Generation wRfx: NONREACTIVE

## 2022-08-27 LAB — TROPONIN I (HIGH SENSITIVITY)
Troponin I (High Sensitivity): 5 ng/L (ref <18)
Troponin I (High Sensitivity): 4 ng/L (ref <18)

## 2022-08-27 LAB — MAGNESIUM: Magnesium: 1.9 mg/dL (ref 1.7–2.4)

## 2022-08-27 LAB — MRSA NEXT GEN BY PCR, NASAL: MRSA by PCR Next Gen: NOT DETECTED

## 2022-08-27 MED ORDER — ORAL CARE MOUTH RINSE: 15.0000 mL | OROMUCOSAL | Status: DC | PRN

## 2022-08-27 MED ORDER — SODIUM CHLORIDE 0.9 % IV SOLN: INTRAVENOUS | Status: DC

## 2022-08-27 MED ORDER — SODIUM CHLORIDE 0.45 % IV SOLN: INTRAVENOUS | Status: DC

## 2022-08-27 MED ORDER — ACETAMINOPHEN 325 MG PO TABS
650.0000 mg | ORAL_TABLET | Freq: Four times a day (QID) | ORAL | Status: DC | PRN
  Filled 2022-08-27: qty 2

## 2022-08-27 MED ORDER — KETOROLAC TROMETHAMINE 30 MG/ML IJ SOLN
15.0000 mg | Freq: Four times a day (QID) | INTRAMUSCULAR | Status: DC
  Administered 2022-08-27 – 2022-08-29 (×8): 15 mg via INTRAVENOUS
  Filled 2022-08-27 (×8): qty 1

## 2022-08-27 MED ORDER — ACETAMINOPHEN 500 MG PO TABS
1000.0000 mg | ORAL_TABLET | Freq: Four times a day (QID) | ORAL | Status: AC | PRN
  Administered 2022-08-27: 1000 mg via ORAL
  Filled 2022-08-27: qty 2

## 2022-08-27 MED ORDER — IPRATROPIUM-ALBUTEROL 0.5-2.5 (3) MG/3ML IN SOLN: 3.0000 mL | RESPIRATORY_TRACT | Status: DC | PRN

## 2022-08-27 MED ORDER — SODIUM CHLORIDE 0.9 % IV SOLN: INTRAVENOUS | Status: DC | PRN

## 2022-08-27 MED ORDER — CHLORHEXIDINE GLUCONATE CLOTH 2 % EX PADS
6.0000 | MEDICATED_PAD | Freq: Every day | CUTANEOUS | Status: DC
  Administered 2022-08-29: 6 via TOPICAL

## 2022-08-27 MED ORDER — PNEUMOCOCCAL VAC POLYVALENT 25 MCG/0.5ML IJ INJ: 0.5000 mL | INJECTION | INTRAMUSCULAR | Status: DC | PRN

## 2022-08-27 NOTE — Progress Notes (Signed)
Subjective: Michael Mullins is a 42 year old male with a medical history significant for sickle cell disease, opiate dependence and tolerance, hypertension, type 2 diabetes, and obesity was admitted for probable acute chest syndrome in the setting of sickle cell pain crisis. Today, patient states that he is not having any pain.  Pain improved on IV Dilaudid PCA.  Patient does not have any new complaints.  Oxygen saturation remains greater than 90 on 2 L supplemental oxygen.  Patient denies shortness of breath, dizziness, blurry vision, urinary symptoms, nausea, vomiting, or diarrhea.   Objective:  Vital signs in last 24 hours:  Vitals:   08/27/22 1500 08/27/22 1551 08/27/22 1600 08/27/22 1700  BP: (!) 145/57  133/88 (!) 106/57  Pulse: 80  86 87  Resp: 20 12 15 16   Temp:   98.6 F (37 C)   TempSrc:   Oral   SpO2: 95% 97% 97% 97%  Weight:      Height:        Intake/Output from previous day:   Intake/Output Summary (Last 24 hours) at 08/27/2022 1912 Last data filed at 08/27/2022 1841 Gross per 24 hour  Intake 1568.34 ml  Output 2030 ml  Net -461.66 ml    Physical Exam: General: Alert, awake, oriented x3, in no acute distress.  HEENT: [facility]/AT PEERL, EOMI Neck: Trachea midline,  no masses, no thyromegal,y no JVD, no carotid bruit OROPHARYNX:  Moist, No exudate/ erythema/lesions.  Heart: Regular rate and rhythm, without murmurs, rubs, gallops, PMI non-displaced, no heaves or thrills on palpation.  Lungs: Clear to auscultation, no wheezing or rhonchi noted. No increased vocal fremitus resonant to percussion  Abdomen: Soft, nontender, nondistended, positive bowel sounds, no masses no hepatosplenomegaly noted..  Neuro: No focal neurological deficits noted cranial nerves II through XII grossly intact. DTRs 2+ bilaterally upper and lower extremities. Strength 5 out of 5 in bilateral upper and lower extremities. Musculoskeletal: No warm swelling or erythema around joints, no spinal tenderness  noted. Psychiatric: Patient alert and oriented x3, good insight and cognition, good recent to remote recall. Lymph node survey: No cervical axillary or inguinal lymphadenopathy noted.  Lab Results:  Basic Metabolic Panel:    Component Value Date/Time   NA 134 (L) 08/27/2022 0552   K 3.9 08/27/2022 0552   CL 105 08/27/2022 0552   CO2 22 08/27/2022 0552   BUN 8 08/27/2022 0552   CREATININE 0.66 08/27/2022 0552   GLUCOSE 132 (H) 08/27/2022 0552   CALCIUM 8.1 (L) 08/27/2022 0552   CBC:    Component Value Date/Time   WBC 12.9 (H) 08/27/2022 0552   HGB 8.4 (L) 08/27/2022 0552   HCT 22.9 (L) 08/27/2022 0552   PLT 219 08/27/2022 0552   MCV 93.9 08/27/2022 0552   NEUTROABS 11.0 (H) 08/26/2022 2148   LYMPHSABS 2.2 08/26/2022 2148   MONOABS 2.3 (H) 08/26/2022 2148   EOSABS 0.2 08/26/2022 2148   BASOSABS 0.0 08/26/2022 2148    Recent Results (from the past 240 hour(s))  Blood culture (routine x 2)     Status: None (Preliminary result)   Collection Time: 08/26/22  9:48 PM   Specimen: BLOOD LEFT HAND  Result Value Ref Range Status   Specimen Description   Final    BLOOD LEFT HAND Performed at [facility], [address], [place], [place] [address]    Special Requests   Final    BOTTLES DRAWN AEROBIC AND ANAEROBIC Blood Culture results may not be optimal due to an inadequate volume of blood received  in culture bottles Performed at [facility], [address] [place], [place] [address]    Culture   Final    NO GROWTH < 12 HOURS Performed at [facility] [address] [place], [place] [address]    Report Status PENDING  Incomplete  Blood culture (routine x 2)     Status: None (Preliminary result)   Collection Time: 08/26/22 10:13 PM   Specimen: BLOOD  Result Value Ref Range Status   Specimen Description   Final    BLOOD BLOOD RIGHT HAND Performed at [facility], [address] [place], [place] [address]    Special Requests   Final    BOTTLES DRAWN AEROBIC AND  ANAEROBIC Blood Culture adequate volume Performed at [facility], [address], [place], [place] [address]    Culture   Final    NO GROWTH < 12 HOURS Performed at [facility], [place] [address], [place], [place] [address]    Report Status PENDING  Incomplete  MRSA Next Gen by PCR, Nasal     Status: None   Collection Time: 08/27/22  7:02 AM   Specimen: Nasal Mucosa; Nasal Swab  Result Value Ref Range Status   MRSA by PCR Next Gen NOT DETECTED NOT DETECTED Final    Comment: (NOTE) The GeneXpert MRSA Assay (FDA approved for NASAL specimens only), is one component of a comprehensive MRSA colonization surveillance program. It is not intended to diagnose MRSA infection nor to guide or monitor treatment for MRSA infections. Test performance is not FDA approved in patients less than 55 years old. Performed at [facility], [place] [address], [place], [place] [address]     Studies/Results: DG Chest 2 View  Result Date: 08/26/2022 CLINICAL DATA:  Cough, shortness of breath and sickle cell pain crisis. EXAM: CHEST - 2 VIEW COMPARISON:  September 26, 2021 FINDINGS: The heart size and mediastinal contours are within normal limits. Mild right basilar atelectasis and/or early infiltrate is seen. There is no evidence of a pleural effusion or pneumothorax. No acute osseous abnormalities are identified. IMPRESSION: Mild right basilar atelectasis and/or early infiltrate. Electronically Signed   By: Virgina Norfolk M.D.   On: 08/26/2022 22:55    Medications: Scheduled Meds:  Chlorhexidine Gluconate Cloth  6 each Topical Daily   enoxaparin (LOVENOX) injection  40 mg Subcutaneous [id number]   folic acid  1 mg Oral Daily   HYDROmorphone   Intravenous Q4H   insulin aspart  0-9 Units Subcutaneous TID WC   ketorolac  15 mg Intravenous Q6H   voxelotor  1,500 mg Oral Daily   Continuous Infusions:  sodium chloride 10 mL/hr at 08/27/22 1557   azithromycin Stopped (08/27/22 0329)    cefTRIAXone (ROCEPHIN)  IV     PRN Meds:.diphenhydrAMINE, HYDROmorphone (DILAUDID) injection, ipratropium-albuterol, melatonin, naloxone **AND** sodium chloride flush, ondansetron (ZOFRAN) IV, mouth rinse, oxycodone, pneumococcal 23 valent vaccine, polyethylene glycol, prochlorperazine  Consultants: None  Procedures: None  Antibiotics: Ceftriaxone and azithromycin    Assessment/Plan: Principal Problem:   Acute chest syndrome (HCC) Acute chest syndrome: Chest pain resolved.  Hypoxia improved.  Oxygen saturation greater than 90% on 2 L. Continue Rocephin and IV azithromycin, will consider de-escalating in a.m. Patient afebrile.  Tylenol 500 mg every 6 hours as needed  Sickle cell pain crisis: Pain intensity improved overnight.  Will continue IV Dilaudid PCA Continue home medications, oxycodone 15 mg every 6 hours as needed for moderate to severe pain  Acute hypoxia with respiratory  failure: Resolved.  Oxygen saturation greater than 90% on 2 L.  Titrate oxygen as tolerated  Anemia of chronic disease: Patient's hemoglobin is stable and consistent with his baseline.  There is no clinical indication for blood transfusion at this time.  Continue to monitor closely.  Leukocytosis: Will continue IV antibiotics.  Patient afebrile.  Monitor closely.  Labs in AM.  Type 2 diabetes mellitus: Continue SSI.  Hold Jardiance.  Essential hypertension: Stable.  Continue home medication.    Code Status: Full Code Family Communication: N/A Disposition Plan: Not yet ready for discharge   [place], MSN, FNP-C Patient [place] [address] [place], [place] [address] [phone]  If 7PM-7AM, please contact night-coverage.  08/27/2022, 7:12 PM  LOS: 1 day

## 2022-08-27 NOTE — TOC Progression Note (Signed)
  Transition of Care [facility]) Screening Note   Patient Details  Name: Michael Mullins Date of Birth: [birth year]   Transition of Care [facility]) CM/SW Contact:    Shade Flood, LCSW Phone Number: 08/27/2022, 10:06 AM    Transition of Care Department [facility]) has reviewed patient and no TOC needs have been identified at this time. We will continue to monitor patient advancement through interdisciplinary progression rounds. If new patient transition needs arise, please place a TOC consult.

## 2022-08-27 NOTE — Progress Notes (Signed)
PT Cancellation Note  Patient Details Name: Michael Mullins MRN: [id number] DOB: 1981/05/31   Cancelled Treatment:    Reason Eval/Treat Not Completed: Medical issues which prohibited therapy, RN reports PCA recently available. Will check back when pain  better controlled.  [place] Office [phone] Weekend [phone]    Claretha Cooper 08/27/2022, 10:34 AM

## 2022-08-28 LAB — CBC
HCT: 22.9 % — ABNORMAL LOW (ref 39.0–52.0)
Hemoglobin: 8.1 g/dL — ABNORMAL LOW (ref 13.0–17.0)
MCH: 34.2 pg — ABNORMAL HIGH (ref 26.0–34.0)
MCHC: 35.4 g/dL (ref 30.0–36.0)
MCV: 96.6 fL (ref 80.0–100.0)
Platelets: 183 10*3/uL (ref 150–400)
RBC: 2.37 MIL/uL — ABNORMAL LOW (ref 4.22–5.81)
RDW: 23.2 % — ABNORMAL HIGH (ref 11.5–15.5)
WBC: 9 10*3/uL (ref 4.0–10.5)
nRBC: 5.6 % — ABNORMAL HIGH (ref 0.0–0.2)

## 2022-08-28 LAB — HEMOGLOBIN A1C
Hgb A1c MFr Bld: 4.4 % — ABNORMAL LOW (ref 4.8–5.6)
Mean Plasma Glucose: 80 mg/dL

## 2022-08-28 LAB — BASIC METABOLIC PANEL
Anion gap: 8 (ref 5–15)
BUN: 12 mg/dL (ref 6–20)
CO2: 22 mmol/L (ref 22–32)
Calcium: 8.1 mg/dL — ABNORMAL LOW (ref 8.9–10.3)
Chloride: 107 mmol/L (ref 98–111)
Creatinine, Ser: 0.71 mg/dL (ref 0.61–1.24)
GFR, Estimated: >60 mL/min (ref >60)
Glucose, Bld: 109 mg/dL — ABNORMAL HIGH (ref 70–99)
Potassium: 3.8 mmol/L (ref 3.5–5.1)
Sodium: 137 mmol/L (ref 135–145)

## 2022-08-28 LAB — GLUCOSE, CAPILLARY
Glucose-Capillary: 100 mg/dL — ABNORMAL HIGH (ref 70–99)
Glucose-Capillary: 122 mg/dL — ABNORMAL HIGH (ref 70–99)
Glucose-Capillary: 100 mg/dL — ABNORMAL HIGH (ref 70–99)

## 2022-08-28 NOTE — Progress Notes (Signed)
PT Cancellation Note  Patient Details Name: DANTEZ VERDUCCI MRN: [id number] DOB: 1980-09-03   Cancelled Treatment:    Reason Eval/Treat Not Completed: Fatigue/lethargy limiting ability to participate, per RN, patient has been sleeping most of day, On PCA. Should progress well once pain controlled and alert. [place] Office [phone] Weekend pager-[phone]   Claretha Cooper 08/28/2022, 4:27 PM

## 2022-08-29 ENCOUNTER — Other Ambulatory Visit (HOSPITAL_COMMUNITY): Payer: Self-pay

## 2022-08-29 DIAGNOSIS — D5701 Hb-SS disease with acute chest syndrome: Secondary | ICD-10-CM | POA: Diagnosis not present

## 2022-08-29 LAB — COMPREHENSIVE METABOLIC PANEL
ALT: 24 U/L (ref 0–44)
AST: 60 U/L — ABNORMAL HIGH (ref 15–41)
Albumin: 4 g/dL (ref 3.5–5.0)
Alkaline Phosphatase: 55 U/L (ref 38–126)
Anion gap: 8 (ref 5–15)
BUN: 7 mg/dL (ref 6–20)
CO2: 21 mmol/L — ABNORMAL LOW (ref 22–32)
Calcium: 8.3 mg/dL — ABNORMAL LOW (ref 8.9–10.3)
Chloride: 110 mmol/L (ref 98–111)
Creatinine, Ser: 0.61 mg/dL (ref 0.61–1.24)
GFR, Estimated: >60 mL/min (ref >60)
Glucose, Bld: 100 mg/dL — ABNORMAL HIGH (ref 70–99)
Potassium: 4.1 mmol/L (ref 3.5–5.1)
Sodium: 139 mmol/L (ref 135–145)
Total Bilirubin: 4.4 mg/dL — ABNORMAL HIGH (ref 0.3–1.2)
Total Protein: 7.2 g/dL (ref 6.5–8.1)

## 2022-08-29 LAB — CBC WITH DIFFERENTIAL/PLATELET
Abs Immature Granulocytes: 0.03 10*3/uL (ref 0.00–0.07)
Basophils Absolute: 0 10*3/uL (ref 0.0–0.1)
Basophils Relative: 0 %
Eosinophils Absolute: 0.2 10*3/uL (ref 0.0–0.5)
Eosinophils Relative: 2 %
HCT: 26.6 % — ABNORMAL LOW (ref 39.0–52.0)
Hemoglobin: 8.9 g/dL — ABNORMAL LOW (ref 13.0–17.0)
Immature Granulocytes: 0 %
Lymphocytes Relative: 34 %
Lymphs Abs: 3.5 10*3/uL (ref 0.7–4.0)
MCH: 33.2 pg (ref 26.0–34.0)
MCHC: 33.5 g/dL (ref 30.0–36.0)
MCV: 99.3 fL (ref 80.0–100.0)
Monocytes Absolute: 2.1 10*3/uL — ABNORMAL HIGH (ref 0.1–1.0)
Monocytes Relative: 20 %
Neutro Abs: 4.5 10*3/uL (ref 1.7–7.7)
Neutrophils Relative %: 44 %
Platelets: 222 10*3/uL (ref 150–400)
RBC: 2.68 MIL/uL — ABNORMAL LOW (ref 4.22–5.81)
RDW: 23.7 % — ABNORMAL HIGH (ref 11.5–15.5)
WBC: 10.5 10*3/uL (ref 4.0–10.5)
nRBC: 4.5 % — ABNORMAL HIGH (ref 0.0–0.2)

## 2022-08-29 LAB — GLUCOSE, CAPILLARY
Glucose-Capillary: 100 mg/dL — ABNORMAL HIGH (ref 70–99)
Glucose-Capillary: 127 mg/dL — ABNORMAL HIGH (ref 70–99)
Glucose-Capillary: 161 mg/dL — ABNORMAL HIGH (ref 70–99)

## 2022-08-29 MED ORDER — FOLIC ACID 1 MG PO TABS
1.0000 mg | ORAL_TABLET | Freq: Every day | ORAL | 0 refills | Status: AC
  Filled 2022-08-29: qty 300, 300d supply, fill #0

## 2022-08-29 MED ORDER — OXYCODONE HCL 5 MG PO TABS: 15.0000 mg | ORAL_TABLET | ORAL | Status: DC | PRN

## 2022-08-29 NOTE — Evaluation (Signed)
Physical Therapy Evaluation Patient Details Name: Michael Mullins MRN: [id number] DOB: May 09, 1981 Today's Date: 08/29/2022  History of Present Illness  42 YO male admitted with sickle cell crisis on 08/26/22.  Clinical Impression   Patient is independnetly ambulating with no device.  SPO2 96-100% on RA.  Patient to DC home. No further PT needs.       Recommendations for follow up therapy are one component of a multi-disciplinary discharge planning process, led by the attending physician.  Recommendations may be updated based on patient status, additional functional criteria and insurance authorization.  Follow Up Recommendations No PT follow up      Assistance Recommended at Discharge None  Patient can return home with the following  Assist for transportation    Equipment Recommendations None recommended by PT  Recommendations for Other Services       Functional Status Assessment Patient has not had a recent decline in their functional status     Precautions / Restrictions Precautions Precautions: None Restrictions Weight Bearing Restrictions: No      Mobility  Bed Mobility Overal bed mobility: Independent                  Transfers Overall transfer level: Independent                      Ambulation/Gait Ambulation/Gait assistance: Independent Gait Distance (Feet): 400 Feet Assistive device: IV Pole   Gait velocity: wfl     General Gait Details: independnet in room without IV pole  Stairs            Wheelchair Mobility    Modified Rankin (Stroke Patients Only)       Balance Overall balance assessment: Independent                                           Pertinent Vitals/Pain Pain Assessment Pain Assessment: No/denies pain    Home Living Family/patient expects to be discharged to:: Private residence Living Arrangements: Spouse/significant other Available Help at Discharge: Family;Available  PRN/intermittently Type of Home: House Home Access: Level entry       Home Layout: One level Home Equipment: None      Prior Function Prior Level of Function : Independent/Modified Independent;Working/employed             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[employer]   Dominant Hand: Right    Extremity/Trunk Assessment   Upper Extremity Assessment Upper Extremity Assessment: Overall WFL for tasks assessed    Lower Extremity Assessment Lower Extremity Assessment: Overall WFL for tasks assessed    Cervical / Trunk Assessment Cervical / Trunk Assessment: Normal  Communication   Communication: No difficulties  Cognition Arousal/Alertness: Awake/alert Behavior During Therapy: WFL for tasks assessed/performed Overall Cognitive Status: Within Functional Limits for tasks assessed                                          General Comments      Exercises     Assessment/Plan    PT Assessment Patient does not need any further PT services  PT Problem List         PT Treatment Interventions      PT Goals (Current  goals can be found in the Care Plan section)  Acute Rehab PT Goals Patient Stated Goal: home PT Goal Formulation: All assessment and education complete, DC therapy    Frequency       Co-evaluation               AM-PAC PT "6 Clicks" Mobility  Outcome Measure Help needed turning from your back to your side while in a flat bed without using bedrails?: None Help needed moving from lying on your back to sitting on the side of a flat bed without using bedrails?: None Help needed moving to and from a bed to a chair (including a wheelchair)?: None Help needed standing up from a chair using your arms (e.g., wheelchair or bedside chair)?: None Help needed to walk in hospital room?: None Help needed climbing 3-5 steps with a railing? : None 6 Click Score: 24    End of Session   Activity Tolerance: Patient  tolerated treatment well Patient left: in bed;with nursing/sitter in room Nurse Communication: Mobility status PT Visit Diagnosis: Difficulty in walking, not elsewhere classified (R26.2)    Time: [id number] PT Time Calculation (min) (ACUTE ONLY): 14 min   Charges:   PT Evaluation $PT Eval Low Complexity: 1 Low          [place] Office [phone] Weekend pager-[phone]   Claretha Cooper 08/29/2022, 11:50 AM

## 2022-08-31 LAB — CULTURE, BLOOD (ROUTINE X 2)
Culture: NO GROWTH
Culture: NO GROWTH
Special Requests: ADEQUATE

## 2022-09-02 NOTE — Discharge Summary (Addendum)
Physician Discharge Summary  Michael Mullins [id number] DOB: 16-Jun-1980 DOA: 08/26/2022  PCP: Michael Pummel, MD  Admit date: 08/26/2022  Discharge date: 08/29/2022  Discharge Diagnoses:  Principal Problem:   Acute chest syndrome [facility])   Discharge Condition: Stable  Disposition:   Follow-up Information     Michael Pummel, MD Follow up.   Specialty: Internal Medicine Contact information: [place] [address] [phone]         Dorena Dew, FNP Follow up.   Specialty: Family Medicine Why: Sickle cell clinic: [phone] Contact information: [place]. [place] [address] [phone]                Pt is discharged home in good condition and is to follow up with Coy Saunas, Myrna Blazer, MD this week to have labs evaluated. Michael Mullins is instructed to increase activity slowly and balance with rest for the next few days, and use prescribed medication to complete treatment of pain  Diet: Regular Wt Readings from Last 3 Encounters:  08/27/22 109.2 kg  11/07/21 98.9 kg  10/01/21 95 kg    History of present illness:  Michael Mullins is a 42 year old male with a medical history significant for sickle cell disease, opiate tolerance, hypertension, type 2 diabetes mellitus, and obesity who presented to [facility] emergency department from home due to concerns for sickle cell pain crisis.  The patient reports last night he started having pain in his left lower extremity, which migrated to his chest and upper arms bilaterally.  Symptoms are similar to prior sickle cell pain crisis.  He presented to the ED today for further evaluation.  In the ED, febrile with Tmax of 100.4, hypoxic with oxygen saturation of 88%.  Chest x-ray notable for right lower lobe infiltrates.  Due to concern for acute chest syndrome, EDP started IV antibiotics and IV opiate-based analgesics.  TRH, hospitalist service was asked to  admit. The patient was admitted to [facility] stepdown unit for further management of his sickle cell crisis and acute chest syndrome.  ED course: Tmax 100.4.  BP 114/68, pulse 68, respirations 13, oxygen saturation 93% on 2 L.  Lab studies notable for serum bicarb 21, glucose 155, AST 43, total bilirubin 6.8.  WBCs 15.8, hemoglobin 9, and platelet count 274.  Hospital Course:  Sickle cell disease with pain crisis: Patient was admitted for sickle cell pain crisis and managed appropriately with IVF, IV Dilaudid via PCA and IV Toradol, as well as other adjunct therapies per sickle cell pain management protocols.  IV Dilaudid PCA weaned appropriately.  Patient states that he is not having very much pain today and has been transition to his home medication of oxycodone 15 mg every 6 hours.  Patient's pain medications are managed by his hematology team at [facility].  Patient will resume all home medications.  He has a scheduled appointment for medication management with his hematology team. Probable acute chest syndrome: Patient did not maintain oxygen requirement throughout admission.  Patient ambulating with oxygen saturation remaining above 90%.  IV antibiotics were de-escalated and patient has remained afebrile.  Type 2 diabetes mellitus: Patient will resume Jardiance.  Follow-up with PCP for management. Hypertension stable.  Home medications continued.  Patient was therefore discharged home today in a hemodynamically stable condition.   [name] will follow-up with PCP within 1 week of this discharge. [name] was counseled extensively about nonpharmacologic means of pain management, patient verbalized  understanding and was appreciative of  the care received during this admission.   We discussed the need for good hydration, monitoring of hydration status, avoidance of heat, cold, stress, and infection triggers. We discussed the need to be adherent with taking  other home  medications. Patient was reminded of the need to seek medical attention immediately if any symptom of bleeding, anemia, or infection occurs.  Discharge Exam: Vitals:   08/29/22 1135 08/29/22 1321  BP:  (!) 154/71  Pulse:  81  Resp:  18  Temp:    SpO2: 96% 99%   Vitals:   08/29/22 0738 08/29/22 0948 08/29/22 1135 08/29/22 1321  BP:  121/78  (!) 154/71  Pulse:  77  81  Resp: 14 18  18   Temp:  98.4 F (36.9 C)    TempSrc:  Oral    SpO2: 96% 97% 96% 99%  Weight:      Height:        General appearance : Awake, alert, not in any distress. Speech Clear. Not toxic looking HEENT: Atraumatic and Normocephalic, pupils equally reactive to light and accomodation Neck: Supple, no JVD. No cervical lymphadenopathy.  Chest: Good air entry bilaterally, no added sounds  CVS: S1 S2 regular, no murmurs.  Abdomen: Bowel sounds present, Non tender and not distended with no gaurding, rigidity or rebound. Extremities: B/L Lower Ext shows no edema, both legs are warm to touch Neurology: Awake alert, and oriented X 3, CN II-XII intact, Non focal Skin: No Rash  Discharge Instructions  Discharge Instructions     Discharge patient   Complete by: As directed    Discharge disposition: 01-Home or Self Care   Discharge patient date: 08/29/2022      Allergies as of 08/29/2022       Reactions   Morphine Hives   Tolerates hydromorphone    Vancomycin Itching   Tolerates with longer infusion times and premedcation with IV bendryl        Medication List     TAKE these medications    clobetasol 0.05 % external solution Commonly known as: TEMOVATE Apply 1 Application topically daily to area on scalp. What changed: additional instructions   Easy Touch Alcohol Prep Medium 70 % Pads Use 1 swab as needed to clean site for blood sugar check.   ferrous sulfate 325 (65 FE) MG tablet Commonly known as: FeroSul Take by mouth.   folic acid 1 MG tablet Commonly known as: FOLVITE Take 1 tablet (1  mg total) by mouth daily.   folic acid 1 MG tablet Commonly known as: FOLVITE Take 1 tablet (1 mg total) by mouth daily.   folic acid 1 MG tablet Commonly known as: FOLVITE Take 1 tablet (1 mg total) by mouth daily.   freestyle lancets Use 1 lancet to lance skin to check blood sugar once daily.   FREESTYLE LITE test strip Generic drug: glucose blood Use 1 strip to check blood sugar daily.   Humira (2 Pen) 40 MG/0.8ML Pnkt Generic drug: Adalimumab Inject 0.8 mL (40 mg total) under the skin every 14 (fourteen) days.   ibuprofen 800 MG tablet Commonly known as: ADVIL Take 1 tablet (800 mg total) by mouth every 8 (eight) hours as needed for Pain or Fever. What changed:  when to take this reasons to take this   Jardiance 10 MG Tabs tablet Generic drug: empagliflozin Take 1 tablet (10 mg total) by mouth daily.   lisinopril 2.5 MG tablet Commonly known as: ZESTRIL Take 1  tablet (2.5 mg total) by mouth daily.   meclizine 25 MG tablet Commonly known as: ANTIVERT Take 1 tablet (25 mg total) by mouth 3 (three)  times daily as needed. What changed:  when to take this reasons to take this   methadone 10 MG tablet Commonly known as: DOLOPHINE Take 1 tablet (10 mg total) by mouth 3 (three) times daily. What changed: when to take this   naloxone 4 MG/0.1ML Liqd nasal spray kit Commonly known as: NARCAN Use as directed every 2-3 minutes until emergency arrives, call 911   Oxbryta 500 MG Tabs tablet Generic drug: voxelotor Take 1,500 mg by mouth daily.   oxycodone 30 MG immediate release tablet Commonly known as: ROXICODONE Take 1 tablet by mouth every 6 hours as needed for Pain. What changed:  when to take this reasons to take this   triamcinolone cream 0.1 % Commonly known as: KENALOG Apply to affected skin of arms twice daily as needed. NEVER to face/neck/groin.   Vitamin D (Ergocalciferol) 1.25 MG (50000 UNIT) Caps capsule Commonly known as: DRISDOL Take 1  capsule (50,000 Units total) by mouth every 30 (thirty) days.   Vitamin D (Ergocalciferol) 1.25 MG (50000 UNIT) Caps capsule Commonly known as: DRISDOL Take 1 capsule (50,000 Units total) by mouth every 30 (thirty) days.        The results of significant diagnostics from this hospitalization (including imaging, microbiology, ancillary and laboratory) are listed below for reference.    Significant Diagnostic Studies: CT HEAD WO CONTRAST (5MM)  Result Date: 08/27/2022 CLINICAL DATA:  Headache with increased frequency or severity EXAM: CT HEAD WITHOUT CONTRAST TECHNIQUE: Contiguous axial images were obtained from the base of the skull through the vertex without intravenous contrast. RADIATION DOSE REDUCTION: This exam was performed according to the departmental dose-optimization program which includes automated exposure control, adjustment of the mA and/or kV according to patient size and/or use of iterative reconstruction technique. COMPARISON:  09/26/2021 FINDINGS: Brain: No evidence of acute infarction, hemorrhage, hydrocephalus, extra-axial collection or mass lesion/mass effect. Vascular: No hyperdense vessel or unexpected calcification. Skull: Patchy sclerosis of the calvarium, likely related to patient's sickle cell disease. Scalp scarring towards the vertex. Sinuses/Orbits: No acute finding. IMPRESSION: No acute or interval finding. Electronically Signed   By: Jorje Guild M.D.   On: 08/27/2022 21:28   DG Chest 2 View  Result Date: 08/26/2022 CLINICAL DATA:  Cough, shortness of breath and sickle cell pain crisis. EXAM: CHEST - 2 VIEW COMPARISON:  September 26, 2021 FINDINGS: The heart size and mediastinal contours are within normal limits. Mild right basilar atelectasis and/or early infiltrate is seen. There is no evidence of a pleural effusion or pneumothorax. No acute osseous abnormalities are identified. IMPRESSION: Mild right basilar atelectasis and/or early infiltrate. Electronically  Signed   By: Virgina Norfolk M.D.   On: 08/26/2022 22:55    Microbiology: Recent Results (from the past 240 hour(s))  Blood culture (routine x 2)     Status: None   Collection Time: 08/26/22  9:48 PM   Specimen: BLOOD LEFT HAND  Result Value Ref Range Status   Specimen Description BLOOD LEFT HAND  Final   Special Requests   Final    BOTTLES DRAWN AEROBIC AND ANAEROBIC Blood Culture results may not be optimal due to an inadequate volume of blood received in culture bottles   Culture   Final    NO GROWTH 5 DAYS Performed at [facility], [address] [place], [place] [address]  Report Status 08/31/2022 FINAL  Final  Blood culture (routine x 2)     Status: None   Collection Time: 08/26/22 10:13 PM   Specimen: BLOOD  Result Value Ref Range Status   Specimen Description BLOOD BLOOD RIGHT HAND  Final   Special Requests   Final    BOTTLES DRAWN AEROBIC AND ANAEROBIC Blood Culture adequate volume   Culture   Final    NO GROWTH 5 DAYS Performed at [facility], [address], [place], [place] [address]    Report Status 08/31/2022 FINAL  Final  MRSA Next Gen by PCR, Nasal     Status: None   Collection Time: 08/27/22  7:02 AM   Specimen: Nasal Mucosa; Nasal Swab  Result Value Ref Range Status   MRSA by PCR Next Gen NOT DETECTED NOT DETECTED Final    Comment: (NOTE) The GeneXpert MRSA Assay (FDA approved for NASAL specimens only), is one component of a comprehensive MRSA colonization surveillance program. It is not intended to diagnose MRSA infection nor to guide or monitor treatment for MRSA infections. Test performance is not FDA approved in patients less than 88 years old. Performed at [facility], [place] [address] [place], [place] [address]      Labs: Basic Metabolic Panel: Recent Labs  Lab 08/26/22 2148 08/27/22 0552 08/28/22 0631 08/29/22 0941  NA 135 134* 137 139  K 3.6 3.9 3.8 4.1  CL 105 105 107 110  CO2 21* 22 22 21*  GLUCOSE 155* 132*  109* 100*  BUN 8 8 12 7   CREATININE 0.61 0.66 0.71 0.61  CALCIUM 8.5* 8.1* 8.1* 8.3*  MG  --  1.9  --   --   PHOS  --  3.7  --   --    Liver Function Tests: Recent Labs  Lab 08/26/22 2148 08/27/22 0552 08/29/22 0941  AST 43* 40 60*  ALT 22 21 24   ALKPHOS 57 52 55  BILITOT 6.8* 6.2* 4.4*  PROT 7.9 7.1 7.2  ALBUMIN 4.1 3.8 4.0   No results for input(s): "LIPASE", "AMYLASE" in the last 168 hours. No results for input(s): "AMMONIA" in the last 168 hours. CBC: Recent Labs  Lab 08/26/22 2148 08/27/22 0552 08/28/22 0631 08/29/22 0941  WBC 15.8* 12.9* 9.0 10.5  NEUTROABS 11.0*  --   --  4.5  HGB 9.0* 8.4* 8.1* 8.9*  HCT 25.0* 22.9* 22.9* 26.6*  MCV 95.1 93.9 96.6 99.3  PLT 274 219 183 222   Cardiac Enzymes: No results for input(s): "CKTOTAL", "CKMB", "CKMBINDEX", "TROPONINI" in the last 168 hours. BNP: Invalid input(s): "POCBNP" CBG: Recent Labs  Lab 08/28/22 1111 08/28/22 1627 08/29/22 0016 08/29/22 0730 08/29/22 1125  GLUCAP 122* 100* 127* 100* 161*    Time coordinating discharge: 30 minutes  Signed:  Donia Pounds  APRN, MSN, FNP-C [facility] [place], [place] [address] [phone]  [facility] 09/02/2022, 6:54 PM

## 2022-09-09 ENCOUNTER — Other Ambulatory Visit (HOSPITAL_COMMUNITY): Payer: Self-pay

## 2022-09-09 DIAGNOSIS — E559 Vitamin D deficiency, unspecified: Secondary | ICD-10-CM | POA: Diagnosis not present

## 2022-09-09 DIAGNOSIS — D571 Sickle-cell disease without crisis: Secondary | ICD-10-CM | POA: Diagnosis not present

## 2022-09-09 DIAGNOSIS — G8929 Other chronic pain: Secondary | ICD-10-CM | POA: Diagnosis not present

## 2022-09-09 MED ORDER — VITAMIN D (ERGOCALCIFEROL) 1.25 MG (50000 UNIT) PO CAPS
50000.0000 [IU] | ORAL_CAPSULE | ORAL | 0 refills | Status: DC
  Filled 2022-09-09 – 2023-06-02 (×3): qty 3, 90d supply, fill #0

## 2022-09-12 ENCOUNTER — Other Ambulatory Visit (HOSPITAL_COMMUNITY): Payer: Self-pay

## 2022-09-13 ENCOUNTER — Other Ambulatory Visit (HOSPITAL_COMMUNITY): Payer: Self-pay

## 2022-09-13 MED ORDER — OXYCODONE HCL 30 MG PO TABS
30.0000 mg | ORAL_TABLET | Freq: Four times a day (QID) | ORAL | 0 refills | Status: DC | PRN
  Filled 2022-09-13: qty 120, 30d supply, fill #0

## 2022-09-13 MED ORDER — METHADONE HCL 10 MG PO TABS
10.0000 mg | ORAL_TABLET | Freq: Three times a day (TID) | ORAL | 0 refills | Status: DC
  Filled 2022-09-13: qty 90, 30d supply, fill #0

## 2022-09-16 ENCOUNTER — Other Ambulatory Visit: Payer: Self-pay

## 2022-09-16 ENCOUNTER — Other Ambulatory Visit (HOSPITAL_COMMUNITY): Payer: Self-pay

## 2022-09-18 ENCOUNTER — Other Ambulatory Visit (HOSPITAL_COMMUNITY): Payer: Self-pay

## 2022-10-01 ENCOUNTER — Other Ambulatory Visit (HOSPITAL_COMMUNITY): Payer: Self-pay

## 2022-10-01 DIAGNOSIS — Z0000 Encounter for general adult medical examination without abnormal findings: Secondary | ICD-10-CM | POA: Diagnosis not present

## 2022-10-01 DIAGNOSIS — G8929 Other chronic pain: Secondary | ICD-10-CM | POA: Diagnosis not present

## 2022-10-01 DIAGNOSIS — E6609 Other obesity due to excess calories: Secondary | ICD-10-CM | POA: Diagnosis not present

## 2022-10-01 DIAGNOSIS — D571 Sickle-cell disease without crisis: Secondary | ICD-10-CM | POA: Diagnosis not present

## 2022-10-01 DIAGNOSIS — E11649 Type 2 diabetes mellitus with hypoglycemia without coma: Secondary | ICD-10-CM | POA: Diagnosis not present

## 2022-10-01 DIAGNOSIS — Q8901 Asplenia (congenital): Secondary | ICD-10-CM | POA: Diagnosis not present

## 2022-10-01 DIAGNOSIS — Z6833 Body mass index (BMI) 33.0-33.9, adult: Secondary | ICD-10-CM | POA: Diagnosis not present

## 2022-10-01 MED ORDER — ROSUVASTATIN CALCIUM 5 MG PO TABS
5.0000 mg | ORAL_TABLET | Freq: Every day | ORAL | 3 refills | Status: AC
  Filled 2022-10-01: qty 90, 90d supply, fill #0
  Filled 2022-12-27: qty 90, 90d supply, fill #1

## 2022-10-09 ENCOUNTER — Other Ambulatory Visit (HOSPITAL_COMMUNITY): Payer: Self-pay

## 2022-10-14 ENCOUNTER — Other Ambulatory Visit (HOSPITAL_COMMUNITY): Payer: Self-pay

## 2022-10-14 ENCOUNTER — Other Ambulatory Visit: Payer: Self-pay

## 2022-10-14 MED ORDER — OXYCODONE HCL 30 MG PO TABS
30.0000 mg | ORAL_TABLET | Freq: Four times a day (QID) | ORAL | 0 refills | Status: DC | PRN
  Filled 2022-10-14: qty 120, 30d supply, fill #0

## 2022-10-14 MED ORDER — METHADONE HCL 10 MG PO TABS
10.0000 mg | ORAL_TABLET | Freq: Three times a day (TID) | ORAL | 0 refills | Status: DC
  Filled 2022-10-14: qty 90, 30d supply, fill #0

## 2022-11-05 ENCOUNTER — Other Ambulatory Visit: Payer: Self-pay

## 2022-11-11 ENCOUNTER — Other Ambulatory Visit (HOSPITAL_COMMUNITY): Payer: Self-pay

## 2022-11-14 ENCOUNTER — Other Ambulatory Visit (HOSPITAL_COMMUNITY): Payer: Self-pay

## 2022-11-14 MED ORDER — METHADONE HCL 10 MG PO TABS
10.0000 mg | ORAL_TABLET | Freq: Three times a day (TID) | ORAL | 0 refills | Status: DC
  Filled 2022-11-14: qty 90, 30d supply, fill #0

## 2022-11-14 MED ORDER — OXYCODONE HCL 30 MG PO TABS
30.0000 mg | ORAL_TABLET | Freq: Four times a day (QID) | ORAL | 0 refills | Status: DC | PRN
  Filled 2022-11-14: qty 120, 30d supply, fill #0

## 2022-11-15 ENCOUNTER — Other Ambulatory Visit (HOSPITAL_COMMUNITY): Payer: Self-pay

## 2022-11-20 ENCOUNTER — Other Ambulatory Visit (HOSPITAL_COMMUNITY): Payer: Self-pay

## 2022-11-20 DIAGNOSIS — L663 Perifolliculitis capitis abscedens: Secondary | ICD-10-CM | POA: Diagnosis not present

## 2022-11-20 DIAGNOSIS — L700 Acne vulgaris: Secondary | ICD-10-CM | POA: Diagnosis not present

## 2022-11-20 DIAGNOSIS — L729 Follicular cyst of the skin and subcutaneous tissue, unspecified: Secondary | ICD-10-CM | POA: Diagnosis not present

## 2022-11-20 DIAGNOSIS — Z5181 Encounter for therapeutic drug level monitoring: Secondary | ICD-10-CM | POA: Diagnosis not present

## 2022-11-20 MED ORDER — CLINDAMYCIN PHOS-BENZOYL PEROX 1.2-5 % EX GEL
CUTANEOUS | 5 refills | Status: DC
  Filled 2022-11-20: qty 45, 30d supply, fill #0
  Filled 2022-12-27: qty 45, 30d supply, fill #1

## 2022-11-20 MED ORDER — CLOBETASOL PROPIONATE 0.05 % EX SOLN
1.0000 | Freq: Every day | CUTANEOUS | 11 refills | Status: DC
  Filled 2022-11-20: qty 50, 30d supply, fill #0

## 2022-11-21 ENCOUNTER — Other Ambulatory Visit (HOSPITAL_COMMUNITY): Payer: Self-pay

## 2022-12-02 ENCOUNTER — Other Ambulatory Visit (HOSPITAL_COMMUNITY): Payer: Self-pay

## 2022-12-05 ENCOUNTER — Other Ambulatory Visit (HOSPITAL_COMMUNITY): Payer: Self-pay

## 2022-12-10 ENCOUNTER — Other Ambulatory Visit (HOSPITAL_COMMUNITY): Payer: Self-pay

## 2022-12-11 ENCOUNTER — Other Ambulatory Visit: Payer: Self-pay

## 2022-12-11 ENCOUNTER — Other Ambulatory Visit (HOSPITAL_COMMUNITY): Payer: Self-pay

## 2022-12-11 ENCOUNTER — Ambulatory Visit: Payer: Commercial Managed Care - PPO | Attending: Internal Medicine | Admitting: Pharmacist

## 2022-12-11 ENCOUNTER — Telehealth: Payer: Self-pay | Admitting: Pharmacist

## 2022-12-11 DIAGNOSIS — Z79899 Other long term (current) drug therapy: Secondary | ICD-10-CM

## 2022-12-11 MED ORDER — HUMIRA (2 PEN) 40 MG/0.8ML ~~LOC~~ AJKT: AUTO-INJECTOR | SUBCUTANEOUS | 2 refills | Status: DC

## 2022-12-11 MED ORDER — HUMIRA (2 PEN) 40 MG/0.8ML ~~LOC~~ AJKT
AUTO-INJECTOR | SUBCUTANEOUS | 2 refills | Status: DC
  Filled 2022-12-11: qty 2, 28d supply, fill #0
  Filled 2023-01-01: qty 2, 28d supply, fill #1
  Filled 2023-02-04: qty 2, 28d supply, fill #2

## 2022-12-11 NOTE — Telephone Encounter (Signed)
Called patient to schedule an appointment for the [employer] [facility]. I was unable to reach the patient so I left a HIPAA-compliant message requesting that the patient return my call.   Luke Van Ausdall, PharmD, BCACP, CPP Clinical Pharmacist [facility] [phone]  

## 2022-12-11 NOTE — Telephone Encounter (Signed)
Called patient to schedule an appointment for the [employer] [facility] [employer] [facility]. I was unable to reach the patient so I left a HIPAA-compliant message requesting that the patient return my call.   Luke Van Ausdall, PharmD, BCACP, CPP Clinical Pharmacist [facility] [phone]  

## 2022-12-11 NOTE — Progress Notes (Signed)
  S: Patient presents for review of their specialty medication therapy.  Patient is taking Humira for HS. Patient is managed by Dr. Coralie Carpen for this.   Adherence: confirms  Efficacy: confirms good results with the medication  Dosing: Hidradenitis suppurativa (Humira only): SubQ: Initial: 160 mg (given as four 40 mg injections on day 1 or given as two 40 mg injections per day over 2 consecutive days), then 80 mg 2 weeks later (day 15). Maintenance: 40 mg every week beginning day 29.  Dose adjustments: Renal: no dose adjustments (has not been studied) Hepatic: no dose adjustments (has not been studied)  Drug-drug interactions:none identified   Screening: TB test: completed per pt, negative  Hepatitis: completed per pt, negative   Monitoring:  S/sx of infection: none  CBC: monitored by his specialist. See CareEverywhere  S/sx of hypersensitivity: none  S/sx of malignancy: none S/sx of heart failure: none   Other side effects: none   O:     Lab Results  Component Value Date   WBC 10.5 08/29/2022   HGB 8.9 (L) 08/29/2022   HCT 26.6 (L) 08/29/2022   MCV 99.3 08/29/2022   PLT 222 08/29/2022      Chemistry      Component Value Date/Time   NA 139 08/29/2022 0941   K 4.1 08/29/2022 0941   CL 110 08/29/2022 0941   CO2 21 (L) 08/29/2022 0941   BUN 7 08/29/2022 0941   CREATININE 0.61 08/29/2022 0941      Component Value Date/Time   CALCIUM 8.3 (L) 08/29/2022 0941   ALKPHOS 55 08/29/2022 0941   AST 60 (H) 08/29/2022 0941   ALT 24 08/29/2022 0941   BILITOT 4.4 (H) 08/29/2022 0941       A/P: 1. Medication review: Patient is taking Humira for HS. Reviewed the medication with the patient, including the following: Humira is a TNF blocking agent indicated for ankylosing spondylitis, Crohn's disease, Hidradenitis suppurativa, psoriatic arthritis, plaque psoriasis, ulcerative colitis, and uveitis. Patient educated on purpose, proper use and potential adverse effects of  Humira. Possible adverse effects are increased risk of infections, headache, and injection site reactions. There is the possibility of an increased risk of malignancy but it is not well understood if this increased risk is due to there medication or the disease state. There are rare cases of pancytopenia and aplastic anemia. For SubQ injection at separate sites in the thigh or lower abdomen (avoiding areas within 2 inches of navel); rotate injection sites. May leave at room temperature for ~15 to 30 minutes prior to use; do not remove cap or cover while allowing product to reach room temperature. Do not use if solution is discolored or contains particulate matter. Do not administer to skin which is red, tender, bruised, hard, or that has scars, stretch marks, or psoriasis plaques. Needle cap of the prefilled syringe or needle cover for the adalimumab pen may contain latex. Prefilled pens and syringes are available for use by patients and the full amount of the syringe should be injected (self-administration); the vial is intended for institutional use only. Vials do not contain a preservative; discard unused portion. No recommendations for any changes at this time.   Butch Penny, PharmD, [place]y [place], CPP Clinical Pharmacist [facility] [phone]

## 2022-12-13 ENCOUNTER — Other Ambulatory Visit: Payer: Self-pay

## 2022-12-16 ENCOUNTER — Other Ambulatory Visit (HOSPITAL_COMMUNITY): Payer: Self-pay

## 2022-12-16 DIAGNOSIS — G8929 Other chronic pain: Secondary | ICD-10-CM | POA: Diagnosis not present

## 2022-12-16 DIAGNOSIS — E559 Vitamin D deficiency, unspecified: Secondary | ICD-10-CM | POA: Diagnosis not present

## 2022-12-16 DIAGNOSIS — D571 Sickle-cell disease without crisis: Secondary | ICD-10-CM | POA: Diagnosis not present

## 2022-12-16 MED ORDER — OXYCODONE HCL 30 MG PO TABS
30.0000 mg | ORAL_TABLET | Freq: Four times a day (QID) | ORAL | 0 refills | Status: DC | PRN
  Filled 2022-12-16: qty 120, 30d supply, fill #0

## 2022-12-16 MED ORDER — METHADONE HCL 10 MG PO TABS
10.0000 mg | ORAL_TABLET | Freq: Three times a day (TID) | ORAL | 0 refills | Status: DC
  Filled 2022-12-16: qty 90, 30d supply, fill #0

## 2022-12-17 ENCOUNTER — Other Ambulatory Visit (HOSPITAL_COMMUNITY): Payer: Self-pay

## 2022-12-27 ENCOUNTER — Other Ambulatory Visit (HOSPITAL_COMMUNITY): Payer: Self-pay

## 2022-12-27 ENCOUNTER — Other Ambulatory Visit: Payer: Self-pay

## 2022-12-30 ENCOUNTER — Other Ambulatory Visit (HOSPITAL_COMMUNITY): Payer: Self-pay

## 2022-12-30 MED ORDER — JARDIANCE 10 MG PO TABS
10.0000 mg | ORAL_TABLET | Freq: Every day | ORAL | 3 refills | Status: DC
  Filled 2022-12-30: qty 80, 80d supply, fill #0
  Filled 2022-12-30: qty 10, 10d supply, fill #0
  Filled 2022-12-30 (×2): qty 90, 90d supply, fill #0
  Filled 2023-06-02: qty 90, 90d supply, fill #1

## 2022-12-30 MED ORDER — LISINOPRIL 2.5 MG PO TABS
2.5000 mg | ORAL_TABLET | Freq: Every day | ORAL | 3 refills | Status: AC
  Filled 2022-12-30: qty 90, 90d supply, fill #0
  Filled 2023-06-02: qty 90, 90d supply, fill #1

## 2023-01-01 ENCOUNTER — Other Ambulatory Visit (HOSPITAL_COMMUNITY): Payer: Self-pay

## 2023-01-07 ENCOUNTER — Other Ambulatory Visit: Payer: Self-pay

## 2023-01-13 ENCOUNTER — Other Ambulatory Visit: Payer: Self-pay

## 2023-01-13 ENCOUNTER — Emergency Department (HOSPITAL_COMMUNITY): Payer: Commercial Managed Care - PPO

## 2023-01-13 ENCOUNTER — Encounter (HOSPITAL_COMMUNITY): Payer: Self-pay

## 2023-01-13 ENCOUNTER — Emergency Department (HOSPITAL_COMMUNITY)
Admission: EM | Admit: 2023-01-13 | Discharge: 2023-01-13 | Disposition: A | Discharge status: Home / Self Care | Payer: Commercial Managed Care - PPO | Attending: Emergency Medicine | Admitting: Emergency Medicine

## 2023-01-13 DIAGNOSIS — Z7984 Long term (current) use of oral hypoglycemic drugs: Secondary | ICD-10-CM | POA: Diagnosis not present

## 2023-01-13 DIAGNOSIS — Z9049 Acquired absence of other specified parts of digestive tract: Secondary | ICD-10-CM | POA: Diagnosis not present

## 2023-01-13 DIAGNOSIS — E119 Type 2 diabetes mellitus without complications: Secondary | ICD-10-CM | POA: Diagnosis not present

## 2023-01-13 DIAGNOSIS — R109 Unspecified abdominal pain: Secondary | ICD-10-CM | POA: Diagnosis not present

## 2023-01-13 DIAGNOSIS — D5700 Hb-SS disease with crisis, unspecified: Secondary | ICD-10-CM | POA: Diagnosis not present

## 2023-01-13 DIAGNOSIS — D57219 Sickle-cell/Hb-C disease with crisis, unspecified: Secondary | ICD-10-CM | POA: Diagnosis not present

## 2023-01-13 DIAGNOSIS — M545 Low back pain, unspecified: Secondary | ICD-10-CM | POA: Diagnosis not present

## 2023-01-13 LAB — RETICULOCYTES
Immature Retic Fract: 34.9 % — ABNORMAL HIGH (ref 2.3–15.9)
RBC.: 4.2 MIL/uL — ABNORMAL LOW (ref 4.22–5.81)
Retic Count, Absolute: 398 10*3/uL — ABNORMAL HIGH (ref 19.0–186.0)
Retic Ct Pct: 9.5 % — ABNORMAL HIGH (ref 0.4–3.1)

## 2023-01-13 LAB — COMPREHENSIVE METABOLIC PANEL
ALT: 27 U/L (ref 0–44)
AST: 32 U/L (ref 15–41)
Albumin: 4.6 g/dL (ref 3.5–5.0)
Alkaline Phosphatase: 53 U/L (ref 38–126)
Anion gap: 9 (ref 5–15)
BUN: 8 mg/dL (ref 6–20)
CO2: 21 mmol/L — ABNORMAL LOW (ref 22–32)
Calcium: 9.1 mg/dL (ref 8.9–10.3)
Chloride: 108 mmol/L (ref 98–111)
Creatinine, Ser: 0.59 mg/dL — ABNORMAL LOW (ref 0.61–1.24)
GFR, Estimated: >60 mL/min (ref >60)
Glucose, Bld: 109 mg/dL — ABNORMAL HIGH (ref 70–99)
Potassium: 3.6 mmol/L (ref 3.5–5.1)
Sodium: 138 mmol/L (ref 135–145)
Total Bilirubin: 4.6 mg/dL — ABNORMAL HIGH (ref 0.3–1.2)
Total Protein: 8.4 g/dL — ABNORMAL HIGH (ref 6.5–8.1)

## 2023-01-13 LAB — CBC WITH DIFFERENTIAL/PLATELET
Abs Immature Granulocytes: 0.02 10*3/uL (ref 0.00–0.07)
Basophils Absolute: 0.1 10*3/uL (ref 0.0–0.1)
Basophils Relative: 1 %
Eosinophils Absolute: 0.2 10*3/uL (ref 0.0–0.5)
Eosinophils Relative: 2 %
HCT: 32.3 % — ABNORMAL LOW (ref 39.0–52.0)
Hemoglobin: 11.1 g/dL — ABNORMAL LOW (ref 13.0–17.0)
Immature Granulocytes: 0 %
Lymphocytes Relative: 41 %
Lymphs Abs: 4 10*3/uL (ref 0.7–4.0)
MCH: 26.6 pg (ref 26.0–34.0)
MCHC: 34.4 g/dL (ref 30.0–36.0)
MCV: 77.5 fL — ABNORMAL LOW (ref 80.0–100.0)
Monocytes Absolute: 1.7 10*3/uL — ABNORMAL HIGH (ref 0.1–1.0)
Monocytes Relative: 18 %
Neutro Abs: 3.6 10*3/uL (ref 1.7–7.7)
Neutrophils Relative %: 38 %
Platelets: 352 10*3/uL (ref 150–400)
RBC: 4.17 MIL/uL — ABNORMAL LOW (ref 4.22–5.81)
RDW: 25.3 % — ABNORMAL HIGH (ref 11.5–15.5)
WBC: 9.5 10*3/uL (ref 4.0–10.5)
nRBC: 0.2 % (ref 0.0–0.2)

## 2023-01-13 MED ORDER — SODIUM CHLORIDE 0.45 % IV SOLN: INTRAVENOUS | Status: DC

## 2023-01-13 MED ORDER — ONDANSETRON 4 MG PO TBDP
4.0000 mg | ORAL_TABLET | Freq: Three times a day (TID) | ORAL | 0 refills | Status: AC | PRN
  Filled 2023-01-13: qty 20, 7d supply, fill #0

## 2023-01-13 MED ORDER — HYDROMORPHONE HCL 1 MG/ML IJ SOLN
1.0000 mg | Freq: Once | INTRAMUSCULAR | Status: AC
  Administered 2023-01-13: 1 mg via INTRAVENOUS
  Filled 2023-01-13: qty 1

## 2023-01-13 MED ORDER — DIPHENHYDRAMINE HCL 25 MG PO CAPS
25.0000 mg | ORAL_CAPSULE | ORAL | Status: DC | PRN
  Administered 2023-01-13 (×2): 25 mg via ORAL
  Filled 2023-01-13 (×2): qty 1

## 2023-01-13 MED ORDER — ONDANSETRON HCL 4 MG/2ML IJ SOLN
4.0000 mg | INTRAMUSCULAR | Status: DC | PRN
  Administered 2023-01-13: 4 mg via INTRAVENOUS
  Filled 2023-01-13: qty 2

## 2023-01-13 MED ORDER — HYDROMORPHONE HCL 1 MG/ML IJ SOLN
1.0000 mg | INTRAMUSCULAR | Status: AC
  Administered 2023-01-13: 1 mg via INTRAVENOUS
  Filled 2023-01-13: qty 1

## 2023-01-13 NOTE — ED Provider Notes (Signed)
[place] EMERGENCY DEPARTMENT AT [facility] Provider Note   CSN: [id number] Arrival date & time: 01/13/23  4098     History  Chief Complaint  Patient presents with   Sickle Cell Pain Crisis    Michael Mullins is a 42 y.o. male.   Sickle Cell Pain Crisis    42 year old male with medical history significant for sickle cell anemia, DM 2 who presents to the emergency department with sickle cell pain.  The patient states that his right leg began hurting last night.  He has been taking his home oxycodone and has tried Tylenol and ibuprofen.  He workup in the middle of the night with worsening pain.  He also states that he is having sharp right-sided flank pain.  He denies any history of kidney stones.  No fevers or chills.  He denies any chest pain, shortness of breath, abdominal pain.  Home Medications Prior to Admission medications   Medication Sig Start Date End Date Taking? Authorizing Provider  adalimumab (HUMIRA, 2 PEN,) 40 MG/0.8ML PNKT pen Inject 0.8 mL (40 mg total) under the skin every 14 (fourteen) days. 12/11/22   Quentin Angst, MD  Clindamycin-Benzoyl Per, Refr, gel Apply topically daily to face 11/20/22     clobetasol (TEMOVATE) 0.05 % external solution Apply 1 Application topically daily to area on scalp. Patient taking differently: Apply 1 Application topically daily. When compliant 02/13/22     clobetasol (TEMOVATE) 0.05 % external solution Apply 1 Application topically daily to area on scalp 11/20/22     empagliflozin (JARDIANCE) 10 MG TABS tablet Take 1 tablet (10 mg total) by mouth daily. 12/30/22     ergocalciferol (VITAMIN D2) 1.25 MG (50000 UT) capsule Take 1 capsule (50,000 Units total) by mouth every 30 (thirty) days. 12/06/21     ergocalciferol (VITAMIN D2) 1.25 MG (50000 UT) capsule Take 1 capsule (50,000 Units total) by mouth every 30 (thirty) days. 03/25/22     ferrous sulfate (FEROSUL) 325 (65 FE) MG tablet Take by mouth. Patient not taking:  Reported on 08/27/2022 08/07/20     folic acid (FOLVITE) 1 MG tablet Take 1 tablet (1 mg total) by mouth daily. 11/28/21     folic acid (FOLVITE) 1 MG tablet Take 1 tablet (1 mg total) by mouth daily. 07/09/22     folic acid (FOLVITE) 1 MG tablet Take 1 tablet (1 mg total) by mouth daily. 08/29/22   Massie Maroon, FNP  glucose blood (PRECISION QID TEST) test strip Use 1 strip to check blood sugar daily. 03/22/22     GNP Alcohol Swabs 70 % PADS Use 1 swab as needed to clean site for blood sugar check. 03/22/22     ibuprofen (ADVIL) 800 MG tablet Take 1 tablet (800 mg total) by mouth every 8 (eight) hours as needed for Pain or Fever. Patient taking differently: Take 800 mg by mouth as needed for moderate pain. 10/08/21     Lancets (FREESTYLE) lancets Use 1 lancet to lance skin to check blood sugar once daily. 03/22/22     lisinopril (ZESTRIL) 2.5 MG tablet Take 1 tablet (2.5 mg total) by mouth daily. 12/30/22     meclizine (ANTIVERT) 25 MG tablet Take 1 tablet (25 mg total) by mouth 3 (three)  times daily as needed. Patient taking differently: Take 25 mg by mouth as needed for dizziness or nausea. 10/11/21     methadone (DOLOPHINE) 10 MG tablet Take 1 tablet (10 mg total) by mouth 3 (three)  times daily. 12/16/22     naloxone (NARCAN) nasal spray 4 mg/0.1 mL Use as directed every 2-3 minutes until emergency arrives, call 911 Patient not taking: Reported on 08/27/2022 08/07/20     OXBRYTA 500 MG TABS tablet Take 1,500 mg by mouth daily. 01/01/21   [provider]  oxycodone (ROXICODONE) 30 MG immediate release tablet Take 1 tablet (30 mg total) by mouth every 6 (six) hours as needed for pain. 12/16/22     rosuvastatin (CRESTOR) 5 MG tablet Take 1 tablet (5 mg total) by mouth daily for cholesterol 10/01/22     triamcinolone cream (KENALOG) 0.1 % Apply to affected skin of arms twice daily as needed. NEVER to face/neck/groin. Patient not taking: Reported on 08/27/2022 11/28/20     Vitamin D, Ergocalciferol,  (DRISDOL) 1.25 MG (50000 UNIT) CAPS capsule Take 1 capsule (50,000 Units total) by mouth every 30 (thirty) days. 09/09/22         Allergies    Morphine and Vancomycin    Review of Systems   Review of Systems  All other systems reviewed and are negative.   Physical Exam Updated Vital Signs BP 123/68   Pulse (!) 58   Temp 98.1 F (36.7 C)   Resp 17   Ht 5\' 10"  (1.778 m)   Wt 108.9 kg   SpO2 97%   BMI 34.44 kg/m  Physical Exam Vitals and nursing note reviewed.  Constitutional:      General: He is not in acute distress.    Appearance: He is well-developed.  HENT:     Head: Normocephalic and atraumatic.  Eyes:     Conjunctiva/sclera: Conjunctivae normal.  Cardiovascular:     Rate and Rhythm: Normal rate and regular rhythm.     Pulses: Normal pulses.  Pulmonary:     Effort: Pulmonary effort is normal. No respiratory distress.     Breath sounds: Normal breath sounds.  Abdominal:     Palpations: Abdomen is soft.     Tenderness: There is no abdominal tenderness. There is right CVA tenderness. There is no guarding.  Musculoskeletal:        General: No swelling.     Cervical back: Neck supple.  Skin:    General: Skin is warm and dry.     Capillary Refill: Capillary refill takes less than 2 seconds.  Neurological:     Mental Status: He is alert.  Psychiatric:        Mood and Affect: Mood normal.     ED Results / Procedures / Treatments   Labs (all labs ordered are listed, but only abnormal results are displayed) Labs Reviewed  COMPREHENSIVE METABOLIC PANEL - Abnormal; Notable for the following components:      Result Value   CO2 21 (*)    Glucose, Bld 109 (*)    Creatinine, Ser 0.59 (*)    Total Protein 8.4 (*)    Total Bilirubin 4.6 (*)    All other components within normal limits  CBC WITH DIFFERENTIAL/PLATELET - Abnormal; Notable for the following components:   RBC 4.17 (*)    Hemoglobin 11.1 (*)    HCT 32.3 (*)    MCV 77.5 (*)    RDW 25.3 (*)    Monocytes  Absolute 1.7 (*)    All other components within normal limits  RETICULOCYTES - Abnormal; Notable for the following components:   Retic Ct Pct 9.5 (*)    RBC. 4.20 (*)    Retic Count, Absolute 398.0 (*)  Immature Retic Fract 34.9 (*)    All other components within normal limits    EKG None  Radiology CT Renal Stone Study  Result Date: 01/13/2023 CLINICAL DATA:  Low back pain extending into the right leg. History of sickle-cell. EXAM: CT ABDOMEN AND PELVIS WITHOUT CONTRAST TECHNIQUE: Multidetector CT imaging of the abdomen and pelvis was performed following the standard protocol without IV contrast. RADIATION DOSE REDUCTION: This exam was performed according to the departmental dose-optimization program which includes automated exposure control, adjustment of the mA and/or kV according to patient size and/or use of iterative reconstruction technique. COMPARISON:  CT abdomen pelvis 2008. Report of the CT from June 2014 FINDINGS: Lower chest: There is some basilar atelectasis. There are some reticulonodular changes along the lung bases. No pleural effusion. Hepatobiliary: No focal liver abnormality is seen. Status post cholecystectomy. No biliary dilatation. Pancreas: Unremarkable. No pancreatic ductal dilatation or surrounding inflammatory changes. Spleen: Small and calcified spleen. Adrenals/Urinary Tract: Adrenal glands are preserved. No abnormal calcifications are seen within either kidney nor along the course of either ureter. There are some cystic areas identified in the right kidney towards the upper pole measuring up to 19 mm with Hounsfield units of 6. No specific imaging follow-up, Bosniak 1 or 2 lesion. No specific imaging follow-up. Preserved contours of the urinary bladder. Stomach/Bowel: No oral contrast. Large bowel is of normal course and caliber with scattered stool. Normal appendix. Stomach and small bowel is nondilated. Vascular/Lymphatic: No significant vascular findings are present.  No enlarged abdominal or pelvic lymph nodes. Reproductive: Penile prosthesis identified with a reservoir along the anterior left hemipelvis. Preserved prostate. Other: No free air or free fluid. Musculoskeletal: Diffuse bony sclerosis identified with Schmorl's node deformities. Trace retrolisthesis of L5 on S1. Multilevel disc height loss as well with some disc bulging and areas of possible stenosis. IMPRESSION: No bowel obstruction, free air or free fluid. Scattered stool. Normal appendix. No obstructing renal stone. Sequela of known sickle-cell disease with a small calcified spleen, bony sclerotic changes. There are however additional degenerative changes along the spine with areas of disc bulging and canal stenosis. Please correlate with particular symptoms and additional evaluation as clinically appropriate Electronically Signed   By: Karen Kays M.D.   On: 01/13/2023 11:20    Procedures Procedures    Medications Ordered in ED Medications  0.45 % sodium chloride infusion ( Intravenous New Bag/Given 01/13/23 1038)  diphenhydrAMINE (BENADRYL) capsule 25-50 mg (25 mg Oral Given 01/13/23 1038)  ondansetron (ZOFRAN) injection 4 mg (4 mg Intravenous Given 01/13/23 1038)  HYDROmorphone (DILAUDID) injection 1 mg (has no administration in time range)  HYDROmorphone (DILAUDID) injection 1 mg (1 mg Intravenous Given 01/13/23 1037)    ED Course/ Medical Decision Making/ A&P                                 Medical Decision Making Amount and/or Complexity of Data Reviewed Labs: ordered. Radiology: ordered.  Risk Prescription drug management.    42 year old male with medical history significant for sickle cell anemia, DM 2 who presents to the emergency department with sickle cell pain.  The patient states that his right leg began hurting last night.  He has been taking his home oxycodone and has tried Tylenol and ibuprofen.  He workup in the middle of the night with worsening pain.  He also states that he  is having sharp right-sided flank pain.  He denies any  history of kidney stones.  No fevers or chills.  He denies any chest pain, shortness of breath, abdominal pain.  On arrival, the patient is vitally stable, afebrile, not tachycardic or tachypneic, hemodynamically stable, saturating well on room air.  Physical exam unremarkable other than slight right-sided CVA tenderness.  Considered sickle cell pain crisis versus nephrolithiasis.  Labs: Reticulocyte percentage 9.5, reticulocyte count 398, significantly improved from previous measurements, CBC with a hemoglobin of 11.1, also significantly improved from previous measurements, CMP without evidence of an AKI or significant electrolyte dysfunction.  CT Stone:  IMPRESSION:  No bowel obstruction, free air or free fluid. Scattered stool.  Normal appendix.    No obstructing renal stone.    Sequela of known sickle-cell disease with a small calcified spleen,  bony sclerotic changes. There are however additional degenerative  changes along the spine with areas of disc bulging and canal  stenosis. Please correlate with particular symptoms and additional  evaluation as clinically appropriate    Patient was administered multiple rounds of Dilaudid in addition to Benadryl and Zofran and a 0.45% NaCl infusion.  Considered admission for observation versus discharge, after discussion with the patient who prefer discharge and continued recovery at home.  He was tolerating oral intake without episodes of emesis.  He was feeling symptomatically improved on repeat assessment, pain well improved.  Continued oral rehydration advised outpatient, continued pain control outpatient and follow-up with the sickle cell clinic.  Stable for discharge.   Final Clinical Impression(s) / ED Diagnoses Final diagnoses:  Sickle cell pain crisis [facility])    Rx / DC Orders ED Discharge Orders     None         Ernie Avena, MD 01/13/23 1254

## 2023-01-13 NOTE — ED Provider Triage Note (Signed)
Emergency Medicine Provider Triage Evaluation Note  Michael Mullins , a 42 y.o. male  was evaluated in triage.  Pt complains of sickle cell pain crisis.  States that pain began in his right leg last night.  He took some ibuprofen and went to bed.  States that he woke up 2 AM with worsening pain and took an oxycodone and went back to bed.  States that he woke up today with ongoing pain.  He called the sickle cell clinic but nobody answered so he presents here.  Complaining of ongoing pain in the right thigh primarily.  Denies any chest or abdominal pain.  Denies shortness of breath..  Review of Systems  Positive:  Negative: See above  Physical Exam  BP 120/85 (BP Location: Right Arm)   Pulse 67   Temp 98.1 F (36.7 C) (Oral)   Resp 16   Ht 5\' 10"  (1.778 m)   Wt 108.9 kg   SpO2 98%   BMI 34.44 kg/m  Gen:   Awake, no distress   Resp:  Normal effort  MSK:   Moves extremities without difficulty  Other:  DP pulse in the right leg palpable.  Leg is warm.  Medical Decision Making  Medically screening exam initiated at 9:36 AM.  Appropriate orders placed.  Michael Mullins was informed that the remainder of the evaluation will be completed by another provider, this initial triage assessment does not replace that evaluation, and the importance of remaining in the ED until their evaluation is complete.     Cristopher Peru, PA-C 01/13/23 [phone]

## 2023-01-13 NOTE — Discharge Instructions (Addendum)
Please follow-up with the  sickle cell center, return for any severe worsening symptoms, continue your home opiate pain regimen.

## 2023-01-13 NOTE — ED Triage Notes (Signed)
Pt coming in today complaining of sickle cell pain that began last night. Pt states that he feels the pain mostly in his right leg.

## 2023-01-15 ENCOUNTER — Other Ambulatory Visit (HOSPITAL_COMMUNITY): Payer: Self-pay

## 2023-01-15 ENCOUNTER — Other Ambulatory Visit: Payer: Self-pay

## 2023-01-16 ENCOUNTER — Other Ambulatory Visit (HOSPITAL_COMMUNITY): Payer: Self-pay

## 2023-01-16 MED ORDER — NALOXONE HCL 4 MG/0.1ML NA LIQD
1.0000 | NASAL | 0 refills | Status: AC | PRN
  Filled 2023-01-16: qty 2, 15d supply, fill #0

## 2023-01-16 MED ORDER — XTAMPZA ER 18 MG PO C12A
1.0000 | EXTENDED_RELEASE_CAPSULE | Freq: Three times a day (TID) | ORAL | 0 refills | Status: DC
  Filled 2023-01-16: qty 90, 30d supply, fill #0
  Filled 2023-01-23: qty 60, 30d supply, fill #0

## 2023-01-16 MED ORDER — OXYCODONE HCL 15 MG PO TABS
15.0000 mg | ORAL_TABLET | Freq: Three times a day (TID) | ORAL | 0 refills | Status: DC | PRN
Start: 2023-01-15 — End: 2023-11-23
  Filled 2023-01-16: qty 150, 30d supply, fill #0

## 2023-01-20 ENCOUNTER — Other Ambulatory Visit: Payer: Self-pay

## 2023-01-23 ENCOUNTER — Other Ambulatory Visit (HOSPITAL_COMMUNITY): Payer: Self-pay

## 2023-01-23 ENCOUNTER — Other Ambulatory Visit: Payer: Self-pay

## 2023-01-30 ENCOUNTER — Other Ambulatory Visit (HOSPITAL_COMMUNITY): Payer: Self-pay

## 2023-01-30 MED ORDER — XTAMPZA ER 27 MG PO C12A
1.0000 | EXTENDED_RELEASE_CAPSULE | Freq: Two times a day (BID) | ORAL | 0 refills | Status: DC
Start: 2023-01-30 — End: 2023-03-18
  Filled 2023-01-30: qty 60, 30d supply, fill #0

## 2023-02-04 ENCOUNTER — Other Ambulatory Visit (HOSPITAL_COMMUNITY): Payer: Self-pay

## 2023-02-21 ENCOUNTER — Other Ambulatory Visit: Payer: Self-pay

## 2023-02-24 ENCOUNTER — Other Ambulatory Visit: Payer: Self-pay

## 2023-02-24 ENCOUNTER — Other Ambulatory Visit (HOSPITAL_COMMUNITY): Payer: Self-pay

## 2023-02-24 MED ORDER — HUMIRA (2 PEN) 40 MG/0.8ML ~~LOC~~ AJKT
40.0000 mg | AUTO-INJECTOR | SUBCUTANEOUS | 2 refills | Status: DC
Start: 2023-02-24 — End: 2023-02-26
  Filled 2023-02-24 – 2023-02-26 (×2): qty 1.6, 28d supply, fill #0

## 2023-02-26 ENCOUNTER — Other Ambulatory Visit (HOSPITAL_COMMUNITY): Payer: Self-pay

## 2023-02-26 ENCOUNTER — Other Ambulatory Visit: Payer: Self-pay

## 2023-02-26 ENCOUNTER — Other Ambulatory Visit: Payer: Self-pay | Admitting: Pharmacist

## 2023-02-26 MED ORDER — HUMIRA (2 PEN) 40 MG/0.8ML ~~LOC~~ AJKT
40.0000 mg | AUTO-INJECTOR | SUBCUTANEOUS | 2 refills | Status: DC
  Filled 2023-02-26 (×2): qty 1.6, 28d supply, fill #0
  Filled 2023-03-28: qty 1.6, 28d supply, fill #1
  Filled 2023-04-24: qty 1.6, 28d supply, fill #2

## 2023-02-28 ENCOUNTER — Other Ambulatory Visit (HOSPITAL_COMMUNITY): Payer: Self-pay

## 2023-02-28 MED ORDER — OXYCODONE HCL 15 MG PO TABS
15.0000 mg | ORAL_TABLET | Freq: Three times a day (TID) | ORAL | 0 refills | Status: DC
Start: 2023-02-28 — End: 2023-11-23
  Filled 2023-02-28: qty 150, 30d supply, fill #0

## 2023-03-01 ENCOUNTER — Encounter (HOSPITAL_COMMUNITY): Payer: Self-pay

## 2023-03-18 ENCOUNTER — Other Ambulatory Visit (HOSPITAL_COMMUNITY): Payer: Self-pay

## 2023-03-18 MED ORDER — XTAMPZA ER 27 MG PO C12A
27.0000 mg | EXTENDED_RELEASE_CAPSULE | Freq: Two times a day (BID) | ORAL | 0 refills | Status: DC
Start: 2023-03-18 — End: 2023-04-29
  Filled 2023-03-18 – 2023-03-28 (×3): qty 60, 30d supply, fill #0

## 2023-03-28 ENCOUNTER — Other Ambulatory Visit: Payer: Self-pay

## 2023-03-28 ENCOUNTER — Other Ambulatory Visit (HOSPITAL_COMMUNITY): Payer: Self-pay

## 2023-03-28 NOTE — Progress Notes (Signed)
Specialty Pharmacy Refill Coordination Note  Michael Mullins is a 42 y.o. male contacted today regarding refills of specialty medication(s) Adalimumab   Patient requested Delivery   Delivery date: 04/08/23   Verified address: [address]   [place] [address]   Medication will be filled on 04/07/23.

## 2023-03-31 DIAGNOSIS — Z79899 Other long term (current) drug therapy: Secondary | ICD-10-CM | POA: Diagnosis not present

## 2023-03-31 DIAGNOSIS — G8929 Other chronic pain: Secondary | ICD-10-CM | POA: Diagnosis not present

## 2023-03-31 DIAGNOSIS — D5700 Hb-SS disease with crisis, unspecified: Secondary | ICD-10-CM | POA: Diagnosis not present

## 2023-03-31 DIAGNOSIS — E559 Vitamin D deficiency, unspecified: Secondary | ICD-10-CM | POA: Diagnosis not present

## 2023-03-31 DIAGNOSIS — D571 Sickle-cell disease without crisis: Secondary | ICD-10-CM | POA: Diagnosis not present

## 2023-03-31 DIAGNOSIS — M7918 Myalgia, other site: Secondary | ICD-10-CM | POA: Diagnosis not present

## 2023-04-03 ENCOUNTER — Other Ambulatory Visit (HOSPITAL_COMMUNITY): Payer: Self-pay

## 2023-04-03 MED ORDER — OXYCODONE HCL 15 MG PO TABS
15.0000 mg | ORAL_TABLET | Freq: Three times a day (TID) | ORAL | 0 refills | Status: DC
Start: 2023-04-03 — End: 2023-11-23
  Filled 2023-04-03: qty 150, 25d supply, fill #0

## 2023-04-24 ENCOUNTER — Other Ambulatory Visit: Payer: Self-pay

## 2023-04-24 ENCOUNTER — Other Ambulatory Visit (HOSPITAL_COMMUNITY): Payer: Self-pay

## 2023-04-24 NOTE — Progress Notes (Signed)
Specialty Pharmacy Refill Coordination Note  Michael Mullins is a 42 y.o. male contacted today regarding refills of specialty medication(s) Adalimumab   Patient requested Delivery   Delivery date: 05/13/23   Verified address: [address]  [place] [address]   Medication will be filled on 05/12/23.

## 2023-04-29 ENCOUNTER — Other Ambulatory Visit (HOSPITAL_COMMUNITY): Payer: Self-pay

## 2023-04-29 MED ORDER — XTAMPZA ER 27 MG PO C12A
1.0000 | EXTENDED_RELEASE_CAPSULE | Freq: Two times a day (BID) | ORAL | 0 refills | Status: DC
Start: 2023-04-29 — End: 2023-06-02
  Filled 2023-04-29: qty 60, 30d supply, fill #0

## 2023-04-29 MED ORDER — OXYCODONE HCL 15 MG PO TABS
15.0000 mg | ORAL_TABLET | Freq: Three times a day (TID) | ORAL | 0 refills | Status: DC | PRN
Start: 2023-04-29 — End: 2023-06-02
  Filled 2023-04-29: qty 150, 50d supply, fill #0
  Filled 2023-05-06: qty 150, 30d supply, fill #0

## 2023-04-30 ENCOUNTER — Encounter (HOSPITAL_COMMUNITY): Payer: Self-pay | Admitting: *Deleted

## 2023-04-30 ENCOUNTER — Emergency Department (HOSPITAL_COMMUNITY)
Admission: EM | Admit: 2023-04-30 | Discharge: 2023-04-30 | Disposition: A | Discharge status: Home / Self Care | Payer: Commercial Managed Care - PPO | Attending: Emergency Medicine | Admitting: Emergency Medicine

## 2023-04-30 ENCOUNTER — Other Ambulatory Visit: Payer: Self-pay

## 2023-04-30 DIAGNOSIS — D5700 Hb-SS disease with crisis, unspecified: Secondary | ICD-10-CM | POA: Insufficient documentation

## 2023-04-30 LAB — CBC WITH DIFFERENTIAL/PLATELET
Abs Immature Granulocytes: 0.05 10*3/uL (ref 0.00–0.07)
Basophils Absolute: 0 10*3/uL (ref 0.0–0.1)
Basophils Relative: 0 %
Eosinophils Absolute: 0.1 10*3/uL (ref 0.0–0.5)
Eosinophils Relative: 1 %
HCT: 23.7 % — ABNORMAL LOW (ref 39.0–52.0)
Hemoglobin: 8.6 g/dL — ABNORMAL LOW (ref 13.0–17.0)
Immature Granulocytes: 1 %
Lymphocytes Relative: 28 %
Lymphs Abs: 2.9 10*3/uL (ref 0.7–4.0)
MCH: 32.2 pg (ref 26.0–34.0)
MCHC: 36.3 g/dL — ABNORMAL HIGH (ref 30.0–36.0)
MCV: 88.8 fL (ref 80.0–100.0)
Monocytes Absolute: 2.4 10*3/uL — ABNORMAL HIGH (ref 0.1–1.0)
Monocytes Relative: 23 %
Neutro Abs: 4.9 10*3/uL (ref 1.7–7.7)
Neutrophils Relative %: 47 %
Platelets: 303 10*3/uL (ref 150–400)
RBC: 2.67 MIL/uL — ABNORMAL LOW (ref 4.22–5.81)
RDW: 24.2 % — ABNORMAL HIGH (ref 11.5–15.5)
WBC: 10.4 10*3/uL (ref 4.0–10.5)
nRBC: 0.6 % — ABNORMAL HIGH (ref 0.0–0.2)

## 2023-04-30 LAB — COMPREHENSIVE METABOLIC PANEL
ALT: 21 U/L (ref 0–44)
AST: 33 U/L (ref 15–41)
Albumin: 4.1 g/dL (ref 3.5–5.0)
Alkaline Phosphatase: 50 U/L (ref 38–126)
Anion gap: 7 (ref 5–15)
BUN: 10 mg/dL (ref 6–20)
CO2: 24 mmol/L (ref 22–32)
Calcium: 8.5 mg/dL — ABNORMAL LOW (ref 8.9–10.3)
Chloride: 102 mmol/L (ref 98–111)
Creatinine, Ser: 0.63 mg/dL (ref 0.61–1.24)
GFR, Estimated: >60 mL/min (ref >60)
Glucose, Bld: 117 mg/dL — ABNORMAL HIGH (ref 70–99)
Potassium: 3.6 mmol/L (ref 3.5–5.1)
Sodium: 133 mmol/L — ABNORMAL LOW (ref 135–145)
Total Bilirubin: 5.8 mg/dL — ABNORMAL HIGH (ref <1.2)
Total Protein: 7.5 g/dL (ref 6.5–8.1)

## 2023-04-30 LAB — RETICULOCYTES
Immature Retic Fract: 38.6 % — ABNORMAL HIGH (ref 2.3–15.9)
RBC.: 2.64 MIL/uL — ABNORMAL LOW (ref 4.22–5.81)
Retic Count, Absolute: 443.8 10*3/uL — ABNORMAL HIGH (ref 19.0–186.0)
Retic Ct Pct: 16.8 % — ABNORMAL HIGH (ref 0.4–3.1)

## 2023-04-30 MED ORDER — ONDANSETRON HCL 4 MG/2ML IJ SOLN
4.0000 mg | Freq: Once | INTRAMUSCULAR | Status: AC
  Administered 2023-04-30: 4 mg via INTRAVENOUS
  Filled 2023-04-30: qty 2

## 2023-04-30 MED ORDER — HYDROMORPHONE HCL 1 MG/ML IJ SOLN
1.0000 mg | Freq: Once | INTRAMUSCULAR | Status: AC
  Administered 2023-04-30: 1 mg via INTRAVENOUS
  Filled 2023-04-30: qty 1

## 2023-04-30 MED ORDER — DIPHENHYDRAMINE HCL 25 MG PO CAPS
25.0000 mg | ORAL_CAPSULE | Freq: Once | ORAL | Status: AC
  Administered 2023-04-30: 25 mg via ORAL
  Filled 2023-04-30: qty 1

## 2023-04-30 MED ORDER — SODIUM CHLORIDE 0.45 % IV SOLN: INTRAVENOUS | Status: DC

## 2023-04-30 NOTE — ED Triage Notes (Signed)
Pt c/o lower back pain that started last night around 2230.

## 2023-04-30 NOTE — Discharge Instructions (Signed)
Your lab tests today are reassuring, make sure you get your oxycodone picked up in the event you have breakthrough pain.  Plan follow-up care with your primary provider, but feel free to return here if you have any new or worsening symptoms and cannot be seen by your established MDs.

## 2023-04-30 NOTE — ED Provider Notes (Signed)
[place] EMERGENCY DEPARTMENT AT [facility] Provider Note   CSN: [id number] Arrival date & time: 04/30/23  0818     History  Chief Complaint  Patient presents with   Sickle Cell Pain Crisis    Michael Mullins is a 42 y.o. male with a longstanding history of sickle cell disease, under the care of of [facility] with infrequent episodes of pain crisis.  He woke this morning around 6 AM with pain in his lower back it is now escalating into his upper back and he states this is consistent with prior sickle cell flares.  He takes Xtampza for his chronic pain with Roxicodone tablets for as needed breakthrough pain.  He has run out of his Roxicodone, he feels he probably would be able to control his symptoms at home if he had not run out of this medication.  He has taken his Xtampza prior to arrival.  He denies fevers, chest pain, shortness of breath and abdominal pain.  He states his baseline hgb is between 7-8.   The history is provided by the patient.       Home Medications Prior to Admission medications   Medication Sig Start Date End Date Taking? Authorizing Provider  adalimumab (HUMIRA, 2 PEN,) 40 MG/0.8ML AJKT pen Inject 0.8 mLs (40 mg total) into the skin every 14 (fourteen) days. 02/26/23   Quentin Angst, MD  Clindamycin-Benzoyl Per, Refr, gel Apply topically daily to face 11/20/22     clobetasol (TEMOVATE) 0.05 % external solution Apply 1 Application topically daily to area on scalp. Patient taking differently: Apply 1 Application topically daily. When compliant 02/13/22     clobetasol (TEMOVATE) 0.05 % external solution Apply 1 Application topically daily to area on scalp 11/20/22     empagliflozin (JARDIANCE) 10 MG TABS tablet Take 1 tablet (10 mg total) by mouth daily. 12/30/22     ergocalciferol (VITAMIN D2) 1.25 MG (50000 UT) capsule Take 1 capsule (50,000 Units total) by mouth every 30 (thirty) days. 12/06/21     ergocalciferol (VITAMIN D2) 1.25 MG (50000 UT)  capsule Take 1 capsule (50,000 Units total) by mouth every 30 (thirty) days. 03/25/22     ferrous sulfate (FEROSUL) 325 (65 FE) MG tablet Take by mouth. Patient not taking: Reported on 08/27/2022 08/07/20     folic acid (FOLVITE) 1 MG tablet Take 1 tablet (1 mg total) by mouth daily. 11/28/21     folic acid (FOLVITE) 1 MG tablet Take 1 tablet (1 mg total) by mouth daily. 07/09/22     folic acid (FOLVITE) 1 MG tablet Take 1 tablet (1 mg total) by mouth daily. 08/29/22   Massie Maroon, FNP  glucose blood (PRECISION QID TEST) test strip Use 1 strip to check blood sugar daily. 03/22/22     GNP Alcohol Swabs 70 % PADS Use 1 swab as needed to clean site for blood sugar check. 03/22/22     ibuprofen (ADVIL) 800 MG tablet Take 1 tablet (800 mg total) by mouth every 8 (eight) hours as needed for Pain or Fever. Patient taking differently: Take 800 mg by mouth as needed for moderate pain. 10/08/21     Lancets (FREESTYLE) lancets Use 1 lancet to lance skin to check blood sugar once daily. 03/22/22     lisinopril (ZESTRIL) 2.5 MG tablet Take 1 tablet (2.5 mg total) by mouth daily. 12/30/22     meclizine (ANTIVERT) 25 MG tablet Take 1 tablet (25 mg total) by mouth 3 (three)  times daily as needed. Patient taking differently: Take 25 mg by mouth as needed for dizziness or nausea. 10/11/21     naloxone (NARCAN) nasal spray 4 mg/0.1 mL Use as directed every 2-3 minutes until emergency arrives, call 911 Patient not taking: Reported on 08/27/2022 08/07/20     naloxone (NARCAN) nasal spray 4 mg/0.1 mL Place 1 spray into one nostril as needed. (for excessive sedation) 01/15/23     ondansetron (ZOFRAN-ODT) 4 MG disintegrating tablet Take 1 tablet (4 mg total) by mouth every 8 (eight) hours as needed for nausea or vomiting. 01/13/23   Ernie Avena, MD  OXBRYTA 500 MG TABS tablet Take 1,500 mg by mouth daily. 01/01/21   [provider]  oxyCODONE (ROXICODONE) 15 MG immediate release tablet Take 1 - 2 tablets every 8 hours  as needed for break through pain based on pain levels. (30 day supply) 01/15/23     oxyCODONE (ROXICODONE) 15 MG immediate release tablet Take 1-2 tablets (15-30 mg total) by mouth every 8 (eight) hours as needed for break through pain based on pain levels. 02/28/23     oxyCODONE (ROXICODONE) 15 MG immediate release tablet Take 1-2 tablets (15-30 mg total) by mouth every 8 (eight) hours as needed for break through pain based on pain levels. 04/03/23     oxyCODONE (ROXICODONE) 15 MG immediate release tablet Take 1 tablet (15 mg total) by mouth every 8 (eight) hours as needed for pain. take 1-2 tablet (15-30 mg total) every 8 hours as needed for break through pain based on pain levels 04/29/23     oxyCODONE ER (XTAMPZA ER) 27 MG C12A Take 1 capsule by mouth 2 (two) times daily. 04/29/23     rosuvastatin (CRESTOR) 5 MG tablet Take 1 tablet (5 mg total) by mouth daily for cholesterol 10/01/22     triamcinolone cream (KENALOG) 0.1 % Apply to affected skin of arms twice daily as needed. NEVER to face/neck/groin. Patient not taking: Reported on 08/27/2022 11/28/20     Vitamin D, Ergocalciferol, (DRISDOL) 1.25 MG (50000 UNIT) CAPS capsule Take 1 capsule (50,000 Units total) by mouth every 30 (thirty) days. 09/09/22         Allergies    Morphine and Vancomycin    Review of Systems   Review of Systems  Constitutional:  Negative for chills.  Eyes: Negative.   Respiratory:  Negative for chest tightness.   Gastrointestinal:  Negative for abdominal pain.  Genitourinary: Negative.   Musculoskeletal:  Positive for back pain. Negative for arthralgias, joint swelling and neck pain.  Skin: Negative.  Negative for rash and wound.  Neurological:  Negative for dizziness, weakness, light-headedness and numbness.  Psychiatric/Behavioral: Negative.    All other systems reviewed and are negative.   Physical Exam Updated Vital Signs BP (!) 115/55 (BP Location: Right Arm)   Pulse 62   Temp 98.6 F (37 C) (Oral)   Resp  18   Ht 5\' 11"  (1.803 m)   Wt 104.3 kg   SpO2 95%   BMI 32.08 kg/m  Physical Exam Vitals and nursing note reviewed.  Constitutional:      Appearance: He is well-developed.  HENT:     Head: Normocephalic and atraumatic.  Eyes:     Conjunctiva/sclera: Conjunctivae normal.  Cardiovascular:     Rate and Rhythm: Normal rate and regular rhythm.     Pulses: Normal pulses.     Heart sounds: Normal heart sounds.  Pulmonary:     Effort: Pulmonary effort is normal.  Breath sounds: Normal breath sounds. No wheezing.  Abdominal:     General: Bowel sounds are normal.     Palpations: Abdomen is soft.     Tenderness: There is no abdominal tenderness.  Musculoskeletal:        General: Normal range of motion.     Cervical back: Normal range of motion.     Right lower leg: No edema.     Left lower leg: No edema.  Skin:    General: Skin is warm and dry.  Neurological:     General: No focal deficit present.     Mental Status: He is alert and oriented to person, place, and time.     ED Results / Procedures / Treatments   Labs (all labs ordered are listed, but only abnormal results are displayed) Labs Reviewed  COMPREHENSIVE METABOLIC PANEL - Abnormal; Notable for the following components:      Result Value   Sodium 133 (*)    Glucose, Bld 117 (*)    Calcium 8.5 (*)    Total Bilirubin 5.8 (*)    All other components within normal limits  CBC WITH DIFFERENTIAL/PLATELET - Abnormal; Notable for the following components:   RBC 2.67 (*)    Hemoglobin 8.6 (*)    HCT 23.7 (*)    MCHC 36.3 (*)    RDW 24.2 (*)    nRBC 0.6 (*)    Monocytes Absolute 2.4 (*)    All other components within normal limits  RETICULOCYTES - Abnormal; Notable for the following components:   Retic Ct Pct 16.8 (*)    RBC. 2.64 (*)    Retic Count, Absolute 443.8 (*)    Immature Retic Fract 38.6 (*)    All other components within normal limits    EKG None  Radiology No results  found.  Procedures Procedures    Medications Ordered in ED Medications  0.45 % sodium chloride infusion ( Intravenous New Bag/Given 04/30/23 1003)  ondansetron (ZOFRAN) injection 4 mg (4 mg Intravenous Given 04/30/23 0959)  HYDROmorphone (DILAUDID) injection 1 mg (1 mg Intravenous Given 04/30/23 1003)  diphenhydrAMINE (BENADRYL) capsule 25 mg (25 mg Oral Given 04/30/23 1004)    ED Course/ Medical Decision Making/ A&P                                 Medical Decision Making Patient presenting with flare of his sickle cell pain.  He has run out of his breakthrough oxycodone tablets, stating if he had this medication he probably would have been able to control his symptoms at home.  He was given IV fluids along with a one-time dose of Dilaudid 1 mg after which his symptoms were significantly improved.  He was able to tolerate p.o. intake here.  Labs per below are stable and appropriate for his condition.  He states that he has reached out to his sickle cell doctor and there will be a prescription of his oxycodone waiting for him at his pharmacy, he feels comfortable leaving at this time.  Amount and/or Complexity of Data Reviewed Labs: ordered.    Details: Labs significant for hemoglobin of 8.6, his c-Met is relatively stable, his bilirubin is 5.8 which is consistent with prior results.  Additionally her retake count is 16.8, appropriate for flare.           Final Clinical Impression(s) / ED Diagnoses Final diagnoses:  Sickle cell pain crisis (HCC)  Rx / DC Orders ED Discharge Orders     None         Victoriano Lain 04/30/23 1152    Rondel Baton, MD 05/03/23 [id number]

## 2023-05-06 ENCOUNTER — Other Ambulatory Visit (HOSPITAL_COMMUNITY): Payer: Self-pay

## 2023-05-12 ENCOUNTER — Other Ambulatory Visit: Payer: Self-pay

## 2023-06-02 ENCOUNTER — Other Ambulatory Visit (HOSPITAL_COMMUNITY): Payer: Self-pay

## 2023-06-02 MED ORDER — OXYCODONE HCL 15 MG PO TABS
15.0000 mg | ORAL_TABLET | Freq: Three times a day (TID) | ORAL | 0 refills | Status: DC | PRN
  Filled 2023-06-03: qty 150, 25d supply, fill #0

## 2023-06-02 MED ORDER — XTAMPZA ER 27 MG PO C12A
27.0000 mg | EXTENDED_RELEASE_CAPSULE | Freq: Two times a day (BID) | ORAL | 0 refills | Status: DC
  Filled 2023-06-02: qty 60, 30d supply, fill #0

## 2023-06-03 ENCOUNTER — Other Ambulatory Visit (HOSPITAL_COMMUNITY): Payer: Self-pay

## 2023-06-03 ENCOUNTER — Other Ambulatory Visit (HOSPITAL_COMMUNITY): Payer: Self-pay | Admitting: Pharmacy Technician

## 2023-06-03 NOTE — Progress Notes (Signed)
Specialty Pharmacy Refill Coordination Note  Michael Mullins is a 42 y.o. male contacted today regarding refills of specialty medication(s) Adalimumab (Humira (2 Pen))   Patient requested Delivery   Delivery date: 06/10/23   Verified address: [address]  [place] [address]   Medication will be filled on 06/09/23.  Refill Request sent to MD; Call if any delays. Send to [facility] for Re-write.  Patient aware of shipping  Ship 12/30 for 12/31 or 1/02 for 01/03

## 2023-06-11 DIAGNOSIS — I83009 Varicose veins of unspecified lower extremity with ulcer of unspecified site: Secondary | ICD-10-CM

## 2023-06-11 HISTORY — DX: Non-pressure chronic ulcer of unspecified part of unspecified lower leg with unspecified severity: I83.009

## 2023-06-12 ENCOUNTER — Other Ambulatory Visit: Payer: Self-pay

## 2023-06-13 ENCOUNTER — Other Ambulatory Visit: Payer: Self-pay

## 2023-06-20 ENCOUNTER — Encounter (HOSPITAL_COMMUNITY): Payer: Self-pay

## 2023-06-20 ENCOUNTER — Other Ambulatory Visit: Payer: Self-pay

## 2023-06-20 ENCOUNTER — Other Ambulatory Visit (HOSPITAL_COMMUNITY): Payer: Self-pay

## 2023-06-20 ENCOUNTER — Other Ambulatory Visit: Payer: Self-pay | Admitting: Pharmacist

## 2023-06-20 DIAGNOSIS — M272 Inflammatory conditions of jaws: Secondary | ICD-10-CM | POA: Diagnosis not present

## 2023-06-20 DIAGNOSIS — E119 Type 2 diabetes mellitus without complications: Secondary | ICD-10-CM | POA: Diagnosis not present

## 2023-06-20 DIAGNOSIS — R519 Headache, unspecified: Secondary | ICD-10-CM | POA: Diagnosis not present

## 2023-06-20 MED ORDER — DEXCOM G7 SENSOR MISC
1.0000 | 3 refills | Status: DC
  Filled 2023-06-20 – 2023-06-25 (×2): qty 9, 90d supply, fill #0
  Filled 2023-06-27: qty 3, 30d supply, fill #0

## 2023-06-20 MED ORDER — HUMIRA (2 PEN) 40 MG/0.8ML ~~LOC~~ AJKT
AUTO-INJECTOR | SUBCUTANEOUS | 2 refills | Status: DC
  Filled 2023-06-20: qty 2, 28d supply, fill #0
  Filled 2023-06-20: qty 1.6, fill #0
  Filled 2023-07-21: qty 2, 28d supply, fill #1
  Filled 2023-08-19: qty 2, 28d supply, fill #2

## 2023-06-20 MED ORDER — HUMIRA (2 PEN) 40 MG/0.8ML ~~LOC~~ AJKT: AUTO-INJECTOR | SUBCUTANEOUS | 2 refills | Status: DC

## 2023-06-20 NOTE — Progress Notes (Signed)
 Please see documentation from 12/2022 for detailed documentation.   Butch Penny, PharmD, Patsy [place], CPP Clinical Pharmacist [facility] [phone]

## 2023-06-23 ENCOUNTER — Other Ambulatory Visit: Payer: Self-pay

## 2023-06-23 ENCOUNTER — Other Ambulatory Visit (HOSPITAL_COMMUNITY): Payer: Self-pay

## 2023-06-23 NOTE — Progress Notes (Signed)
 Pharmacy Patient Advocate Encounter   Received notification from Patient Pharmacy that prior authorization for Humira  is required/requested.   Insurance verification completed.   The patient is insured through [facility] .   Per test claim: PA required; PA submitted to above mentioned insurance via CoverMyMeds Key/confirmation #/EOC [id number] Status is pending

## 2023-06-24 ENCOUNTER — Other Ambulatory Visit: Payer: Self-pay

## 2023-06-24 ENCOUNTER — Other Ambulatory Visit (HOSPITAL_COMMUNITY): Payer: Self-pay

## 2023-06-24 NOTE — Progress Notes (Addendum)
 Pharmacy Patient Advocate Encounter  Received notification from [facility] that Prior Authorization for Humira has been APPROVED from 06/24/23 to 06/22/24   PA #/Case ID/Reference #: [id number]

## 2023-06-25 ENCOUNTER — Other Ambulatory Visit (HOSPITAL_COMMUNITY): Payer: Self-pay

## 2023-06-27 ENCOUNTER — Other Ambulatory Visit (HOSPITAL_COMMUNITY): Payer: Self-pay

## 2023-07-02 ENCOUNTER — Other Ambulatory Visit (HOSPITAL_COMMUNITY): Payer: Self-pay

## 2023-07-02 MED ORDER — OXYCODONE HCL 15 MG PO TABS
15.0000 mg | ORAL_TABLET | Freq: Three times a day (TID) | ORAL | 0 refills | Status: DC | PRN
  Filled 2023-07-02: qty 150, 25d supply, fill #0

## 2023-07-02 MED ORDER — XTAMPZA ER 27 MG PO C12A
27.0000 mg | EXTENDED_RELEASE_CAPSULE | Freq: Two times a day (BID) | ORAL | 0 refills | Status: DC
  Filled 2023-07-02: qty 60, 30d supply, fill #0

## 2023-07-03 ENCOUNTER — Other Ambulatory Visit (HOSPITAL_COMMUNITY): Payer: Self-pay

## 2023-07-07 DIAGNOSIS — D571 Sickle-cell disease without crisis: Secondary | ICD-10-CM | POA: Diagnosis not present

## 2023-07-07 DIAGNOSIS — E559 Vitamin D deficiency, unspecified: Secondary | ICD-10-CM | POA: Diagnosis not present

## 2023-07-07 DIAGNOSIS — R519 Headache, unspecified: Secondary | ICD-10-CM | POA: Diagnosis not present

## 2023-07-07 DIAGNOSIS — R2991 Unspecified symptoms and signs involving the musculoskeletal system: Secondary | ICD-10-CM | POA: Diagnosis not present

## 2023-07-14 ENCOUNTER — Other Ambulatory Visit: Payer: Self-pay

## 2023-07-16 ENCOUNTER — Other Ambulatory Visit (HOSPITAL_COMMUNITY): Payer: Self-pay

## 2023-07-16 DIAGNOSIS — D571 Sickle-cell disease without crisis: Secondary | ICD-10-CM | POA: Diagnosis not present

## 2023-07-16 DIAGNOSIS — R519 Headache, unspecified: Secondary | ICD-10-CM | POA: Diagnosis not present

## 2023-07-18 ENCOUNTER — Other Ambulatory Visit (HOSPITAL_COMMUNITY): Payer: Self-pay

## 2023-07-18 ENCOUNTER — Encounter (HOSPITAL_COMMUNITY): Payer: Self-pay

## 2023-07-21 ENCOUNTER — Other Ambulatory Visit: Payer: Self-pay

## 2023-07-21 ENCOUNTER — Other Ambulatory Visit (HOSPITAL_COMMUNITY): Payer: Self-pay

## 2023-07-21 NOTE — Progress Notes (Signed)
 Specialty Pharmacy Refill Coordination Note  Michael Mullins is a 43 y.o. male contacted today regarding refills of specialty medication(s) Adalimumab  (Humira  (2 Pen))   Patient requested Delivery   Delivery date: 07/22/23   Verified address: [address]  [place] [address]   Medication will be filled on 07/21/23.

## 2023-08-01 ENCOUNTER — Other Ambulatory Visit (HOSPITAL_COMMUNITY): Payer: Self-pay

## 2023-08-01 MED ORDER — XTAMPZA ER 27 MG PO C12A
27.0000 mg | EXTENDED_RELEASE_CAPSULE | Freq: Two times a day (BID) | ORAL | 0 refills | Status: DC
  Filled 2023-08-01: qty 60, 30d supply, fill #0

## 2023-08-01 MED ORDER — OXYCODONE HCL 15 MG PO TABS
15.0000 mg | ORAL_TABLET | Freq: Three times a day (TID) | ORAL | 0 refills | Status: DC | PRN
  Filled 2023-08-01: qty 150, 30d supply, fill #0

## 2023-08-07 ENCOUNTER — Other Ambulatory Visit: Payer: Self-pay

## 2023-08-14 ENCOUNTER — Other Ambulatory Visit: Payer: Self-pay

## 2023-08-18 ENCOUNTER — Other Ambulatory Visit (HOSPITAL_COMMUNITY): Payer: Self-pay

## 2023-08-19 ENCOUNTER — Other Ambulatory Visit (HOSPITAL_COMMUNITY): Payer: Self-pay

## 2023-08-19 ENCOUNTER — Other Ambulatory Visit: Payer: Self-pay

## 2023-08-19 NOTE — Progress Notes (Signed)
 Specialty Pharmacy Refill Coordination Note  Michael Mullins is a 43 y.o. male contacted today regarding refills of specialty medication(s) Adalimumab (Humira (2 Pen))   Patient requested Delivery   Delivery date: 08/21/23   Verified address: [address] [place] [address]   Medication will be filled on 08/20/23.

## 2023-08-29 ENCOUNTER — Other Ambulatory Visit (HOSPITAL_COMMUNITY): Payer: Self-pay

## 2023-08-29 MED ORDER — OXYCODONE HCL 15 MG PO TABS
15.0000 mg | ORAL_TABLET | Freq: Three times a day (TID) | ORAL | 0 refills | Status: DC
  Filled 2023-09-02: qty 150, 30d supply, fill #0

## 2023-08-29 MED ORDER — XTAMPZA ER 27 MG PO C12A
1.0000 | EXTENDED_RELEASE_CAPSULE | Freq: Two times a day (BID) | ORAL | 0 refills | Status: DC
  Filled 2023-09-02: qty 60, 30d supply, fill #0

## 2023-09-02 ENCOUNTER — Other Ambulatory Visit (HOSPITAL_COMMUNITY): Payer: Self-pay

## 2023-09-03 ENCOUNTER — Other Ambulatory Visit: Payer: Self-pay

## 2023-09-08 ENCOUNTER — Other Ambulatory Visit (HOSPITAL_COMMUNITY): Payer: Self-pay

## 2023-09-10 ENCOUNTER — Other Ambulatory Visit: Payer: Self-pay

## 2023-09-10 ENCOUNTER — Other Ambulatory Visit: Payer: Self-pay | Admitting: Pharmacist

## 2023-09-10 ENCOUNTER — Other Ambulatory Visit: Payer: Self-pay | Admitting: Pharmacy Technician

## 2023-09-10 ENCOUNTER — Other Ambulatory Visit (HOSPITAL_COMMUNITY): Payer: Self-pay

## 2023-09-10 MED ORDER — HUMIRA (2 PEN) 40 MG/0.8ML ~~LOC~~ AJKT: AUTO-INJECTOR | SUBCUTANEOUS | 3 refills | Status: DC

## 2023-09-10 MED ORDER — HUMIRA (2 PEN) 40 MG/0.8ML ~~LOC~~ AJKT
AUTO-INJECTOR | SUBCUTANEOUS | 3 refills | Status: DC
  Filled 2023-09-10: qty 2, 28d supply, fill #0
  Filled 2023-10-10: qty 2, 28d supply, fill #1
  Filled 2023-11-10: qty 2, 28d supply, fill #2
  Filled 2023-12-15: qty 2, 28d supply, fill #3

## 2023-09-10 NOTE — Progress Notes (Signed)
 Specialty Pharmacy Refill Coordination Note  Michael Mullins is a 43 y.o. male contacted today regarding refills of specialty medication(s) Adalimumab (Humira (2 Pen))   Patient requested Delivery   Delivery date: 09/17/23   Verified address: [address]  [place]   Medication will be filled on 09/16/23.  RR sent to MD; Send to [facility] for re-write.

## 2023-09-29 ENCOUNTER — Other Ambulatory Visit (HOSPITAL_COMMUNITY): Payer: Self-pay

## 2023-09-29 MED ORDER — XTAMPZA ER 27 MG PO C12A
27.0000 mg | EXTENDED_RELEASE_CAPSULE | Freq: Two times a day (BID) | ORAL | 0 refills | Status: DC
  Filled 2023-09-29 – 2023-10-01 (×2): qty 60, 30d supply, fill #0

## 2023-09-29 MED ORDER — OXYCODONE HCL 15 MG PO TABS
15.0000 mg | ORAL_TABLET | Freq: Three times a day (TID) | ORAL | 0 refills | Status: DC
  Filled 2023-09-29: qty 150, 50d supply, fill #0
  Filled 2023-10-01: qty 150, 30d supply, fill #0

## 2023-10-01 ENCOUNTER — Other Ambulatory Visit (HOSPITAL_COMMUNITY): Payer: Self-pay

## 2023-10-10 ENCOUNTER — Other Ambulatory Visit: Payer: Self-pay

## 2023-10-10 NOTE — Progress Notes (Signed)
 Specialty Pharmacy Refill Coordination Note  DAISHON HEDMAN is a 43 y.o. male contacted today regarding refills of specialty medication(s) Adalimumab  (Humira  (2 Pen))   Patient requested Delivery   Delivery date: 10/15/23   Verified address: [address]  [place]   Medication will be filled on 10/14/23.

## 2023-10-14 ENCOUNTER — Other Ambulatory Visit: Payer: Self-pay

## 2023-10-29 ENCOUNTER — Other Ambulatory Visit (HOSPITAL_COMMUNITY): Payer: Self-pay

## 2023-10-29 ENCOUNTER — Other Ambulatory Visit: Payer: Self-pay

## 2023-10-29 MED ORDER — OXYCODONE HCL 15 MG PO TABS
15.0000 mg | ORAL_TABLET | Freq: Three times a day (TID) | ORAL | 0 refills | Status: DC | PRN
  Filled 2023-10-29: qty 150, 30d supply, fill #0

## 2023-10-29 MED ORDER — XTAMPZA ER 27 MG PO C12A
27.0000 mg | EXTENDED_RELEASE_CAPSULE | Freq: Two times a day (BID) | ORAL | 0 refills | Status: DC
  Filled 2023-10-29: qty 60, 30d supply, fill #0

## 2023-11-05 ENCOUNTER — Other Ambulatory Visit: Payer: Self-pay

## 2023-11-07 ENCOUNTER — Other Ambulatory Visit (HOSPITAL_COMMUNITY): Payer: Self-pay

## 2023-11-07 DIAGNOSIS — Z0000 Encounter for general adult medical examination without abnormal findings: Secondary | ICD-10-CM | POA: Diagnosis not present

## 2023-11-07 DIAGNOSIS — E1159 Type 2 diabetes mellitus with other circulatory complications: Secondary | ICD-10-CM | POA: Diagnosis not present

## 2023-11-07 DIAGNOSIS — I878 Other specified disorders of veins: Secondary | ICD-10-CM | POA: Diagnosis not present

## 2023-11-07 DIAGNOSIS — Z872 Personal history of diseases of the skin and subcutaneous tissue: Secondary | ICD-10-CM | POA: Diagnosis not present

## 2023-11-07 DIAGNOSIS — I10 Essential (primary) hypertension: Secondary | ICD-10-CM | POA: Diagnosis not present

## 2023-11-07 DIAGNOSIS — E119 Type 2 diabetes mellitus without complications: Secondary | ICD-10-CM | POA: Diagnosis not present

## 2023-11-07 DIAGNOSIS — L03115 Cellulitis of right lower limb: Secondary | ICD-10-CM | POA: Diagnosis not present

## 2023-11-07 DIAGNOSIS — R220 Localized swelling, mass and lump, head: Secondary | ICD-10-CM | POA: Diagnosis not present

## 2023-11-07 DIAGNOSIS — D571 Sickle-cell disease without crisis: Secondary | ICD-10-CM | POA: Diagnosis not present

## 2023-11-07 DIAGNOSIS — M269 Dentofacial anomaly, unspecified: Secondary | ICD-10-CM | POA: Diagnosis not present

## 2023-11-07 MED ORDER — AMOXICILLIN-POT CLAVULANATE 875-125 MG PO TABS
1.0000 | ORAL_TABLET | Freq: Two times a day (BID) | ORAL | 0 refills | Status: AC
  Filled 2023-11-07: qty 20, 10d supply, fill #0

## 2023-11-07 MED ORDER — IBUPROFEN 800 MG PO TABS
800.0000 mg | ORAL_TABLET | Freq: Three times a day (TID) | ORAL | 3 refills | Status: AC | PRN
  Filled 2023-11-07: qty 270, 90d supply, fill #0

## 2023-11-10 ENCOUNTER — Other Ambulatory Visit (HOSPITAL_COMMUNITY): Payer: Self-pay

## 2023-11-10 ENCOUNTER — Other Ambulatory Visit: Payer: Self-pay

## 2023-11-10 MED ORDER — XTAMPZA ER 27 MG PO C12A
1.0000 | EXTENDED_RELEASE_CAPSULE | Freq: Two times a day (BID) | ORAL | 0 refills | Status: AC
  Filled 2023-11-10: qty 60, 30d supply, fill #0

## 2023-11-10 MED ORDER — OXYCONTIN 30 MG PO T12A
30.0000 mg | EXTENDED_RELEASE_TABLET | Freq: Two times a day (BID) | ORAL | 0 refills | Status: DC
  Filled 2023-11-10: qty 60, 30d supply, fill #0

## 2023-11-10 NOTE — Progress Notes (Signed)
 Specialty Pharmacy Refill Coordination Note  Michael Mullins is a 44 y.o. male contacted today regarding refills of specialty medication(s) Adalimumab  (Humira  (2 Pen))   Patient requested Delivery   Delivery date: 11/11/23 (by 6.4.25 at latest - patient aware)   Verified address: [address]  [place]   Medication will be filled on 06.02.25.

## 2023-11-12 ENCOUNTER — Other Ambulatory Visit (HOSPITAL_COMMUNITY): Payer: Self-pay

## 2023-11-13 ENCOUNTER — Other Ambulatory Visit: Payer: Self-pay

## 2023-11-20 DIAGNOSIS — Z79899 Other long term (current) drug therapy: Secondary | ICD-10-CM | POA: Diagnosis not present

## 2023-11-20 DIAGNOSIS — Z7984 Long term (current) use of oral hypoglycemic drugs: Secondary | ICD-10-CM | POA: Diagnosis not present

## 2023-11-20 DIAGNOSIS — E785 Hyperlipidemia, unspecified: Secondary | ICD-10-CM | POA: Diagnosis not present

## 2023-11-20 DIAGNOSIS — E11622 Type 2 diabetes mellitus with other skin ulcer: Secondary | ICD-10-CM | POA: Diagnosis not present

## 2023-11-20 DIAGNOSIS — I83013 Varicose veins of right lower extremity with ulcer of ankle: Secondary | ICD-10-CM | POA: Diagnosis not present

## 2023-11-20 DIAGNOSIS — L989 Disorder of the skin and subcutaneous tissue, unspecified: Secondary | ICD-10-CM | POA: Diagnosis not present

## 2023-11-20 DIAGNOSIS — L97319 Non-pressure chronic ulcer of right ankle with unspecified severity: Secondary | ICD-10-CM | POA: Diagnosis not present

## 2023-11-20 DIAGNOSIS — D571 Sickle-cell disease without crisis: Secondary | ICD-10-CM | POA: Diagnosis not present

## 2023-11-20 DIAGNOSIS — I87311 Chronic venous hypertension (idiopathic) with ulcer of right lower extremity: Secondary | ICD-10-CM | POA: Diagnosis not present

## 2023-11-20 DIAGNOSIS — Z8614 Personal history of Methicillin resistant Staphylococcus aureus infection: Secondary | ICD-10-CM | POA: Diagnosis not present

## 2023-11-21 ENCOUNTER — Other Ambulatory Visit (HOSPITAL_COMMUNITY): Payer: Self-pay

## 2023-11-21 MED ORDER — FREESTYLE LIBRE 3 PLUS SENSOR MISC
6 refills | Status: AC
  Filled 2023-11-21: qty 2, 30d supply, fill #0
  Filled 2024-02-04: qty 2, 30d supply, fill #1
  Filled 2024-03-25 (×2): qty 2, 30d supply, fill #2
  Filled 2024-05-18: qty 2, 30d supply, fill #3

## 2023-11-21 MED ORDER — JARDIANCE 10 MG PO TABS
10.0000 mg | ORAL_TABLET | Freq: Every day | ORAL | 3 refills | Status: AC
  Filled 2023-11-21: qty 90, 90d supply, fill #0
  Filled 2024-04-15: qty 90, 90d supply, fill #1
  Filled 2024-04-15: qty 90, 90d supply, fill #0

## 2023-11-23 ENCOUNTER — Other Ambulatory Visit: Payer: Self-pay

## 2023-11-23 ENCOUNTER — Emergency Department (HOSPITAL_COMMUNITY)
Admission: EM | Admit: 2023-11-23 | Discharge: 2023-11-23 | Disposition: A | Discharge status: Home / Self Care | Attending: Emergency Medicine | Admitting: Emergency Medicine

## 2023-11-23 ENCOUNTER — Encounter (HOSPITAL_COMMUNITY): Payer: Self-pay

## 2023-11-23 DIAGNOSIS — D5700 Hb-SS disease with crisis, unspecified: Secondary | ICD-10-CM | POA: Diagnosis not present

## 2023-11-23 DIAGNOSIS — E119 Type 2 diabetes mellitus without complications: Secondary | ICD-10-CM | POA: Diagnosis not present

## 2023-11-23 LAB — COMPREHENSIVE METABOLIC PANEL WITH GFR
ALT: 22 U/L (ref 0–44)
AST: 40 U/L (ref 15–41)
Albumin: 4.2 g/dL (ref 3.5–5.0)
Alkaline Phosphatase: 52 U/L (ref 38–126)
Anion gap: 9 (ref 5–15)
BUN: 13 mg/dL (ref 6–20)
CO2: 23 mmol/L (ref 22–32)
Calcium: 9.5 mg/dL (ref 8.9–10.3)
Chloride: 106 mmol/L (ref 98–111)
Creatinine, Ser: 0.56 mg/dL — ABNORMAL LOW (ref 0.61–1.24)
GFR, Estimated: >60 mL/min (ref >60)
Glucose, Bld: 111 mg/dL — ABNORMAL HIGH (ref 70–99)
Potassium: 4 mmol/L (ref 3.5–5.1)
Sodium: 138 mmol/L (ref 135–145)
Total Bilirubin: 5.4 mg/dL — ABNORMAL HIGH (ref 0.0–1.2)
Total Protein: 7.7 g/dL (ref 6.5–8.1)

## 2023-11-23 LAB — CBC WITH DIFFERENTIAL/PLATELET
Abs Immature Granulocytes: 0.07 10*3/uL (ref 0.00–0.07)
Basophils Absolute: 0.1 10*3/uL (ref 0.0–0.1)
Basophils Relative: 0 %
Eosinophils Absolute: 0.2 10*3/uL (ref 0.0–0.5)
Eosinophils Relative: 2 %
HCT: 26.1 % — ABNORMAL LOW (ref 39.0–52.0)
Hemoglobin: 9.5 g/dL — ABNORMAL LOW (ref 13.0–17.0)
Immature Granulocytes: 1 %
Lymphocytes Relative: 27 %
Lymphs Abs: 3.1 10*3/uL (ref 0.7–4.0)
MCH: 32.1 pg (ref 26.0–34.0)
MCHC: 36.4 g/dL — ABNORMAL HIGH (ref 30.0–36.0)
MCV: 88.2 fL (ref 80.0–100.0)
Monocytes Absolute: 2.2 10*3/uL — ABNORMAL HIGH (ref 0.1–1.0)
Monocytes Relative: 19 %
Neutro Abs: 5.8 10*3/uL (ref 1.7–7.7)
Neutrophils Relative %: 51 %
Platelets: 306 10*3/uL (ref 150–400)
RBC: 2.96 MIL/uL — ABNORMAL LOW (ref 4.22–5.81)
RDW: 23.7 % — ABNORMAL HIGH (ref 11.5–15.5)
WBC: 11.4 10*3/uL — ABNORMAL HIGH (ref 4.0–10.5)
nRBC: 4.1 % — ABNORMAL HIGH (ref 0.0–0.2)

## 2023-11-23 LAB — RETICULOCYTES
Immature Retic Fract: 35.3 % — ABNORMAL HIGH (ref 2.3–15.9)
RBC.: 3.1 MIL/uL — ABNORMAL LOW (ref 4.22–5.81)
Retic Count, Absolute: 523.5 10*3/uL — ABNORMAL HIGH (ref 19.0–186.0)
Retic Ct Pct: 16.9 % — ABNORMAL HIGH (ref 0.4–3.1)

## 2023-11-23 MED ORDER — HYDROMORPHONE HCL 1 MG/ML IJ SOLN
1.0000 mg | Freq: Once | INTRAMUSCULAR | Status: AC
  Administered 2023-11-23: 1 mg via INTRAVENOUS
  Filled 2023-11-23: qty 1

## 2023-11-23 MED ORDER — ONDANSETRON HCL 4 MG/2ML IJ SOLN
4.0000 mg | Freq: Once | INTRAMUSCULAR | Status: AC
  Administered 2023-11-23: 4 mg via INTRAVENOUS
  Filled 2023-11-23: qty 2

## 2023-11-23 MED ORDER — DIPHENHYDRAMINE HCL 50 MG/ML IJ SOLN
25.0000 mg | Freq: Once | INTRAMUSCULAR | Status: AC
Start: 2023-11-23 — End: 2023-11-23
  Administered 2023-11-23: 25 mg via INTRAVENOUS
  Filled 2023-11-23: qty 1

## 2023-11-23 MED ORDER — HYDROMORPHONE HCL 1 MG/ML IJ SOLN: 1.0000 mg | Freq: Once | INTRAMUSCULAR | Status: DC

## 2023-11-23 MED ORDER — DIPHENHYDRAMINE HCL 50 MG/ML IJ SOLN
25.0000 mg | Freq: Once | INTRAMUSCULAR | Status: AC
  Administered 2023-11-23: 25 mg via INTRAVENOUS
  Filled 2023-11-23: qty 1

## 2023-11-23 MED ORDER — SODIUM CHLORIDE 0.9 % IV SOLN: Freq: Once | INTRAVENOUS | Status: AC

## 2023-11-23 NOTE — ED Triage Notes (Signed)
 Pt c/o sickle cell crisis pain that has become uncontrolled since last night. Pt has right leg ulcer that is wrapped. Pt took oxycodone  around 0830.

## 2023-11-23 NOTE — Discharge Instructions (Signed)
 You had improvement in your pain after a few rounds of medicine.  I did offer and discuss admission with you however you state you are feeling better and would like to go home and try your home medicines.  If you have any worsening symptoms return to the emergency room.  Follow-up with your primary care doctor.  Return for any concerning symptoms.

## 2023-11-23 NOTE — ED Notes (Signed)
 Attempted to get patient an IV. US  IV to be attempted. Asked patient if he wanted the belly shot of pain medication or if he wanted to wait to get an IV and get IV pain medication. Pt states he will wait to get the US  IV and receive IV pain medication.

## 2023-11-23 NOTE — ED Provider Notes (Signed)
 [place] EMERGENCY DEPARTMENT AT [facility] Provider Note   CSN: [id number] Arrival date & time: 11/23/23  1037     Patient presents with: Sickle Cell Pain Crisis   Michael Mullins is a 43 y.o. male.   43 year old male with past medical history of sickle cell presents today for concern of sickle cell pain crisis.  He states the crisis has been going on for the past couple weeks however has significantly worsened since last night.  He reports pain in his right lower extremity shooting proximally from his right ankle.  He states this is normal for his pain crisis.  He also has a wound to his right shin which has been present for couple weeks.  He states he is susceptible to these given his sickle cell disease.  He does follow with his primary care doctor that is managing this.  Recently had this stressed on Thursday.  He has been taking his oxycodone  at home without any significant relief.  Wife and kids are at bedside.  The history is provided by the patient. No language interpreter was used.       Prior to Admission medications   Medication Sig Start Date End Date Taking? Authorizing Provider  adalimumab  (HUMIRA , 2 PEN,) 40 MG/0.8ML AJKT pen Inject 0.8 mL (40 mg total) under the skin every 14 (fourteen) days. 09/10/23   Jegede, Olugbemiga E, MD  Clindamycin -Benzoyl Per, Refr, gel Apply topically daily to face 11/20/22     clobetasol  (TEMOVATE ) 0.05 % external solution Apply 1 Application topically daily to area on scalp. Patient taking differently: Apply 1 Application topically daily. When compliant 02/13/22     clobetasol  (TEMOVATE ) 0.05 % external solution Apply 1 Application topically daily to area on scalp 11/20/22     Continuous Glucose Sensor (FREESTYLE LIBRE 3 PLUS SENSOR) MISC Apply one sensor to the back of the arm every 15 days for continuous glucose monitoring. Follow package directions for replacement. 11/21/23     empagliflozin  (JARDIANCE ) 10 MG TABS tablet Take 1  tablet (10 mg total) by mouth daily. 12/30/22     empagliflozin  (JARDIANCE ) 10 MG TABS tablet Take 1 tablet (10 mg total) by mouth daily. 11/21/23     ergocalciferol  (VITAMIN D2) 1.25 MG (50000 UT) capsule Take 1 capsule (50,000 Units total) by mouth every 30 (thirty) days. 12/06/21     ergocalciferol  (VITAMIN D2) 1.25 MG (50000 UT) capsule Take 1 capsule (50,000 Units total) by mouth every 30 (thirty) days. 03/25/22     ferrous sulfate  (FEROSUL) 325 (65 FE) MG tablet Take by mouth. Patient not taking: Reported on 08/27/2022 08/07/20     folic acid  (FOLVITE ) 1 MG tablet Take 1 tablet (1 mg total) by mouth daily. 11/28/21     folic acid  (FOLVITE ) 1 MG tablet Take 1 tablet (1 mg total) by mouth daily. 07/09/22     folic acid  (FOLVITE ) 1 MG tablet Take 1 tablet (1 mg total) by mouth daily. 08/29/22   Sigurd Driver, FNP  glucose blood (PRECISION QID TEST) test strip Use 1 strip to check blood sugar daily. 03/22/22     GNP Alcohol  Swabs  70 % PADS Use 1 swab as needed to clean site for blood sugar check. 03/22/22     ibuprofen  (ADVIL ) 800 MG tablet Take 1 tablet (800 mg total) by mouth every 8 (eight) hours as needed for Pain or Fever. Patient taking differently: Take 800 mg by mouth as needed for moderate pain. 10/08/21  ibuprofen  (ADVIL ) 800 MG tablet Take 1 tablet (800 mg total) by mouth every 8 (eight) hours as needed for fever 100.4 F or GREATER (pain). 11/07/23     Lancets (FREESTYLE) lancets Use 1 lancet to lance skin to check blood sugar once daily. 03/22/22     lisinopril  (ZESTRIL ) 2.5 MG tablet Take 1 tablet (2.5 mg total) by mouth daily. 12/30/22     meclizine  (ANTIVERT ) 25 MG tablet Take 1 tablet (25 mg total) by mouth 3 (three)  times daily as needed. Patient taking differently: Take 25 mg by mouth as needed for dizziness or nausea. 10/11/21     naloxone  (NARCAN ) nasal spray 4 mg/0.1 mL Use as directed every 2-3 minutes until emergency arrives, call 911 Patient not taking: Reported on 08/27/2022  08/07/20     naloxone  (NARCAN ) nasal spray 4 mg/0.1 mL Place 1 spray into one nostril as needed. (for excessive sedation) 01/15/23     ondansetron  (ZOFRAN -ODT) 4 MG disintegrating tablet Take 1 tablet (4 mg total) by mouth every 8 (eight) hours as needed for nausea or vomiting. 01/13/23   Rosealee Concha, MD  OXBRYTA  500 MG TABS tablet Take 1,500 mg by mouth daily. 01/01/21   [provider]  oxyCODONE  (ROXICODONE ) 15 MG immediate release tablet Take 1 - 2 tablets every 8 hours as needed for break through pain based on pain levels. (30 day supply) 01/15/23     oxyCODONE  (ROXICODONE ) 15 MG immediate release tablet Take 1-2 tablets (15-30 mg total) by mouth every 8 (eight) hours as needed for break through pain based on pain levels. 02/28/23     oxyCODONE  (ROXICODONE ) 15 MG immediate release tablet Take 1-2 tablets (15-30 mg total) by mouth every 8 (eight) hours as needed for break through pain based on pain levels. 04/03/23     oxyCODONE  (ROXICODONE ) 15 MG immediate release tablet Take 1-2 tablets (15-30 mg total) by mouth every 8 (eight) hours as needed for breakthrough pain based on pain levels. *Must last 30 days per MD* 08/01/23     oxyCODONE  (ROXICODONE ) 15 MG immediate release tablet Take 1-2 tablets (15-30 mg total) by mouth every 8 (eight) hours as needed for break through pain based on pain levels. 09/29/23     oxyCODONE  (ROXICODONE ) 15 MG immediate release tablet Take 1 tablet (15 mg total) by mouth every 8 (eight) hours as needed for pain. Take 1-2 tablets (15-30 mg total) every 8 hours as needed for break throught pain based on pain level. 10/29/23     oxyCODONE  ER (XTAMPZA  ER) 27 MG C12A Take 1 capsule by mouth 2 (two) times daily. 11/10/23     rosuvastatin  (CRESTOR ) 5 MG tablet Take 1 tablet (5 mg total) by mouth daily for cholesterol 10/01/22     triamcinolone  cream (KENALOG ) 0.1 % Apply to affected skin of arms twice daily as needed. NEVER to face/neck/groin. Patient not taking: Reported on  08/27/2022 11/28/20     Vitamin D , Ergocalciferol , (DRISDOL ) 1.25 MG (50000 UNIT) CAPS capsule Take 1 capsule (50,000 Units total) by mouth every 30 (thirty) days. 09/09/22       Allergies: Morphine and Vancomycin     Review of Systems  Constitutional:  Negative for chills and fever.  Respiratory:  Negative for shortness of breath.   Cardiovascular:  Negative for chest pain.  Musculoskeletal:  Positive for arthralgias and myalgias.  Neurological:  Negative for light-headedness.  All other systems reviewed and are negative.   Updated Vital Signs BP (!) 129/94 (BP Location: Left  Arm)   Pulse 80   Temp 98.5 F (36.9 C) (Oral)   Resp 16   Ht 5' 10 (1.778 m)   Wt 104.3 kg   SpO2 92%   BMI 33.00 kg/m   Physical Exam Vitals and nursing note reviewed.  Constitutional:      General: He is not in acute distress.    Appearance: Normal appearance. He is not ill-appearing.  HENT:     Head: Normocephalic and atraumatic.     Nose: Nose normal.   Eyes:     Conjunctiva/sclera: Conjunctivae normal.    Cardiovascular:     Rate and Rhythm: Normal rate and regular rhythm.  Pulmonary:     Effort: Pulmonary effort is normal. No respiratory distress.   Musculoskeletal:        General: No deformity. Normal range of motion.   Skin:    Findings: No rash.   Neurological:     Mental Status: He is alert.     (all labs ordered are listed, but only abnormal results are displayed) Labs Reviewed  COMPREHENSIVE METABOLIC PANEL WITH GFR  CBC WITH DIFFERENTIAL/PLATELET  RETICULOCYTES    EKG: None  Radiology: No results found.   Procedures   Medications Ordered in the ED  HYDROmorphone  (DILAUDID ) injection 1 mg (has no administration in time range)  0.9 %  sodium chloride  infusion (has no administration in time range)  diphenhydrAMINE  (BENADRYL ) injection 25 mg (has no administration in time range)  ondansetron  (ZOFRAN ) injection 4 mg (has no administration in time range)     Clinical Course as of 11/23/23 1859  Sun Nov 23, 2023  1415 Reports improvement in pain though he states is coming back and would like an additional dose of pain medicine.  Requested to remove the dressing to visualize the wound to right lower extremity if however he refuses to let me look at it stating that it is closely monitored by wound care and recently had of this dressed on Thursday.  I did discuss her concern for potential infection that may be making the pain worse however he still defers. [AA]  1511 Patient has reported improvement in pain however he states he is worried about going home due to recurrence of pain.  Would like to be observed for a bit longer. [AA]    Clinical Course User Index [AA] Lucina Sabal, PA-C                                 Medical Decision Making Amount and/or Complexity of Data Reviewed Labs: ordered.  Risk Prescription drug management.   Medical Decision Making / ED Course   This patient presents to the ED for concern of sickle cell pain crisis, this involves an extensive number of treatment options, and is a complaint that carries with it a high risk of complications and morbidity.  The differential diagnosis includes sickle cell pain crisis, cellulitis,  MDM: 43 year old male with past medical history significant for sickle cell disease presents today for concern of sickle cell pain crisis.  Ongoing for the past couple weeks but uncontrollable over the past day or so. States this is typical for sickle cell pain crisis.  He is without chest pain, shortness of breath. He also has a wound to his right lower extremity that he does not want to unwrap and show me because he has been following up with wound clinic weekly and has distressed by them.  I did discuss the concern for potential infection that is contributing to the pain however he he declines to have this undressed and evaluated today. Patient was given multiple rounds of medicines in the  emergency department with improvement in symptoms.  Given he required 3 doses we did discuss admission however he states that he feels comfortable to go home and try his oral pain medicine.  He will return for any worsening symptoms. CBC shows mild leukocytosis, no significant anemia.  CMP without acute concern.  Reticulocyte count elevated which is appropriate for pain crisis.  Discharged in stable condition.  Both patient and wife voiced understanding and are in agreement with plan.   Additional history obtained: -Additional history obtained from wife who is at bedside -External records from outside source obtained and reviewed including: Chart review including previous notes, labs, imaging, consultation notes   Lab Tests: -I ordered, reviewed, and interpreted labs.   The pertinent results include:   Labs Reviewed  COMPREHENSIVE METABOLIC PANEL WITH GFR - Abnormal; Notable for the following components:      Result Value   Glucose, Bld 111 (*)    Creatinine, Ser 0.56 (*)    Total Bilirubin 5.4 (*)    All other components within normal limits  CBC WITH DIFFERENTIAL/PLATELET - Abnormal; Notable for the following components:   WBC 11.4 (*)    RBC 2.96 (*)    Hemoglobin 9.5 (*)    HCT 26.1 (*)    MCHC 36.4 (*)    RDW 23.7 (*)    nRBC 4.1 (*)    Monocytes Absolute 2.2 (*)    All other components within normal limits  RETICULOCYTES - Abnormal; Notable for the following components:   Retic Ct Pct 16.9 (*)    RBC. 3.10 (*)    Retic Count, Absolute 523.5 (*)    Immature Retic Fract 35.3 (*)    All other components within normal limits      EKG  EKG Interpretation Date/Time:    Ventricular Rate:    PR Interval:    QRS Duration:    QT Interval:    QTC Calculation:   R Axis:      Text Interpretation:            Medicines ordered and prescription drug management: Meds ordered this encounter  Medications   DISCONTD: HYDROmorphone  (DILAUDID ) injection 1 mg   0.9 %   sodium chloride  infusion   diphenhydrAMINE  (BENADRYL ) injection 25 mg   ondansetron  (ZOFRAN ) injection 4 mg   HYDROmorphone  (DILAUDID ) injection 1 mg   HYDROmorphone  (DILAUDID ) injection 1 mg   ondansetron  (ZOFRAN ) injection 4 mg   HYDROmorphone  (DILAUDID ) injection 1 mg   diphenhydrAMINE  (BENADRYL ) injection 25 mg    -I have reviewed the patients home medicines and have made adjustments as needed     Reevaluation: After the interventions noted above, I reevaluated the patient and found that they have :improved  Co morbidities that complicate the patient evaluation  Past Medical History:  Diagnosis Date   Diabetes mellitus without complication (HCC)    Sickle cell anemia (HCC)    Ulcer, venous stasis (HCC) 2025   june      Dispostion: Discharged in stable condition.  Return precaution discussed.  Patient voices understanding and is in agreement with plan.   Final diagnoses:  Sickle cell pain crisis [facility])    ED Discharge Orders     None          Lucina Sabal, PA-C 11/23/23  Alphonso Aschoff    Early Glisson, MD 11/24/23 [phone]

## 2023-11-26 ENCOUNTER — Other Ambulatory Visit (HOSPITAL_COMMUNITY): Payer: Self-pay

## 2023-11-26 DIAGNOSIS — D571 Sickle-cell disease without crisis: Secondary | ICD-10-CM | POA: Diagnosis not present

## 2023-11-26 MED ORDER — GABAPENTIN 300 MG PO CAPS
300.0000 mg | ORAL_CAPSULE | Freq: Every day | ORAL | 0 refills | Status: AC
  Filled 2023-11-26: qty 90, 90d supply, fill #0

## 2023-11-27 DIAGNOSIS — Z8614 Personal history of Methicillin resistant Staphylococcus aureus infection: Secondary | ICD-10-CM | POA: Diagnosis not present

## 2023-11-27 DIAGNOSIS — E11622 Type 2 diabetes mellitus with other skin ulcer: Secondary | ICD-10-CM | POA: Diagnosis not present

## 2023-11-27 DIAGNOSIS — Z79899 Other long term (current) drug therapy: Secondary | ICD-10-CM | POA: Diagnosis not present

## 2023-11-27 DIAGNOSIS — D571 Sickle-cell disease without crisis: Secondary | ICD-10-CM | POA: Diagnosis not present

## 2023-11-27 DIAGNOSIS — I87311 Chronic venous hypertension (idiopathic) with ulcer of right lower extremity: Secondary | ICD-10-CM | POA: Diagnosis not present

## 2023-11-27 DIAGNOSIS — L989 Disorder of the skin and subcutaneous tissue, unspecified: Secondary | ICD-10-CM | POA: Diagnosis not present

## 2023-11-27 DIAGNOSIS — L97319 Non-pressure chronic ulcer of right ankle with unspecified severity: Secondary | ICD-10-CM | POA: Diagnosis not present

## 2023-11-27 DIAGNOSIS — Z7984 Long term (current) use of oral hypoglycemic drugs: Secondary | ICD-10-CM | POA: Diagnosis not present

## 2023-11-27 DIAGNOSIS — I83013 Varicose veins of right lower extremity with ulcer of ankle: Secondary | ICD-10-CM | POA: Diagnosis not present

## 2023-11-27 DIAGNOSIS — E785 Hyperlipidemia, unspecified: Secondary | ICD-10-CM | POA: Diagnosis not present

## 2023-12-01 ENCOUNTER — Other Ambulatory Visit: Payer: Self-pay

## 2023-12-01 ENCOUNTER — Encounter (HOSPITAL_COMMUNITY): Payer: Self-pay

## 2023-12-01 ENCOUNTER — Emergency Department (HOSPITAL_COMMUNITY)

## 2023-12-01 ENCOUNTER — Inpatient Hospital Stay (HOSPITAL_COMMUNITY)
Admission: EM | Admit: 2023-12-01 | Discharge: 2023-12-13 | DRG: 812 | Disposition: A | Discharge status: Home / Self Care | Attending: Internal Medicine | Admitting: Internal Medicine

## 2023-12-01 DIAGNOSIS — E1165 Type 2 diabetes mellitus with hyperglycemia: Secondary | ICD-10-CM | POA: Diagnosis present

## 2023-12-01 DIAGNOSIS — J9811 Atelectasis: Secondary | ICD-10-CM | POA: Diagnosis not present

## 2023-12-01 DIAGNOSIS — R918 Other nonspecific abnormal finding of lung field: Secondary | ICD-10-CM | POA: Diagnosis not present

## 2023-12-01 DIAGNOSIS — R739 Hyperglycemia, unspecified: Secondary | ICD-10-CM

## 2023-12-01 DIAGNOSIS — Z79620 Long term (current) use of immunosuppressive biologic: Secondary | ICD-10-CM

## 2023-12-01 DIAGNOSIS — R7401 Elevation of levels of liver transaminase levels: Secondary | ICD-10-CM

## 2023-12-01 DIAGNOSIS — G8929 Other chronic pain: Secondary | ICD-10-CM

## 2023-12-01 DIAGNOSIS — I10 Essential (primary) hypertension: Secondary | ICD-10-CM | POA: Diagnosis not present

## 2023-12-01 DIAGNOSIS — Z9049 Acquired absence of other specified parts of digestive tract: Secondary | ICD-10-CM

## 2023-12-01 DIAGNOSIS — D5700 Hb-SS disease with crisis, unspecified: Secondary | ICD-10-CM | POA: Diagnosis not present

## 2023-12-01 DIAGNOSIS — Z885 Allergy status to narcotic agent status: Secondary | ICD-10-CM

## 2023-12-01 DIAGNOSIS — D638 Anemia in other chronic diseases classified elsewhere: Secondary | ICD-10-CM | POA: Diagnosis present

## 2023-12-01 DIAGNOSIS — Z79899 Other long term (current) drug therapy: Secondary | ICD-10-CM

## 2023-12-01 DIAGNOSIS — Z881 Allergy status to other antibiotic agents status: Secondary | ICD-10-CM

## 2023-12-01 DIAGNOSIS — D72829 Elevated white blood cell count, unspecified: Secondary | ICD-10-CM | POA: Diagnosis present

## 2023-12-01 DIAGNOSIS — Z7984 Long term (current) use of oral hypoglycemic drugs: Secondary | ICD-10-CM

## 2023-12-01 DIAGNOSIS — I878 Other specified disorders of veins: Secondary | ICD-10-CM | POA: Diagnosis present

## 2023-12-01 DIAGNOSIS — L97919 Non-pressure chronic ulcer of unspecified part of right lower leg with unspecified severity: Secondary | ICD-10-CM | POA: Diagnosis present

## 2023-12-01 DIAGNOSIS — E669 Obesity, unspecified: Secondary | ICD-10-CM | POA: Diagnosis present

## 2023-12-01 DIAGNOSIS — G894 Chronic pain syndrome: Secondary | ICD-10-CM | POA: Diagnosis present

## 2023-12-01 DIAGNOSIS — Z6833 Body mass index (BMI) 33.0-33.9, adult: Secondary | ICD-10-CM

## 2023-12-01 MED ORDER — KETOROLAC TROMETHAMINE 15 MG/ML IJ SOLN
15.0000 mg | INTRAMUSCULAR | Status: AC
  Administered 2023-12-02: 15 mg via INTRAVENOUS
  Filled 2023-12-01: qty 1

## 2023-12-01 MED ORDER — HYDROMORPHONE HCL 1 MG/ML IJ SOLN
2.0000 mg | INTRAMUSCULAR | Status: AC
  Administered 2023-12-01: 2 mg via INTRAVENOUS
  Filled 2023-12-01: qty 2

## 2023-12-01 MED ORDER — HYDROMORPHONE HCL 1 MG/ML IJ SOLN
2.0000 mg | INTRAMUSCULAR | Status: AC
  Administered 2023-12-02: 2 mg via INTRAVENOUS
  Filled 2023-12-01: qty 2

## 2023-12-01 MED ORDER — SODIUM CHLORIDE 0.45 % IV SOLN: INTRAVENOUS | Status: DC

## 2023-12-01 MED ORDER — HYDROMORPHONE HCL 1 MG/ML IJ SOLN
2.0000 mg | INTRAMUSCULAR | Status: DC
  Filled 2023-12-01: qty 2

## 2023-12-01 MED ORDER — DIPHENHYDRAMINE HCL 50 MG/ML IJ SOLN
25.0000 mg | Freq: Once | INTRAMUSCULAR | Status: AC
  Administered 2023-12-02: 25 mg via INTRAVENOUS
  Filled 2023-12-01: qty 1

## 2023-12-01 NOTE — ED Notes (Signed)
 Patient's wife asking for ice chips at this time. RN Roselie made aware.

## 2023-12-01 NOTE — ED Triage Notes (Signed)
 Pt reports sickle cell crisis, and right leg ulcer that is wrapped, last took oxycodone  about 1 1/2 hours ago, pain still present.

## 2023-12-01 NOTE — ED Provider Notes (Signed)
 [place] EMERGENCY DEPARTMENT AT [facility] Provider Note   CSN: [id number] Arrival date & time: 12/01/23  2246     Patient presents with: Sickle Cell Pain Crisis   Michael Mullins is a 43 y.o. male.  {Add pertinent medical, surgical, social history, OB history to YEP:67052} The history is provided by the patient.  Sickle Cell Pain Crisis He has a history of diabetes, sickle cell disease and comes in because of sickle cell pain which started this morning.  His pain is in his left arm, left leg, and across his upper abdomen.  He denies fever or chills.  He denies cough.  He denies nausea or vomiting.  He does have a chronic venous stasis ulcer on his right foot, but no other ongoing infection.  He has been taking oxycodone  for pain without relief.   Prior to Admission medications   Medication Sig Start Date End Date Taking? Authorizing Provider  adalimumab  (HUMIRA , 2 PEN,) 40 MG/0.8ML AJKT pen Inject 0.8 mL (40 mg total) under the skin every 14 (fourteen) days. 09/10/23   Jegede, Olugbemiga E, MD  Continuous Glucose Sensor (FREESTYLE LIBRE 3 PLUS SENSOR) MISC Apply one sensor to the back of the arm every 15 days for continuous glucose monitoring. Follow package directions for replacement. 11/21/23     empagliflozin  (JARDIANCE ) 10 MG TABS tablet Take 1 tablet (10 mg total) by mouth daily. 11/21/23     folic acid  (FOLVITE ) 1 MG tablet Take 1 tablet (1 mg total) by mouth daily. 08/29/22   Tilford Bertram HERO, FNP  gabapentin  (NEURONTIN ) 300 MG capsule Take 1 capsule (300 mg total) by mouth at bedtime. 11/26/23   Waddle, Sarah Elizabeth, PA-C  glucose blood (PRECISION QID TEST) test strip Use 1 strip to check blood sugar daily. 03/22/22     GNP Alcohol  Swabs  70 % PADS Use 1 swab as needed to clean site for blood sugar check. 03/22/22     ibuprofen  (ADVIL ) 800 MG tablet Take 1 tablet (800 mg total) by mouth every 8 (eight) hours as needed for fever 100.4 F or GREATER (pain). 11/07/23      Lancets (FREESTYLE) lancets Use 1 lancet to lance skin to check blood sugar once daily. 03/22/22     lisinopril  (ZESTRIL ) 2.5 MG tablet Take 1 tablet (2.5 mg total) by mouth daily. 12/30/22     naloxone  (NARCAN ) nasal spray 4 mg/0.1 mL Place 1 spray into one nostril as needed. (for excessive sedation) 01/15/23     ondansetron  (ZOFRAN -ODT) 4 MG disintegrating tablet Take 1 tablet (4 mg total) by mouth every 8 (eight) hours as needed for nausea or vomiting. 01/13/23   Jerrol Agent, MD  oxyCODONE  (ROXICODONE ) 15 MG immediate release tablet Take 1 tablet (15 mg total) by mouth every 8 (eight) hours as needed for pain. Take 1-2 tablets (15-30 mg total) every 8 hours as needed for break throught pain based on pain level. 10/29/23     oxyCODONE  ER (XTAMPZA  ER) 27 MG C12A Take 1 capsule by mouth 2 (two) times daily. 11/10/23     rosuvastatin  (CRESTOR ) 5 MG tablet Take 1 tablet (5 mg total) by mouth daily for cholesterol 10/01/22     Vitamin D , Ergocalciferol , (DRISDOL ) 1.25 MG (50000 UNIT) CAPS capsule Take 1 capsule (50,000 Units total) by mouth every 30 (thirty) days. 09/09/22       Allergies: Morphine and Vancomycin     Review of Systems  All other systems reviewed and are negative.   Updated  Vital Signs BP 137/78   Pulse (!) 118   Temp 99.4 F (37.4 C)   Resp 20   Ht 5' 10 (1.778 m)   Wt 104.3 kg   SpO2 91%   BMI 33.00 kg/m   Physical Exam Vitals and nursing note reviewed.   43 year old male, in obvious pain, but is in no acute distress. Vital signs are significant for elevated heart rate. Oxygen  saturation is 91%, which is normal. Head is normocephalic and atraumatic. PERRLA, EOMI.  Lungs are clear without rales, wheezes, or rhonchi. Chest is nontender. Heart has regular rate and rhythm without murmur. Abdomen is soft, flat, nontender. Extremities have no edema.  Stasis ulcer present on the right foot with dressing in place which is not removed. Skin is warm and dry without  rash. Neurologic: Mental status is normal, cranial nerves are intact, moves all extremities equally.  (all labs ordered are listed, but only abnormal results are displayed) Labs Reviewed  CBC WITH DIFFERENTIAL/PLATELET  RETICULOCYTES  COMPREHENSIVE METABOLIC PANEL WITH GFR    EKG: None  Radiology: No results found.  {Document cardiac monitor, telemetry assessment procedure when appropriate:32947} Procedures   Medications Ordered in the ED  ketorolac  (TORADOL ) 15 MG/ML injection 15 mg (has no administration in time range)  HYDROmorphone  (DILAUDID ) injection 2 mg (has no administration in time range)  HYDROmorphone  (DILAUDID ) injection 2 mg (has no administration in time range)  HYDROmorphone  (DILAUDID ) injection 2 mg (has no administration in time range)  diphenhydrAMINE  (BENADRYL ) injection 25 mg (has no administration in time range)  0.45 % sodium chloride  infusion (has no administration in time range)      {Click here for ABCD2, HEART and other calculators REFRESH Note before signing:1}                              Medical Decision Making  Sickle cell pain crisis.  I have ordered the sickle cell protocol.  He will get hydromorphone  for pain as well as ketorolac  and some IV fluids.  Screening labs have been ordered.  Currently, he does not meet criteria for acute chest syndrome.  I have reviewed his past records, and he is a relatively infrequent user of the emergency department and rarely needs hospital admissions.  Last ED visit was on 11/23/2023, last admission on 08/26/2022.  {Document critical care time when appropriate  Document review of labs and clinical decision tools ie CHADS2VASC2, etc  Document your independent review of radiology images and any outside records  Document your discussion with family members, caretakers and with consultants  Document social determinants of health affecting pt's care  Document your decision making why or why not admission, treatments  were needed:32947:::1}   Final diagnoses:  None    ED Discharge Orders     None

## 2023-12-02 ENCOUNTER — Other Ambulatory Visit: Payer: Self-pay

## 2023-12-02 ENCOUNTER — Inpatient Hospital Stay (HOSPITAL_COMMUNITY)

## 2023-12-02 DIAGNOSIS — R7401 Elevation of levels of liver transaminase levels: Secondary | ICD-10-CM | POA: Diagnosis present

## 2023-12-02 DIAGNOSIS — Z7984 Long term (current) use of oral hypoglycemic drugs: Secondary | ICD-10-CM | POA: Diagnosis not present

## 2023-12-02 DIAGNOSIS — R0602 Shortness of breath: Secondary | ICD-10-CM | POA: Diagnosis not present

## 2023-12-02 DIAGNOSIS — G8929 Other chronic pain: Secondary | ICD-10-CM | POA: Diagnosis not present

## 2023-12-02 DIAGNOSIS — Z79620 Long term (current) use of immunosuppressive biologic: Secondary | ICD-10-CM | POA: Diagnosis not present

## 2023-12-02 DIAGNOSIS — D72829 Elevated white blood cell count, unspecified: Secondary | ICD-10-CM | POA: Diagnosis not present

## 2023-12-02 DIAGNOSIS — D638 Anemia in other chronic diseases classified elsewhere: Secondary | ICD-10-CM | POA: Diagnosis not present

## 2023-12-02 DIAGNOSIS — Z79899 Other long term (current) drug therapy: Secondary | ICD-10-CM | POA: Diagnosis not present

## 2023-12-02 DIAGNOSIS — Z881 Allergy status to other antibiotic agents status: Secondary | ICD-10-CM | POA: Diagnosis not present

## 2023-12-02 DIAGNOSIS — J90 Pleural effusion, not elsewhere classified: Secondary | ICD-10-CM | POA: Diagnosis not present

## 2023-12-02 DIAGNOSIS — E1165 Type 2 diabetes mellitus with hyperglycemia: Secondary | ICD-10-CM | POA: Diagnosis not present

## 2023-12-02 DIAGNOSIS — D5700 Hb-SS disease with crisis, unspecified: Secondary | ICD-10-CM

## 2023-12-02 DIAGNOSIS — J9811 Atelectasis: Secondary | ICD-10-CM | POA: Diagnosis not present

## 2023-12-02 DIAGNOSIS — I10 Essential (primary) hypertension: Secondary | ICD-10-CM | POA: Diagnosis not present

## 2023-12-02 DIAGNOSIS — I878 Other specified disorders of veins: Secondary | ICD-10-CM | POA: Diagnosis present

## 2023-12-02 DIAGNOSIS — Z885 Allergy status to narcotic agent status: Secondary | ICD-10-CM | POA: Diagnosis not present

## 2023-12-02 DIAGNOSIS — Z9049 Acquired absence of other specified parts of digestive tract: Secondary | ICD-10-CM | POA: Diagnosis not present

## 2023-12-02 DIAGNOSIS — G894 Chronic pain syndrome: Secondary | ICD-10-CM | POA: Diagnosis not present

## 2023-12-02 DIAGNOSIS — Z6833 Body mass index (BMI) 33.0-33.9, adult: Secondary | ICD-10-CM | POA: Diagnosis not present

## 2023-12-02 DIAGNOSIS — R079 Chest pain, unspecified: Secondary | ICD-10-CM | POA: Diagnosis not present

## 2023-12-02 DIAGNOSIS — L97919 Non-pressure chronic ulcer of unspecified part of right lower leg with unspecified severity: Secondary | ICD-10-CM | POA: Diagnosis not present

## 2023-12-02 DIAGNOSIS — E669 Obesity, unspecified: Secondary | ICD-10-CM | POA: Diagnosis not present

## 2023-12-02 DIAGNOSIS — R609 Edema, unspecified: Secondary | ICD-10-CM | POA: Diagnosis not present

## 2023-12-02 LAB — CBC WITH DIFFERENTIAL/PLATELET
Abs Immature Granulocytes: 0.7 10*3/uL — ABNORMAL HIGH (ref 0.00–0.07)
Basophils Absolute: 0 10*3/uL (ref 0.0–0.1)
Basophils Relative: 0 %
Eosinophils Absolute: 0.2 10*3/uL (ref 0.0–0.5)
Eosinophils Relative: 1 %
HCT: 23.8 % — ABNORMAL LOW (ref 39.0–52.0)
Hemoglobin: 8.5 g/dL — ABNORMAL LOW (ref 13.0–17.0)
Lymphocytes Relative: 27 %
Lymphs Abs: 6.6 10*3/uL — ABNORMAL HIGH (ref 0.7–4.0)
MCH: 34.3 pg — ABNORMAL HIGH (ref 26.0–34.0)
MCHC: 35.7 g/dL (ref 30.0–36.0)
MCV: 96 fL (ref 80.0–100.0)
Metamyelocytes Relative: 2 %
Monocytes Absolute: 5.4 10*3/uL — ABNORMAL HIGH (ref 0.1–1.0)
Monocytes Relative: 22 %
Myelocytes: 1 %
Neutro Abs: 11.5 10*3/uL — ABNORMAL HIGH (ref 1.7–7.7)
Neutrophils Relative %: 47 %
Platelets: 215 10*3/uL (ref 150–400)
RBC: 2.48 MIL/uL — ABNORMAL LOW (ref 4.22–5.81)
RDW: 27 % — ABNORMAL HIGH (ref 11.5–15.5)
Smear Review: ADEQUATE
WBC: 24.5 10*3/uL — ABNORMAL HIGH (ref 4.0–10.5)
nRBC: 10 /100{WBCs} — ABNORMAL HIGH
nRBC: 10.7 % — ABNORMAL HIGH (ref 0.0–0.2)

## 2023-12-02 LAB — COMPREHENSIVE METABOLIC PANEL WITH GFR
ALT: 25 U/L (ref 0–44)
AST: 80 U/L — ABNORMAL HIGH (ref 15–41)
Albumin: 4.2 g/dL (ref 3.5–5.0)
Alkaline Phosphatase: 58 U/L (ref 38–126)
Anion gap: 12 (ref 5–15)
BUN: 14 mg/dL (ref 6–20)
CO2: 22 mmol/L (ref 22–32)
Calcium: 9.4 mg/dL (ref 8.9–10.3)
Chloride: 105 mmol/L (ref 98–111)
Creatinine, Ser: 0.62 mg/dL (ref 0.61–1.24)
GFR, Estimated: >60 mL/min (ref >60)
Glucose, Bld: 129 mg/dL — ABNORMAL HIGH (ref 70–99)
Potassium: 4.1 mmol/L (ref 3.5–5.1)
Sodium: 139 mmol/L (ref 135–145)
Total Bilirubin: 9.6 mg/dL — ABNORMAL HIGH (ref 0.0–1.2)
Total Protein: 7.9 g/dL (ref 6.5–8.1)

## 2023-12-02 LAB — RETICULOCYTES
Immature Retic Fract: 43.8 % — ABNORMAL HIGH (ref 2.3–15.9)
RBC.: 2.51 MIL/uL — ABNORMAL LOW (ref 4.22–5.81)
Retic Count, Absolute: 635.5 10*3/uL — ABNORMAL HIGH (ref 19.0–186.0)
Retic Ct Pct: 24.9 % — ABNORMAL HIGH (ref 0.4–3.1)

## 2023-12-02 LAB — GLUCOSE, CAPILLARY
Glucose-Capillary: 122 mg/dL — ABNORMAL HIGH (ref 70–99)
Glucose-Capillary: 126 mg/dL — ABNORMAL HIGH (ref 70–99)
Glucose-Capillary: 97 mg/dL (ref 70–99)

## 2023-12-02 LAB — PROCALCITONIN: Procalcitonin: 0.56 ng/mL

## 2023-12-02 MED ORDER — ONDANSETRON HCL 4 MG/2ML IJ SOLN: 4.0000 mg | Freq: Four times a day (QID) | INTRAMUSCULAR | Status: DC | PRN

## 2023-12-02 MED ORDER — SODIUM CHLORIDE 0.9 % IV SOLN
2.0000 g | INTRAVENOUS | Status: AC
  Administered 2023-12-02 – 2023-12-06 (×5): 2 g via INTRAVENOUS
  Filled 2023-12-02 (×4): qty 20

## 2023-12-02 MED ORDER — FOLIC ACID 1 MG PO TABS
1.0000 mg | ORAL_TABLET | Freq: Every day | ORAL | Status: DC
  Administered 2023-12-02 – 2023-12-13 (×12): 1 mg via ORAL
  Filled 2023-12-02 (×12): qty 1

## 2023-12-02 MED ORDER — OXYCODONE HCL 5 MG PO TABS
15.0000 mg | ORAL_TABLET | Freq: Three times a day (TID) | ORAL | Status: DC | PRN
  Administered 2023-12-02 – 2023-12-07 (×4): 15 mg via ORAL
  Filled 2023-12-02 (×5): qty 3

## 2023-12-02 MED ORDER — ACETAMINOPHEN 325 MG PO TABS
650.0000 mg | ORAL_TABLET | ORAL | Status: DC | PRN
  Administered 2023-12-02 – 2023-12-11 (×16): 650 mg via ORAL
  Filled 2023-12-02 (×16): qty 2

## 2023-12-02 MED ORDER — KETOROLAC TROMETHAMINE 15 MG/ML IJ SOLN
15.0000 mg | Freq: Four times a day (QID) | INTRAMUSCULAR | Status: AC
  Administered 2023-12-02 – 2023-12-06 (×20): 15 mg via INTRAVENOUS
  Filled 2023-12-02 (×20): qty 1

## 2023-12-02 MED ORDER — HYDROMORPHONE 1 MG/ML IV SOLN
INTRAVENOUS | Status: DC
  Administered 2023-12-02: 30 mg via INTRAVENOUS
  Administered 2023-12-02: 5 mg via INTRAVENOUS
  Administered 2023-12-03: 1.5 mg via INTRAVENOUS
  Administered 2023-12-03: 5 mg via INTRAVENOUS
  Administered 2023-12-03: 2 mg via INTRAVENOUS
  Administered 2023-12-03: 30 mg via INTRAVENOUS
  Administered 2023-12-04: 2.5 mg via INTRAVENOUS
  Administered 2023-12-04: 4.5 mg via INTRAVENOUS
  Administered 2023-12-04: 6 mg via INTRAVENOUS
  Administered 2023-12-04: 2.5 mg via INTRAVENOUS
  Administered 2023-12-04: 4.5 mg via INTRAVENOUS
  Administered 2023-12-04: 2 mg via INTRAVENOUS
  Administered 2023-12-04: 2.5 mg via INTRAVENOUS
  Administered 2023-12-05: 3 mg via INTRAVENOUS
  Administered 2023-12-05: 30 mg via INTRAVENOUS
  Administered 2023-12-05: 1 mg via INTRAVENOUS
  Administered 2023-12-05: 3 mg via INTRAVENOUS
  Administered 2023-12-06: 3.5 mg via INTRAVENOUS
  Administered 2023-12-06: 4 mg via INTRAVENOUS
  Administered 2023-12-06: 1 mg via INTRAVENOUS
  Administered 2023-12-07: 6 mg via INTRAVENOUS
  Administered 2023-12-07: 30 mg via INTRAVENOUS
  Administered 2023-12-08: 6.5 mg via INTRAVENOUS
  Administered 2023-12-08: 2.5 mg via INTRAVENOUS
  Administered 2023-12-08: 4.5 mg via INTRAVENOUS
  Administered 2023-12-08: 30 mg via INTRAVENOUS
  Administered 2023-12-08: 4 mg via INTRAVENOUS
  Administered 2023-12-08: 30 mg via INTRAVENOUS
  Administered 2023-12-08: 6.5 mg via INTRAVENOUS
  Administered 2023-12-09: 2 mg via INTRAVENOUS
  Administered 2023-12-09: 6 mg via INTRAVENOUS
  Administered 2023-12-09: 4.5 mg via INTRAVENOUS
  Filled 2023-12-02 (×6): qty 30

## 2023-12-02 MED ORDER — TRIMETHOBENZAMIDE HCL 100 MG/ML IM SOLN: 200.0000 mg | Freq: Four times a day (QID) | INTRAMUSCULAR | Status: DC | PRN

## 2023-12-02 MED ORDER — SODIUM CHLORIDE 0.9% FLUSH
9.0000 mL | INTRAVENOUS | Status: DC | PRN
  Administered 2023-12-02: 9 mL via INTRAVENOUS

## 2023-12-02 MED ORDER — LISINOPRIL 5 MG PO TABS
2.5000 mg | ORAL_TABLET | Freq: Every day | ORAL | Status: DC
  Administered 2023-12-02 – 2023-12-13 (×12): 2.5 mg via ORAL
  Filled 2023-12-02 (×12): qty 1

## 2023-12-02 MED ORDER — SENNOSIDES-DOCUSATE SODIUM 8.6-50 MG PO TABS
1.0000 | ORAL_TABLET | Freq: Two times a day (BID) | ORAL | Status: DC
  Administered 2023-12-02 – 2023-12-13 (×22): 1 via ORAL
  Filled 2023-12-02 (×22): qty 1

## 2023-12-02 MED ORDER — HYDROMORPHONE HCL 1 MG/ML IJ SOLN
0.5000 mg | INTRAMUSCULAR | Status: DC | PRN
  Administered 2023-12-02: 0.5 mg via INTRAVENOUS
  Administered 2023-12-03 – 2023-12-06 (×5): 1 mg via INTRAVENOUS
  Administered 2023-12-06: 0.5 mg via INTRAVENOUS
  Administered 2023-12-06 – 2023-12-07 (×6): 1 mg via INTRAVENOUS
  Filled 2023-12-02 (×13): qty 1

## 2023-12-02 MED ORDER — HYDROMORPHONE HCL 1 MG/ML IJ SOLN
2.0000 mg | Freq: Once | INTRAMUSCULAR | Status: AC
  Administered 2023-12-02: 2 mg via INTRAVENOUS
  Filled 2023-12-02: qty 2

## 2023-12-02 MED ORDER — SODIUM CHLORIDE 0.9 % IV SOLN
500.0000 mg | INTRAVENOUS | Status: DC
  Administered 2023-12-02 – 2023-12-04 (×3): 500 mg via INTRAVENOUS
  Filled 2023-12-02 (×3): qty 5

## 2023-12-02 MED ORDER — NALOXONE HCL 0.4 MG/ML IJ SOLN: 0.4000 mg | INTRAMUSCULAR | Status: DC | PRN

## 2023-12-02 MED ORDER — EMPAGLIFLOZIN 10 MG PO TABS
10.0000 mg | ORAL_TABLET | Freq: Every day | ORAL | Status: DC
  Administered 2023-12-02 – 2023-12-13 (×12): 10 mg via ORAL
  Filled 2023-12-02 (×12): qty 1

## 2023-12-02 MED ORDER — OXYCODONE HCL ER 20 MG PO T12A
20.0000 mg | EXTENDED_RELEASE_TABLET | Freq: Two times a day (BID) | ORAL | Status: DC
  Administered 2023-12-02 – 2023-12-13 (×23): 20 mg via ORAL
  Filled 2023-12-02 (×16): qty 1
  Filled 2023-12-02: qty 2
  Filled 2023-12-02 (×6): qty 1

## 2023-12-02 MED ORDER — ENOXAPARIN SODIUM 40 MG/0.4ML IJ SOSY
40.0000 mg | PREFILLED_SYRINGE | INTRAMUSCULAR | Status: DC
  Administered 2023-12-02 – 2023-12-13 (×12): 40 mg via SUBCUTANEOUS
  Filled 2023-12-02 (×12): qty 0.4

## 2023-12-02 MED ORDER — DIPHENHYDRAMINE HCL 25 MG PO CAPS
25.0000 mg | ORAL_CAPSULE | ORAL | Status: DC | PRN
  Administered 2023-12-02 – 2023-12-07 (×5): 25 mg via ORAL
  Filled 2023-12-02 (×5): qty 1

## 2023-12-02 MED ORDER — GABAPENTIN 300 MG PO CAPS
300.0000 mg | ORAL_CAPSULE | Freq: Every day | ORAL | Status: DC
  Administered 2023-12-02 – 2023-12-12 (×11): 300 mg via ORAL
  Filled 2023-12-02 (×11): qty 1

## 2023-12-02 MED ORDER — POLYETHYLENE GLYCOL 3350 17 G PO PACK: 17.0000 g | PACK | Freq: Every day | ORAL | Status: DC | PRN

## 2023-12-02 MED ORDER — HYDROMORPHONE HCL 1 MG/ML IJ SOLN
1.0000 mg | Freq: Once | INTRAMUSCULAR | Status: AC
  Administered 2023-12-02: 1 mg via INTRAVENOUS
  Filled 2023-12-02: qty 1

## 2023-12-02 MED ORDER — HYDROMORPHONE HCL 1 MG/ML IJ SOLN
2.0000 mg | INTRAMUSCULAR | Status: AC
  Administered 2023-12-02: 2 mg via INTRAVENOUS
  Filled 2023-12-02: qty 2

## 2023-12-02 MED ORDER — SODIUM CHLORIDE 0.45 % IV SOLN: INTRAVENOUS | Status: AC

## 2023-12-02 NOTE — ED Notes (Addendum)
 Patient called out in pain. Reports pain woke him out of his sleep and is 10/10. Patient in obvious discomfort. EDP notified

## 2023-12-02 NOTE — ED Notes (Signed)
[facility] at bedside. Kaydyn Sayas Brevin Mcfadden RN 

## 2023-12-02 NOTE — ED Notes (Signed)
 [facility] ETA is 1 hr 20 min. Kellogg RN

## 2023-12-02 NOTE — ED Notes (Signed)
 Pt sleeping at this time- plan per DR Raford is to allow pt to sleep, when pt wakes up if pain is better to discharge him and if not to admit pt- informed pt spouse of this- pt spouse going home to rest- will call when pt awakes and reassess.

## 2023-12-02 NOTE — Progress Notes (Signed)
 Spoke with pt and wife in concern for wound on right leg, pt states he goes to [facility] weekly to the hypobaric chamber process for wound care. He was instructed that it os only to be checked by [facility]. Pt states he goes every Friday to receive dressing changes. Pt has a black stocking on right leg and appears that the wound care center wrapped outer dressing in an ace wrap pressure dressing no drainage note and pt able to move legs and toes has positive CMS. No dressing care orders at this time and will defer dressing changes to pt and wife and monitor and care as needed. SRP, RN

## 2023-12-02 NOTE — ED Notes (Signed)
 Pt sleeping at this time, RN told pt spouse that I will hold pain meds while pt is sleeping, if pt starts having pain to let me know and I will administer

## 2023-12-02 NOTE — Plan of Care (Signed)

## 2023-12-02 NOTE — TOC CM/SW Note (Signed)
 Transition of Care [facility]) - Inpatient Brief Assessment   Patient Details  Name: Michael Mullins MRN: [id number] Date of Birth: 1981/02/15  Transition of Care [facility]) CM/SW Contact:    Noreen KATHEE Pinal, LCSWA Phone Number: 12/02/2023, 9:20 AM   Clinical Narrative:  Transition of Care Department [facility]) has reviewed patient and no TOC needs have been identified at this time. We will continue to monitor patient advancement through interdisciplinary progression rounds. If new patient transition needs arise, please place a TOC consult.   Transition of Care Asessment: Insurance and Status: Insurance coverage has been reviewed Patient has primary care physician: Yes Home environment has been reviewed: Single Family Home with spouse Prior level of function:: Independent Prior/Current Home Services: No current home services Social Drivers of Health Review: SDOH reviewed no interventions necessary Readmission risk has been reviewed: Yes Transition of care needs: no transition of care needs at this time

## 2023-12-02 NOTE — Progress Notes (Signed)
   12/02/23 1825  Assess: MEWS Score  Temp (!) 101.3 F (38.5 C)  BP (!) 140/78  MAP (mmHg) 93  Pulse Rate (!) 121  Resp 18  SpO2 91 %  O2 Device Nasal Cannula  Assess: MEWS Score  MEWS Temp 1  MEWS Systolic 0  MEWS Pulse 2  MEWS RR 0  MEWS LOC 0  MEWS Score 3  MEWS Score Color Yellow  Assess: if the MEWS score is Yellow or Red  Were vital signs accurate and taken at a resting state? Yes  Does the patient meet 2 or more of the SIRS criteria? Yes  Does the patient have a confirmed or suspected source of infection? No  MEWS guidelines implemented  Yes, yellow  Treat  MEWS Interventions Considered administering scheduled or prn medications/treatments as ordered  Take Vital Signs  Increase Vital Sign Frequency  Yellow: Q2hr x1, continue Q4hrs until patient remains green for 12hrs  Escalate  MEWS: Escalate Yellow: Discuss with charge nurse and consider notifying provider and/or RRT  Notify: Charge Nurse/RN  Name of Charge Nurse/RN Notified Jessica, RN  Assess: SIRS CRITERIA  SIRS Temperature  1  SIRS Respirations  0  SIRS Pulse 1  SIRS WBC 0  SIRS Score Sum  2

## 2023-12-02 NOTE — H&P (Signed)
 History and Physical    Michael Mullins FMW:[id number] DOB: 04/25/1981 DOA: 12/01/2023  PCP: Van Camie Norris, MD   Patient coming from: Home   Chief Complaint: Leg, arm, back, and rib pain   HPI: Michael Mullins is a 43 y.o. male with medical history significant for sickle cell anemia and chronic pain, now presenting for evaluation of severe pain.  Patient reports that he woke yesterday morning with severe pain in his back, left arm, left leg, and right lateral ribs.  Symptoms are similar to his prior pain crises.  He is taking oxycodone  at home without relief.  Pain in the right ribs is worse if he touches it or lays on that side.  He denies shortness of breath, cough, fever, chills, abdominal pain, vomiting, diarrhea, or exertional chest pain.  ED Course: Upon arrival to the ED, patient is found to be afebrile and saturating well on 2 L/min of supplemental oxygen  with mild tachycardia and stable BP.  Labs are most notable for normal creatinine, bilirubin 9.6, WBC 24,500, and hemoglobin 8.5.  No acute findings noted on chest x-ray.  Patient was treated with Toradol , IV fluids, and IV Dilaudid  x 3 in the ED.  Review of Systems:  All other systems reviewed and apart from HPI, are negative.  Past Medical History:  Diagnosis Date   Diabetes mellitus without complication (HCC)    Sickle cell anemia (HCC)    Ulcer, venous stasis (HCC) 2025   june    Past Surgical History:  Procedure Laterality Date   CHOLECYSTECTOMY, LAPAROSCOPIC      Social History:   reports that he has never smoked. He has never used smokeless tobacco. He reports current alcohol  use. He reports that he does not currently use drugs.  Allergies  Allergen Reactions   Morphine Hives    Tolerates hydromorphone     Vancomycin  Itching    Tolerates with longer infusion times and premedcation with IV bendryl    History reviewed. No pertinent family history.   Prior to Admission medications   Medication Sig  Start Date End Date Taking? Authorizing Provider  adalimumab  (HUMIRA , 2 PEN,) 40 MG/0.8ML AJKT pen Inject 0.8 mL (40 mg total) under the skin every 14 (fourteen) days. 09/10/23   Jegede, Olugbemiga E, MD  Continuous Glucose Sensor (FREESTYLE LIBRE 3 PLUS SENSOR) MISC Apply one sensor to the back of the arm every 15 days for continuous glucose monitoring. Follow package directions for replacement. 11/21/23     empagliflozin  (JARDIANCE ) 10 MG TABS tablet Take 1 tablet (10 mg total) by mouth daily. 11/21/23     folic acid  (FOLVITE ) 1 MG tablet Take 1 tablet (1 mg total) by mouth daily. 08/29/22   Tilford Bertram HERO, FNP  gabapentin  (NEURONTIN ) 300 MG capsule Take 1 capsule (300 mg total) by mouth at bedtime. 11/26/23   Waddle, Sarah Elizabeth, PA-C  glucose blood (PRECISION QID TEST) test strip Use 1 strip to check blood sugar daily. 03/22/22     GNP Alcohol  Swabs  70 % PADS Use 1 swab as needed to clean site for blood sugar check. 03/22/22     ibuprofen  (ADVIL ) 800 MG tablet Take 1 tablet (800 mg total) by mouth every 8 (eight) hours as needed for fever 100.4 F or GREATER (pain). 11/07/23     Lancets (FREESTYLE) lancets Use 1 lancet to lance skin to check blood sugar once daily. 03/22/22     lisinopril  (ZESTRIL ) 2.5 MG tablet Take 1 tablet (2.5 mg total) by mouth  daily. 12/30/22     naloxone  (NARCAN ) nasal spray 4 mg/0.1 mL Place 1 spray into one nostril as needed. (for excessive sedation) 01/15/23     ondansetron  (ZOFRAN -ODT) 4 MG disintegrating tablet Take 1 tablet (4 mg total) by mouth every 8 (eight) hours as needed for nausea or vomiting. 01/13/23   Jerrol Agent, MD  oxyCODONE  (ROXICODONE ) 15 MG immediate release tablet Take 1 tablet (15 mg total) by mouth every 8 (eight) hours as needed for pain. Take 1-2 tablets (15-30 mg total) every 8 hours as needed for break throught pain based on pain level. 10/29/23     oxyCODONE  ER (XTAMPZA  ER) 27 MG C12A Take 1 capsule by mouth 2 (two) times daily. 11/10/23      rosuvastatin  (CRESTOR ) 5 MG tablet Take 1 tablet (5 mg total) by mouth daily for cholesterol 10/01/22     Vitamin D , Ergocalciferol , (DRISDOL ) 1.25 MG (50000 UNIT) CAPS capsule Take 1 capsule (50,000 Units total) by mouth every 30 (thirty) days. 09/09/22       Physical Exam: Vitals:   12/02/23 0230 12/02/23 0300 12/02/23 0400 12/02/23 0500  BP: 124/74 123/74 121/75 128/78  Pulse: 95 92 97 92  Resp: 16 16 15 18   Temp:   99.1 F (37.3 C)   TempSrc:   Oral   SpO2: 97% 99% 97% 98%  Weight:      Height:        Constitutional: NAD, no pallor or diaphoresis   Eyes: PERTLA, lids and conjunctivae normal ENMT: Mucous membranes are moist. Posterior pharynx clear of any exudate or lesions.   Neck: supple, no masses  Respiratory: no wheezing, no crackles. No accessory muscle use.  Cardiovascular: S1 & S2 heard, regular rate and rhythm. No JVD. Abdomen: No distension, no tenderness, soft. Bowel sounds active.  Musculoskeletal: no clubbing / cyanosis. No joint deformity upper and lower extremities.   Skin: right leg ulcer. Warm, dry, well-perfused. Neurologic: CN 2-12 grossly intact. Moving all extremities. Alert and oriented.  Psychiatric: Calm. Cooperative.    Labs and Imaging on Admission: I have personally reviewed following labs and imaging studies  CBC: Recent Labs  Lab 12/02/23 0003  WBC 24.5*  NEUTROABS 11.5*  HGB 8.5*  HCT 23.8*  MCV 96.0  PLT 215   Basic Metabolic Panel: Recent Labs  Lab 12/02/23 0003  NA 139  K 4.1  CL 105  CO2 22  GLUCOSE 129*  BUN 14  CREATININE 0.62  CALCIUM  9.4   GFR: Estimated Creatinine Clearance: 145.5 mL/min (by C-G formula based on SCr of 0.62 mg/dL). Liver Function Tests: Recent Labs  Lab 12/02/23 0003  AST 80*  ALT 25  ALKPHOS 58  BILITOT 9.6*  PROT 7.9  ALBUMIN 4.2   No results for input(s): LIPASE, AMYLASE in the last 168 hours. No results for input(s): AMMONIA in the last 168 hours. Coagulation Profile: No  results for input(s): INR, PROTIME in the last 168 hours. Cardiac Enzymes: No results for input(s): CKTOTAL, CKMB, CKMBINDEX, TROPONINI in the last 168 hours. BNP (last 3 results) No results for input(s): PROBNP in the last 8760 hours. HbA1C: No results for input(s): HGBA1C in the last 72 hours. CBG: No results for input(s): GLUCAP in the last 168 hours. Lipid Profile: No results for input(s): CHOL, HDL, LDLCALC, TRIG, CHOLHDL, LDLDIRECT in the last 72 hours. Thyroid Function Tests: No results for input(s): TSH, T4TOTAL, FREET4, T3FREE, THYROIDAB in the last 72 hours. Anemia Panel: Recent Labs    12/02/23 0003  RETICCTPCT 24.9*   Urine analysis:    Component Value Date/Time   COLORURINE AMBER (A) 08/27/2022 0330   APPEARANCEUR CLEAR 08/27/2022 0330   LABSPEC 1.015 08/27/2022 0330   PHURINE 6.0 08/27/2022 0330   GLUCOSEU NEGATIVE 08/27/2022 0330   HGBUR NEGATIVE 08/27/2022 0330   BILIRUBINUR SMALL (A) 08/27/2022 0330   KETONESUR NEGATIVE 08/27/2022 0330   PROTEINUR NEGATIVE 08/27/2022 0330   NITRITE NEGATIVE 08/27/2022 0330   LEUKOCYTESUR NEGATIVE 08/27/2022 0330   Sepsis Labs: @LABRCNTIP (procalcitonin:4,lacticidven:4) )No results found for this or any previous visit (from the past 240 hours).   Radiological Exams on Admission: DG Chest Port 1 View Result Date: 12/01/2023 CLINICAL DATA:  Sickle cell crisis EXAM: PORTABLE CHEST 1 VIEW COMPARISON:  08/26/2022 FINDINGS: Heart and mediastinal contours within normal limits. Increased markings in the lung bases are similar prior study and could reflect scarring or atelectasis. No acute confluent opacities or effusions. No acute bony abnormality. IMPRESSION: Bibasilar scarring or atelectasis. No active disease. Electronically Signed   By: Franky Crease M.D.   On: 12/01/2023 23:49    EKG: Independently reviewed. Sinus rhythm.   Assessment/Plan  1. Sickle cell anemia with pain crisis;  chronic pain  - Start Dilaudid  PCA, schedule Toradol , continue 0.45% NaCl, continue long-acting oxycodone  and gabapentin    8. Leukocytosis  - No fever or apparent infectious process, likely reactive      DVT prophylaxis: Lovenox   Code Status: Full  Level of Care: Level of care: Med-Surg Family Communication: Wife updated from ED   Disposition Plan:  Patient is from: home  Anticipated d/c is to: Home  Anticipated d/c date is: 12/05/23  Patient currently: Pending pain-control  Consults called: none  Admission status: Inpatient     Evalene GORMAN Sprinkles, MD [facility]  12/02/2023, 5:56 AM

## 2023-12-03 LAB — CBC
HCT: 19.7 % — ABNORMAL LOW (ref 39.0–52.0)
Hemoglobin: 6.8 g/dL — CL (ref 13.0–17.0)
MCH: 33.3 pg (ref 26.0–34.0)
MCHC: 34.5 g/dL (ref 30.0–36.0)
MCV: 96.6 fL (ref 80.0–100.0)
Platelets: 210 10*3/uL (ref 150–400)
RBC: 2.04 MIL/uL — ABNORMAL LOW (ref 4.22–5.81)
RDW: 27.4 % — ABNORMAL HIGH (ref 11.5–15.5)
WBC: 23 10*3/uL — ABNORMAL HIGH (ref 4.0–10.5)
nRBC: 16.8 % — ABNORMAL HIGH (ref 0.0–0.2)

## 2023-12-03 LAB — GLUCOSE, CAPILLARY
Glucose-Capillary: 108 mg/dL — ABNORMAL HIGH (ref 70–99)
Glucose-Capillary: 114 mg/dL — ABNORMAL HIGH (ref 70–99)
Glucose-Capillary: 117 mg/dL — ABNORMAL HIGH (ref 70–99)
Glucose-Capillary: 117 mg/dL — ABNORMAL HIGH (ref 70–99)
Glucose-Capillary: 118 mg/dL — ABNORMAL HIGH (ref 70–99)
Glucose-Capillary: 141 mg/dL — ABNORMAL HIGH (ref 70–99)

## 2023-12-03 LAB — HIV ANTIBODY (ROUTINE TESTING W REFLEX): HIV Screen 4th Generation wRfx: NONREACTIVE

## 2023-12-03 LAB — LACTIC ACID, PLASMA: Lactic Acid, Venous: 1 mmol/L (ref 0.5–1.9)

## 2023-12-03 NOTE — Progress Notes (Cosign Needed Addendum)
 Patient ID: Michael Mullins, male   DOB: October 03, 1980, 43 y.o.   MRN: [id number] Subjective: Michael Mullins is a 43 year old male with a medical history significant for sickle cell disease, opiate dependence and tolerance, hypertension, type 2 diabetes, and obesity was admitted for back pain, left arm, left leg, and right lateral rib pain in the setting of sickle cell pain crisis.  Today, patient is reporting pain improved on IV Dilaudid  PCA. Patient does not have any new complaints.  Oxygen  saturation remains greater than 90 on 2 L supplemental oxygen .  Patient denies shortness of breath, dizziness, blurry vision, urinary symptoms, nausea, vomiting, or diarrhea.    Objective:  Vital signs in last 24 hours:  Vitals:   12/08/23 0604 12/08/23 0749 12/08/23 1003 12/08/23 1136  BP: (!) 145/77  (!) 140/76   Pulse: (!) 107  (!) 105   Resp: 16 14 20 14   Temp: 98.9 F (37.2 C)  98.6 F (37 C)   TempSrc: Oral  Oral   SpO2: 96% 100% 98% 100%  Weight:      Height:        Intake/Output from previous day:   Intake/Output Summary (Last 24 hours) at 12/08/2023 1141 Last data filed at 12/08/2023 9047 Gross per 24 hour  Intake 1040 ml  Output 3975 ml  Net -2935 ml    Physical Exam: General: Alert, awake, oriented x3, in no acute distress.  HEENT: [place]/AT PEERL, EOMI Neck: Trachea midline,  no masses, no thyromegal,y no JVD, no carotid bruit OROPHARYNX:  Moist, No exudate/ erythema/lesions.  Heart: Regular rate and rhythm, without murmurs, rubs, gallops, PMI non-displaced, no heaves or thrills on palpation.  Lungs: Clear to auscultation, no wheezing or rhonchi noted. No increased vocal fremitus resonant to percussion  Abdomen: Soft, nontender, nondistended, positive bowel sounds, no masses no hepatosplenomegaly noted..  Neuro: No focal neurological deficits noted cranial nerves II through XII grossly intact. DTRs 2+ bilaterally upper and lower extremities. Strength 5 out of 5 in bilateral upper and  lower extremities. Musculoskeletal:, Left arm, left leg and right lateral rib tenderness.   Psychiatric: Patient alert and oriented x3, good insight and cognition, good recent to remote recall. Lymph node survey: No cervical axillary or inguinal lymphadenopathy noted.  Lab Results:  Basic Metabolic Panel:    Component Value Date/Time   NA 139 12/07/2023 1026   K 3.8 12/07/2023 1026   CL 106 12/07/2023 1026   CO2 24 12/07/2023 1026   BUN 8 12/07/2023 1026   CREATININE 0.56 (L) 12/07/2023 1026   GLUCOSE 133 (H) 12/07/2023 1026   CALCIUM  8.6 (L) 12/07/2023 1026   CBC:    Component Value Date/Time   WBC 16.0 (H) 12/07/2023 1026   HGB 7.0 (L) 12/07/2023 1026   HCT 21.4 (L) 12/07/2023 1026   PLT 191 12/07/2023 1026   MCV 104.9 (H) 12/07/2023 1026   NEUTROABS 8.9 (H) 12/07/2023 1026   LYMPHSABS 3.0 12/07/2023 1026   MONOABS 3.2 (H) 12/07/2023 1026   EOSABS 0.4 12/07/2023 1026   BASOSABS 0.1 12/07/2023 1026    Recent Results (from the past 240 hours)  Culture, blood (Routine X 2) w Reflex to ID Panel     Status: None   Collection Time: 12/02/23  7:36 PM   Specimen: BLOOD LEFT ARM  Result Value Ref Range Status   Specimen Description   Final    BLOOD LEFT ARM Performed at [facility], [address], [place], [place] [address]  Special Requests   Final    BOTTLES DRAWN AEROBIC AND ANAEROBIC Blood Culture adequate volume Performed at [facility], [address], [place], [place] [address]    Culture   Final    NO GROWTH 5 DAYS Performed at [facility], [address] [place], [place] [address]    Report Status 12/07/2023 FINAL  Final  Culture, blood (Routine X 2) w Reflex to ID Panel     Status: None   Collection Time: 12/02/23  7:39 PM   Specimen: BLOOD LEFT ARM  Result Value Ref Range Status   Specimen Description   Final    BLOOD LEFT ARM Performed at [facility], [address], [place], [place] [address]    Special  Requests   Final    BOTTLES DRAWN AEROBIC AND ANAEROBIC Blood Culture adequate volume Performed at [facility], [address], [place], [place] [address]    Culture   Final    NO GROWTH 5 DAYS Performed at [facility], [address], [place], [place] [address]    Report Status 12/07/2023 FINAL  Final    Studies/Results: No results found.   Medications: Scheduled Meds:  empagliflozin   10 mg Oral Daily   enoxaparin  (LOVENOX ) injection  40 mg Subcutaneous Q24H   folic acid   1 mg Oral Daily   gabapentin   300 mg Oral QHS   HYDROmorphone    Intravenous Q4H   lisinopril   2.5 mg Oral Daily   oxyCODONE   20 mg Oral Q12H   senna-docusate  1 tablet Oral BID   Continuous Infusions:   PRN Meds:.acetaminophen , diphenhydrAMINE , HYDROmorphone  (DILAUDID ) injection, naloxone  **AND** sodium chloride  flush, ondansetron  (ZOFRAN ) IV, oxyCODONE , polyethylene glycol, trimethobenzamide   Consultants: None  Procedures: None  Antibiotics: Rocephin  Azithromycin   Assessment/Plan: Principal Problem:   Sickle cell pain crisis (HCC) Active Problems:   Controlled type 2 diabetes mellitus with hyperglycemia (HCC)   Other chronic pain   Leukocytosis   Ulcer of leg, chronic, right (HCC)   Hb Sickle Cell Disease with Pain crisis: Continue IVF 0.45% Saline KVO, continue weight based Dilaudid  PCA, IV Toradol  15 mg Q 6 H for a total of 5 days, continue oral home pain medications as ordered. Monitor vitals very closely, Re-evaluate pain scale regularly, 2 L of Oxygen  by [place]. Patient encouraged to ambulate on the hallway today.  Leukocytosis: Elevated. Patient developed a fever and chest x-ray showed  Bibasilar scarring or atelectasis. Patient started on prophylactic antibiotic, Blood cultures, urinalysis, lactic acid labs completed results pending. Will continue to monitor daily cbc.  Anemia of Chronic Disease: Hemoglobin 6.8 g/dL below patient's baseline. Will continue to  monitor daily CBC.  No transfusion at this time. Chronic pain Syndrome: Continue oral home pain medication. Controlled type 2 diabetes mellitus with hyperglycemia: Stable, q 4 cbg, Continue Jardiance  as prescribed. Ulcer of leg, chronic, right (HCC): clean wound dressing, no s/s of infection. Wound care consult completed   Code Status: Full Code Family Communication: N/A Disposition Plan: Not yet ready for discharge  Homer CHRISTELLA Cover NP  If 7PM-7AM, please contact night-coverage.  12/03/2023, 11:04 AM  LOS: 6 days

## 2023-12-03 NOTE — Progress Notes (Signed)
   12/03/23 1546  TOC Brief Assessment  Insurance and Status Reviewed  Patient has primary care physician Yes  Home environment has been reviewed single family home  Prior level of function: independent  Prior/Current Home Services No current home services  Social Drivers of Health Review SDOH reviewed no interventions necessary  Readmission risk has been reviewed Yes  Transition of care needs no transition of care needs at this time    Heather Saltness, MSW, LCSW 12/03/2023 3:46 PM

## 2023-12-03 NOTE — Plan of Care (Signed)

## 2023-12-03 NOTE — Consult Note (Addendum)
 WOC Nurse Consult Note: Consult requested to provide topical treatment orders for right leg.  Pt is followed by the outpatient wound care center at [facility], and states he wears an [place] boot with a dressing beneath for a chronic wound; these dressings and compression wraps are due to be changed Q FRI, and he plans to resume follow-up with the clinic after discharge.    Discussed plan of care with patient and topical treatment orders provided for bedside nurses as follows: Leave right leg Una boot in place, if patient is still in the hospital on FRI, then [facility] nurse will remove and assess the wound. Thank-you,  Stephane Fought MSN, RN, CWOCN, [place], CNS [phone]

## 2023-12-04 DIAGNOSIS — L97919 Non-pressure chronic ulcer of unspecified part of right lower leg with unspecified severity: Secondary | ICD-10-CM | POA: Insufficient documentation

## 2023-12-04 LAB — PREPARE RBC (CROSSMATCH)

## 2023-12-04 LAB — GLUCOSE, CAPILLARY
Glucose-Capillary: 109 mg/dL — ABNORMAL HIGH (ref 70–99)
Glucose-Capillary: 118 mg/dL — ABNORMAL HIGH (ref 70–99)
Glucose-Capillary: 120 mg/dL — ABNORMAL HIGH (ref 70–99)
Glucose-Capillary: 123 mg/dL — ABNORMAL HIGH (ref 70–99)
Glucose-Capillary: 123 mg/dL — ABNORMAL HIGH (ref 70–99)
Glucose-Capillary: 135 mg/dL — ABNORMAL HIGH (ref 70–99)

## 2023-12-04 LAB — CBC
HCT: 18.9 % — ABNORMAL LOW (ref 39.0–52.0)
Hemoglobin: 6.6 g/dL — CL (ref 13.0–17.0)
MCH: 33.5 pg (ref 26.0–34.0)
MCHC: 34.9 g/dL (ref 30.0–36.0)
MCV: 95.9 fL (ref 80.0–100.0)
Platelets: 196 10*3/uL (ref 150–400)
RBC: 1.97 MIL/uL — ABNORMAL LOW (ref 4.22–5.81)
RDW: 28.4 % — ABNORMAL HIGH (ref 11.5–15.5)
WBC: 22.3 10*3/uL — ABNORMAL HIGH (ref 4.0–10.5)
nRBC: 16.1 % — ABNORMAL HIGH (ref 0.0–0.2)

## 2023-12-04 LAB — TYPE AND SCREEN: Antibody Screen: POSITIVE

## 2023-12-04 LAB — HEMOGLOBIN AND HEMATOCRIT, BLOOD
HCT: 20.8 % — ABNORMAL LOW (ref 39.0–52.0)
Hemoglobin: 7.2 g/dL — ABNORMAL LOW (ref 13.0–17.0)

## 2023-12-04 MED ORDER — DIPHENHYDRAMINE HCL 25 MG PO CAPS
25.0000 mg | ORAL_CAPSULE | Freq: Once | ORAL | Status: AC
  Administered 2023-12-04: 25 mg via ORAL
  Filled 2023-12-04: qty 1

## 2023-12-04 MED ORDER — ACETAMINOPHEN 325 MG PO TABS
650.0000 mg | ORAL_TABLET | Freq: Once | ORAL | Status: AC
  Administered 2023-12-04: 650 mg via ORAL
  Filled 2023-12-04: qty 2

## 2023-12-04 MED ORDER — SODIUM CHLORIDE 0.9% IV SOLUTION: Freq: Once | INTRAVENOUS | Status: AC

## 2023-12-04 NOTE — Progress Notes (Signed)
 Patient ID: SIM CHOQUETTE, male   DOB: 10-22-1980, 43 y.o.   MRN: [id number] Subjective: Harsha Yusko is a 43 year old male with a medical history significant for sickle cell disease, opiate dependence and tolerance, hypertension, type 2 diabetes, and obesity was admitted for back pain, left arm, left leg, and right lateral rib pain in the setting of sickle cell pain crisis. Patient is reporting pain of 5/10 today. Patient does not have any new complaints. Advised patient that [facility] nurse from [facility] will evaluate and change dressings while he is on admission.  Patient denies shortness of breath, dizziness, blurry vision, urinary symptoms, nausea, vomiting, or diarrhea.    Objective:  Vital signs in last 24 hours:  Vitals:   12/04/23 0436 12/04/23 0500 12/04/23 0830 12/04/23 1225  BP: 123/70     Pulse: (!) 110     Resp: 19 15 14 16   Temp: 98.3 F (36.8 C)     TempSrc: Oral     SpO2: (!) 88%  98% 97%  Weight:      Height:        Intake/Output from previous day:   Intake/Output Summary (Last 24 hours) at 12/04/2023 1315 Last data filed at 12/04/2023 0930 Gross per 24 hour  Intake 960 ml  Output 3800 ml  Net -2840 ml    Physical Exam: General: Alert, awake, oriented x3, in no acute distress.  HEENT: [place]/AT PEERL, EOMI Neck: Trachea midline,  no masses, no thyromegal,y no JVD, no carotid bruit OROPHARYNX:  Moist, No exudate/ erythema/lesions.  Heart: Regular rate and rhythm, without murmurs, rubs, gallops, PMI non-displaced, no heaves or thrills on palpation.  Lungs: Clear to auscultation, no wheezing or rhonchi noted. No increased vocal fremitus resonant to percussion  Abdomen: Soft, nontender, nondistended, positive bowel sounds, no masses no hepatosplenomegaly noted..  Neuro: No focal neurological deficits noted cranial nerves II through XII grossly intact. DTRs 2+ bilaterally upper and lower extremities. Strength 5 out of 5 in bilateral upper and lower  extremities. Musculoskeletal:, Left arm, left leg and right lateral rib tenderness.   Psychiatric: Patient alert and oriented x3, good insight and cognition, good recent to remote recall. Lymph node survey: No cervical axillary or inguinal lymphadenopathy noted.  Lab Results:  Basic Metabolic Panel:    Component Value Date/Time   NA 139 12/02/2023 0003   K 4.1 12/02/2023 0003   CL 105 12/02/2023 0003   CO2 22 12/02/2023 0003   BUN 14 12/02/2023 0003   CREATININE 0.62 12/02/2023 0003   GLUCOSE 129 (H) 12/02/2023 0003   CALCIUM  9.4 12/02/2023 0003   CBC:    Component Value Date/Time   WBC 22.3 (H) 12/04/2023 0401   HGB 6.6 (LL) 12/04/2023 0401   HCT 18.9 (L) 12/04/2023 0401   PLT 196 12/04/2023 0401   MCV 95.9 12/04/2023 0401   NEUTROABS 11.5 (H) 12/02/2023 0003   LYMPHSABS 6.6 (H) 12/02/2023 0003   MONOABS 5.4 (H) 12/02/2023 0003   EOSABS 0.2 12/02/2023 0003   BASOSABS 0.0 12/02/2023 0003    Recent Results (from the past 240 hours)  Culture, blood (Routine X 2) w Reflex to ID Panel     Status: None (Preliminary result)   Collection Time: 12/02/23  7:36 PM   Specimen: BLOOD LEFT ARM  Result Value Ref Range Status   Specimen Description   Final    BLOOD LEFT ARM Performed at [facility], [address], [place], [place] [address]    Special Requests  Final    BOTTLES DRAWN AEROBIC AND ANAEROBIC Blood Culture adequate volume Performed at [facility], [address] [place], [place] [address]    Culture   Final    NO GROWTH 2 DAYS Performed at [facility], [address] [place], [place] [address]    Report Status PENDING  Incomplete  Culture, blood (Routine X 2) w Reflex to ID Panel     Status: None (Preliminary result)   Collection Time: 12/02/23  7:39 PM   Specimen: BLOOD LEFT ARM  Result Value Ref Range Status   Specimen Description   Final    BLOOD LEFT ARM Performed at [name], [address], [place],  [place] [address]    Special Requests   Final    BOTTLES DRAWN AEROBIC AND ANAEROBIC Blood Culture adequate volume Performed at [facility], [address], [place], [place] [address]    Culture   Final    NO GROWTH 2 DAYS Performed at [facility], [address] [place], [place] [address]    Report Status PENDING  Incomplete    Studies/Results: DG CHEST PORT 1 VIEW Result Date: 12/02/2023 CLINICAL DATA:  141880 SOB (shortness of breath) 141880 EXAM: PORTABLE CHEST - 1 VIEW COMPARISON:  12/01/2023 FINDINGS: Stable left infrahilar scarring or atelectasis. Lungs otherwise clear. Heart size and mediastinal contours are within normal limits. No effusion. Visualized bones unremarkable. IMPRESSION: No acute cardiopulmonary disease. Electronically Signed   By: JONETTA Faes M.D.   On: 12/02/2023 17:45    Medications: Scheduled Meds:  sodium chloride    Intravenous Once   acetaminophen   650 mg Oral Once   diphenhydrAMINE   25 mg Oral Once   empagliflozin   10 mg Oral Daily   enoxaparin  (LOVENOX ) injection  40 mg Subcutaneous Q24H   folic acid   1 mg Oral Daily   gabapentin   300 mg Oral QHS   HYDROmorphone    Intravenous Q4H   ketorolac   15 mg Intravenous Q6H   lisinopril   2.5 mg Oral Daily   oxyCODONE   20 mg Oral Q12H   senna-docusate  1 tablet Oral BID   Continuous Infusions:  azithromycin  500 mg (12/03/23 1659)   cefTRIAXone  (ROCEPHIN )  IV 2 g (12/03/23 1800)   PRN Meds:.acetaminophen , diphenhydrAMINE , HYDROmorphone  (DILAUDID ) injection, naloxone  **AND** sodium chloride  flush, ondansetron  (ZOFRAN ) IV, oxyCODONE , polyethylene glycol, trimethobenzamide  Consultants: Wound care  Procedures: None  Antibiotics: Rocephin  Azithromycin    Assessment/Plan: Principal Problem:   Sickle cell pain crisis (HCC) Active Problems:   Controlled type 2 diabetes mellitus with hyperglycemia (HCC)   Other chronic pain   Leukocytosis   Ulcer of leg, chronic, right (HCC)   Hb  Sickle Cell Disease with Pain crisis: Continue IVF 0.45% Saline KVO, continue weight based Dilaudid  PCA, IV Toradol  15 mg Q 6 H for a total of 5 days, continue oral home pain medications as ordered. Monitor vitals very closely, Re-evaluate pain scale regularly, 2 L of Oxygen  by [place]. Patient encouraged to ambulate on the hallway today. Leukocytosis: Elevated at 22.3 slightly lower than yesterday. Fever resolved. Repeat chest x-ray 12/02/23: shows no acute cardiopulmonary disease. Will continue to monitor daily CBC  Anemia of Chronic Disease: Hemoglobin 6.6 g/dL below patient's baseline. Transfuse 1 unit PRBC. Monitor daily CBC Chronic pain Syndrome: Continue oral home pain medication. Controlled type 2 diabetes mellitus with hyperglycemia: Stable, continue to monitor CBG every 4 hours.  Continue Jardiance  as prescribed. Ulcer of leg, chronic, right (  HCC): Clean wound dressing, no s/s of infection. Wound care consult completed      Code Status: Full Code Family Communication: N/A Disposition Plan: Not yet ready for discharge  Homer CHRISTELLA Cover NP  If 7PM-7AM, please contact night-coverage.  12/04/2023, 1:15 PM  LOS: 2 days

## 2023-12-04 NOTE — Telephone Encounter (Signed)
 Patient wanted to let Dr. Milissa know that he is in the hospital at [facility] and he said they will be doing a blood transfusion tonight or tomorrow.  He want's to cancel all his appointments for tomorrow [phone]

## 2023-12-04 NOTE — Telephone Encounter (Signed)
 Sorry to hear he is in hospital, noted. I'll submit for the 6/27 appt to be cancelled.   Scheduled Future Appointments       Provider Department Dept Phone Center   12/05/2023 9:00 AM Patriciaann Roers [facility] - Wound Care Center [phone] [facility] [place]   12/05/2023 12:00 PM [facility] INFUSION CCC POD E [facility] - [place] Hematology Oncology Infusion [phone] [facility] Comp Can   12/19/2023 8:30 AM [facility] IR5 [facility] - INTERVENTIONAL RADIOLOGY RT [phone] [facility] [place]   12/19/2023 9:30 AM [facility] INFUSION CCC POD Q [facility] [place] Hematology Oncology Infusion [phone] [facility] Comp Can   12/25/2023 8:40 AM Camie Almarie Quale [facility] Internal Medicine [place] [phone] [facility] WS   01/02/2024 9:00 AM [facility] IR5 [facility] - INTERVENTIONAL RADIOLOGY RT [phone] [facility] [place]   01/02/2024 10:20 AM [facility] INFUSION CCC POD A [facility] [place] Hematology Oncology Infusion [phone] [facility] Comp Can   02/25/2024 9:30 AM HEM ONC 03 LAB [facility] [employer] [facility] [place] Hematology Oncology [phone] [facility] Comp Can   02/25/2024 10:00 AM Bernardino Ill [facility] [place] [facility] [place] Hematology Oncology [phone] [facility] Comp Can       Electronically signed by: Bernardino Ill, MD 12/04/2023 10:49 AM

## 2023-12-05 ENCOUNTER — Other Ambulatory Visit (HOSPITAL_COMMUNITY): Payer: Self-pay

## 2023-12-05 LAB — CBC
HCT: 18.7 % — ABNORMAL LOW (ref 39.0–52.0)
Hemoglobin: 6.4 g/dL — CL (ref 13.0–17.0)
MCH: 33 pg (ref 26.0–34.0)
MCHC: 34.2 g/dL (ref 30.0–36.0)
MCV: 96.4 fL (ref 80.0–100.0)
Platelets: 190 10*3/uL (ref 150–400)
RBC: 1.94 MIL/uL — ABNORMAL LOW (ref 4.22–5.81)
RDW: 26 % — ABNORMAL HIGH (ref 11.5–15.5)
WBC: 16.6 10*3/uL — ABNORMAL HIGH (ref 4.0–10.5)
nRBC: 33.3 % — ABNORMAL HIGH (ref 0.0–0.2)

## 2023-12-05 LAB — COMPREHENSIVE METABOLIC PANEL WITH GFR
ALT: 31 U/L (ref 0–44)
AST: 102 U/L — ABNORMAL HIGH (ref 15–41)
Albumin: 3.7 g/dL (ref 3.5–5.0)
Alkaline Phosphatase: 79 U/L (ref 38–126)
Anion gap: 11 (ref 5–15)
BUN: 11 mg/dL (ref 6–20)
CO2: 21 mmol/L — ABNORMAL LOW (ref 22–32)
Calcium: 8.4 mg/dL — ABNORMAL LOW (ref 8.9–10.3)
Chloride: 106 mmol/L (ref 98–111)
Creatinine, Ser: 0.57 mg/dL — ABNORMAL LOW (ref 0.61–1.24)
GFR, Estimated: >60 mL/min (ref >60)
Glucose, Bld: 111 mg/dL — ABNORMAL HIGH (ref 70–99)
Potassium: 4.9 mmol/L (ref 3.5–5.1)
Sodium: 138 mmol/L (ref 135–145)
Total Bilirubin: 9 mg/dL — ABNORMAL HIGH (ref 0.0–1.2)
Total Protein: 7.3 g/dL (ref 6.5–8.1)

## 2023-12-05 LAB — GLUCOSE, CAPILLARY
Glucose-Capillary: 100 mg/dL — ABNORMAL HIGH (ref 70–99)
Glucose-Capillary: 119 mg/dL — ABNORMAL HIGH (ref 70–99)
Glucose-Capillary: 126 mg/dL — ABNORMAL HIGH (ref 70–99)
Glucose-Capillary: 140 mg/dL — ABNORMAL HIGH (ref 70–99)
Glucose-Capillary: 142 mg/dL — ABNORMAL HIGH (ref 70–99)
Glucose-Capillary: 149 mg/dL — ABNORMAL HIGH (ref 70–99)

## 2023-12-05 LAB — PREPARE RBC (CROSSMATCH)

## 2023-12-05 MED ORDER — XTAMPZA ER 27 MG PO C12A: 27.0000 mg | EXTENDED_RELEASE_CAPSULE | Freq: Two times a day (BID) | ORAL | 0 refills | Status: AC

## 2023-12-05 MED ORDER — SODIUM CHLORIDE 0.9% IV SOLUTION: Freq: Once | INTRAVENOUS | Status: AC

## 2023-12-05 MED ORDER — AZITHROMYCIN 250 MG PO TABS
500.0000 mg | ORAL_TABLET | Freq: Every day | ORAL | Status: AC
  Administered 2023-12-05 – 2023-12-06 (×2): 500 mg via ORAL
  Filled 2023-12-05 (×2): qty 2

## 2023-12-05 MED ORDER — OXYCODONE HCL 15 MG PO TABS
15.0000 mg | ORAL_TABLET | Freq: Three times a day (TID) | ORAL | 0 refills | Status: DC | PRN
  Filled 2023-12-05: qty 145, 29d supply, fill #0
  Filled 2023-12-05: qty 5, 1d supply, fill #0

## 2023-12-05 NOTE — Consult Note (Signed)
 WOC Nurse Consult Note: Refer to previous WOC consult note.  Pt is followed by the outpatient wound care center at [facility] and is due to have dressing and Una boot changed today.  Right outer ankle with chronic full thickness Sickle cell wound; painful to touch, 80% red, 20% yellow, mod amt tan drainage, no odor. 3.5X1.8X.3cm.  Cleansed with NS and applied Aquacel dressing and piece of ABD pad, as was the previous routine.  Orders provided for ortho tech to apply right Una boot and change Q Fri if patient is still in the hospital at that time. He plans to resume follow-up with the wound clinic after discharge.  Topical treatment orders provided for bedside nurses to perform as follows: Change Q FRI: Bedside nurse; cleanse right ankle wound  with NS, then apply a piece of Aquacel Soila # [phone]) and  a piece of ABD pad dressing, then page the ortho tech to reapply right [employer].  Please re-consult if further assistance is needed.  Thank-you,  Stephane Fought MSN, RN, CWOCN, [place], CNS [phone]

## 2023-12-05 NOTE — Progress Notes (Signed)
Agree with previous RN assessment. Will continue to monitor.  

## 2023-12-05 NOTE — Progress Notes (Signed)
 Orthopedic Tech Progress Note Patient Details:  Michael Mullins [birth year] [id number]  Ortho Devices Type of Ortho Device: Radio broadcast assistant Ortho Device/Splint Location: Right leg Ortho Device/Splint Interventions: Application   Post Interventions Patient Tolerated: Well  Massie BRAVO Trini Christiansen 12/05/2023, 9:01 AM

## 2023-12-05 NOTE — Progress Notes (Signed)
 Patient ID: Michael Mullins, male   DOB: 12-Feb-1981, 43 y.o.   MRN: [id number] Subjective: Michael Mullins is a 43 year old male with a medical history significant for sickle cell disease, opiate dependence and tolerance, hypertension, type 2 diabetes, and obesity was admitted for back pain, left arm, left leg, and right lateral rib pain in the setting of sickle cell pain crisis. Patient is reporting pain of 2/10 today. Patient does not have any new complaints. Advised patient that [facility] will evaluate and change dressings while he is on admission.  Patient denies shortness of breath, dizziness, blurry vision, urinary symptoms, nausea, vomiting, or diarrhea.    Objective:  Vital signs in last 24 hours:  Vitals:   12/05/23 1211 12/05/23 1215 12/05/23 1304 12/05/23 1347  BP:   121/67 117/71  Pulse:   83 78  Resp: 13 13 16 15   Temp:   98.4 F (36.9 C)   TempSrc:   Oral Oral  SpO2:   97% 98%  Weight:      Height:        Intake/Output from previous day:   Intake/Output Summary (Last 24 hours) at 12/05/2023 1351 Last data filed at 12/05/2023 1347 Gross per 24 hour  Intake 2314.83 ml  Output 1725 ml  Net 589.83 ml    Physical Exam: General: Alert, awake, oriented x3, in no acute distress.  HEENT: [place]/AT PEERL, EOMI Neck: Trachea midline,  no masses, no thyromegal,y no JVD, no carotid bruit OROPHARYNX:  Moist, No exudate/ erythema/lesions.  Heart: Regular rate and rhythm, without murmurs, rubs, gallops, PMI non-displaced, no heaves or thrills on palpation.  Lungs: Clear to auscultation, no wheezing or rhonchi noted. No increased vocal fremitus resonant to percussion  Abdomen: Soft, nontender, nondistended, positive bowel sounds, no masses no hepatosplenomegaly noted..  Neuro: No focal neurological deficits noted cranial nerves II through XII grossly intact. DTRs 2+ bilaterally upper and lower extremities. Strength 5 out of 5 in bilateral upper and lower  extremities. Musculoskeletal:, Left arm, left leg and right lateral rib tenderness.   Psychiatric: Patient alert and oriented x3, good insight and cognition, good recent to remote recall. Lymph node survey: No cervical axillary or inguinal lymphadenopathy noted.  Lab Results:  Basic Metabolic Panel:    Component Value Date/Time   NA 138 12/05/2023 0407   K 4.9 12/05/2023 0407   CL 106 12/05/2023 0407   CO2 21 (L) 12/05/2023 0407   BUN 11 12/05/2023 0407   CREATININE 0.57 (L) 12/05/2023 0407   GLUCOSE 111 (H) 12/05/2023 0407   CALCIUM  8.4 (L) 12/05/2023 0407   CBC:    Component Value Date/Time   WBC 16.6 (H) 12/05/2023 0638   HGB 6.4 (LL) 12/05/2023 0638   HCT 18.7 (L) 12/05/2023 0638   PLT 190 12/05/2023 0638   MCV 96.4 12/05/2023 0638   NEUTROABS 11.5 (H) 12/02/2023 0003   LYMPHSABS 6.6 (H) 12/02/2023 0003   MONOABS 5.4 (H) 12/02/2023 0003   EOSABS 0.2 12/02/2023 0003   BASOSABS 0.0 12/02/2023 0003    Recent Results (from the past 240 hours)  Culture, blood (Routine X 2) w Reflex to ID Panel     Status: None (Preliminary result)   Collection Time: 12/02/23  7:36 PM   Specimen: BLOOD LEFT ARM  Result Value Ref Range Status   Specimen Description   Final    BLOOD LEFT ARM Performed at [facility], [address] [place], [place] [address]    Special Requests  Final    BOTTLES DRAWN AEROBIC AND ANAEROBIC Blood Culture adequate volume Performed at [facility], [address] [place], [place] [address]    Culture   Final    NO GROWTH 3 DAYS Performed at [facility], [address], [place], [place] [address]    Report Status PENDING  Incomplete  Culture, blood (Routine X 2) w Reflex to ID Panel     Status: None (Preliminary result)   Collection Time: 12/02/23  7:39 PM   Specimen: BLOOD LEFT ARM  Result Value Ref Range Status   Specimen Description   Final    BLOOD LEFT ARM Performed at [facility], [address],  [place], [place] [address]    Special Requests   Final    BOTTLES DRAWN AEROBIC AND ANAEROBIC Blood Culture adequate volume Performed at [facility], [address] [place], [place] [address]    Culture   Final    NO GROWTH 3 DAYS Performed at [facility], [address] [place], [place] [address]    Report Status PENDING  Incomplete    Studies/Results: No results found.   Medications: Scheduled Meds:  azithromycin   500 mg Oral q1800   empagliflozin   10 mg Oral Daily   enoxaparin  (LOVENOX ) injection  40 mg Subcutaneous Q24H   folic acid   1 mg Oral Daily   gabapentin   300 mg Oral QHS   HYDROmorphone    Intravenous Q4H   ketorolac   15 mg Intravenous Q6H   lisinopril   2.5 mg Oral Daily   oxyCODONE   20 mg Oral Q12H   senna-docusate  1 tablet Oral BID   Continuous Infusions:  cefTRIAXone  (ROCEPHIN )  IV 2 g (12/04/23 1801)   PRN Meds:.acetaminophen , diphenhydrAMINE , HYDROmorphone  (DILAUDID ) injection, naloxone  **AND** sodium chloride  flush, ondansetron  (ZOFRAN ) IV, oxyCODONE , polyethylene glycol, trimethobenzamide   Consultants: Wound care  Procedures: None  Antibiotics: Rocephin  Azithromycin    Assessment/Plan: Principal Problem:   Sickle cell pain crisis (HCC) Active Problems:   Controlled type 2 diabetes mellitus with hyperglycemia (HCC)   Other chronic pain   Leukocytosis   Ulcer of leg, chronic, right (HCC)   Hb Sickle Cell Disease with Pain crisis: Continue IVF 0.45% Saline KVO, continue weight based Dilaudid  PCA, IV Toradol  15 mg Q 6 H for a total of 5 days, continue oral home pain medications as ordered. Monitor vitals very closely, Re-evaluate pain scale regularly, 2 L of Oxygen  by [place]. Patient encouraged to ambulate on the hallway today. Leukocytosis: Elevated at 16.6 gradually improving from yesterday. Fever resolved. Repeat chest x-ray 12/02/23: shows no acute cardiopulmonary disease. Will continue to monitor daily CBC  Anemia of  Chronic Disease: Hemoglobin 6.4 g/dL below patient's baseline. Transfuse 1 more unit PRBC. Making a total of 2 units. Monitor daily CBC Chronic pain Syndrome: Continue oral home pain medication. Controlled type 2 diabetes mellitus with hyperglycemia: Stable, continue to monitor CBG every 4 hours.  Continue Jardiance  as prescribed. Ulcer of leg, chronic, right [facility]): Clean wound dressing, no s/s of infection. Wound care consult completed      Code Status: Full Code Family Communication: N/A Disposition Plan: Not yet ready for discharge  Homer CHRISTELLA Cover NP  If 7PM-7AM, please contact night-coverage.  12/05/2023, 1:51 PM  LOS: 3 days

## 2023-12-05 NOTE — Plan of Care (Signed)
   Problem: Health Behavior/Discharge Planning: Goal: Ability to manage health-related needs will improve Outcome: Progressing   Problem: Clinical Measurements: Goal: Ability to maintain clinical measurements within normal limits will improve Outcome: Progressing Goal: Will remain free from infection Outcome: Progressing

## 2023-12-05 NOTE — Telephone Encounter (Signed)
 Pt calling, he says he had to go to the hospital today and he got transfused PRBC's. That is why his appt was cancelled, asking for refill of Oxy and Xtampza  to be sent to [facility].  Rumalda Furnace, RN

## 2023-12-05 NOTE — Telephone Encounter (Signed)
 Covering SS pool. Reviewed medical record and our recent pain regimen plan, reviewed our recent notes, I reviewed the PRMP program on 12/05/2023 on 3:46 PM, I see no red flags and recent fills are all from our our benign hematology subgroup and team. Refill appears appropriate, and Pt has upcoming appt. with us . Rx refilled electronically.   Note for xtampza  able to be filled 6/30. Keeping future simple exchange treatment appointments as scheduled at this time

## 2023-12-06 DIAGNOSIS — D5700 Hb-SS disease with crisis, unspecified: Secondary | ICD-10-CM | POA: Diagnosis not present

## 2023-12-06 LAB — GLUCOSE, CAPILLARY
Glucose-Capillary: 113 mg/dL — ABNORMAL HIGH (ref 70–99)
Glucose-Capillary: 107 mg/dL — ABNORMAL HIGH (ref 70–99)
Glucose-Capillary: 138 mg/dL — ABNORMAL HIGH (ref 70–99)
Glucose-Capillary: 125 mg/dL — ABNORMAL HIGH (ref 70–99)
Glucose-Capillary: 123 mg/dL — ABNORMAL HIGH (ref 70–99)

## 2023-12-06 NOTE — Plan of Care (Signed)
  Problem: Health Behavior/Discharge Planning: Goal: Ability to manage health-related needs will improve Outcome: Progressing   Problem: Clinical Measurements: Goal: Ability to maintain clinical measurements within normal limits will improve Outcome: Progressing Goal: Will remain free from infection Outcome: Progressing Goal: Diagnostic test results will improve Outcome: Progressing Goal: Respiratory complications will improve Outcome: Progressing Goal: Cardiovascular complication will be avoided Outcome: Progressing   Problem: Activity: Goal: Risk for activity intolerance will decrease Outcome: Progressing   Problem: Nutrition: Goal: Adequate nutrition will be maintained Outcome: Progressing   Problem: Coping: Goal: Level of anxiety will decrease Outcome: Progressing   Problem: Elimination: Goal: Will not experience complications related to bowel motility Outcome: Progressing Goal: Will not experience complications related to urinary retention Outcome: Progressing   Problem: Pain Managment: Goal: General experience of comfort will improve and/or be controlled Outcome: Progressing   Problem: Safety: Goal: Ability to remain free from injury will improve Outcome: Progressing   Problem: Skin Integrity: Goal: Risk for impaired skin integrity will decrease Outcome: Progressing   Problem: Education: Goal: Knowledge of vaso-occlusive preventative measures will improve Outcome: Progressing Goal: Awareness of infection prevention will improve Outcome: Progressing Goal: Awareness of signs and symptoms of anemia will improve Outcome: Progressing Goal: Long-term complications will improve Outcome: Progressing   Problem: Self-Care: Goal: Ability to incorporate actions that prevent/reduce pain crisis will improve Outcome: Progressing   Problem: Bowel/Gastric: Goal: Gut motility will be maintained Outcome: Progressing   Problem: Tissue Perfusion: Goal: Complications  related to inadequate tissue perfusion will be avoided or minimized Outcome: Progressing   Problem: Respiratory: Goal: Pulmonary complications will be avoided or minimized Outcome: Progressing Goal: Acute Chest Syndrome will be identified early to prevent complications Outcome: Progressing   Problem: Fluid Volume: Goal: Ability to maintain a balanced intake and output will improve Outcome: Progressing   Problem: Sensory: Goal: Pain level will decrease with appropriate interventions Outcome: Progressing   Problem: Health Behavior: Goal: Postive changes in compliance with treatment and prescription regimens will improve Outcome: Progressing

## 2023-12-06 NOTE — Progress Notes (Signed)
 SICKLE CELL SERVICE PROGRESS NOTE  Michael Mullins FMW:[id number] DOB: Sep 16, 1980 DOA: 12/01/2023 PCP: Van Camie Norris, MD  Assessment/Plan: Principal Problem:   Sickle cell pain crisis [facility]) Active Problems:   Controlled type 2 diabetes mellitus with hyperglycemia (HCC)   Other chronic pain   Leukocytosis   Ulcer of leg, chronic, right (HCC)  Sickle cell pain crisis: Patient appears to be doing better.  Still on Dilaudid  PCA and other medications.  No fever no chills. Anemia of chronic disease: H&H is stable.  Continue to monitor Leukocytosis: Improved.  Secondary to vaso-occlusive crisis. Chronic pain syndrome: Continue chronic pain medications Controlled diabetes type 2: Continue with sliding scale insulin  Right leg ulcers: Continue wound care  Code Status: Full code Family Communication: No family at bedside Disposition Plan: Home when ready  [facility]  Pager [phone]. If 7PM-7AM, please contact night-coverage.  12/06/2023, 1:42 PM  LOS: 4 days   Brief narrative: Michael Mullins is a 43 y.o. male with medical history significant for sickle cell anemia and chronic pain, now presenting for evaluation of severe pain.   Patient reports that he woke yesterday morning with severe pain in his back, left arm, left leg, and right lateral ribs.  Symptoms are similar to his prior pain crises.  He is taking oxycodone  at home without relief.  Pain in the right ribs is worse if he touches it or lays on that side.  He denies shortness of breath, cough, fever, chills, abdominal pain, vomiting, diarrhea, or exertional chest pain.  Consultants: None  Procedures: Chest x-ray  Antibiotics: None  HPI/Subjective: Patient is doing better pain is 3 out of 10 appears to be stable.  No fever no chills no nausea vomiting or diarrhea.  Objective: Vitals:   12/06/23 0800 12/06/23 0844 12/06/23 1146 12/06/23 1230  BP:  116/72  122/64  Pulse:  80  76  Resp: 15 18 18 15   Temp:  98.4  F (36.9 C)  98.4 F (36.9 C)  TempSrc:  Oral    SpO2:  98%  97%  Weight:      Height:       Weight change:   Intake/Output Summary (Last 24 hours) at 12/06/2023 1342 Last data filed at 12/06/2023 1100 Gross per 24 hour  Intake 884.67 ml  Output 1775 ml  Net -890.33 ml    General: Alert, awake, oriented x3, in no acute distress.  HEENT: [place]/AT [place]ER[place], EOMI Neck: Trachea midline,  no masses, no thyromegal,y no JVD, no carotid bruit OROPHARYNX:  Moist, No exudate/ erythema/lesions.  Heart: Regular rate and rhythm, without murmurs, rubs, gallops, PMI non-displaced, no heaves or thrills on palpation.  Lungs: Clear to auscultation, no wheezing or rhonchi noted. No increased vocal fremitus resonant to percussion  Abdomen: Soft, nontender, nondistended, positive bowel sounds, no masses no hepatosplenomegaly noted..  Neuro: No focal neurological deficits noted cranial nerves II through XII grossly intact. DTRs 2+ bilaterally upper and lower extremities. Strength 5 out of 5 in bilateral upper and lower extremities. Musculoskeletal: No warm swelling or erythema around joints, no spinal tenderness noted. Psychiatric: Patient alert and oriented x3, good insight and cognition, good recent to remote recall. Lymph node survey: No cervical axillary or inguinal lymphadenopathy noted.   Data Reviewed: Basic Metabolic Panel: Recent Labs  Lab 12/02/23 0003 12/05/23 0407  NA 139 138  K 4.1 4.9  CL 105 106  CO2 22 21*  GLUCOSE 129* 111*  BUN 14 11  CREATININE 0.62 0.57*  CALCIUM   9.4 8.4*   Liver Function Tests: Recent Labs  Lab 12/02/23 0003 12/05/23 0407  AST 80* 102*  ALT 25 31  ALKPHOS 58 79  BILITOT 9.6* 9.0*  PROT 7.9 7.3  ALBUMIN 4.2 3.7   No results for input(s): LIPASE, AMYLASE in the last 168 hours. No results for input(s): AMMONIA in the last 168 hours. CBC: Recent Labs  Lab 12/02/23 0003 12/03/23 0737 12/04/23 0401 12/04/23 2052 12/05/23 0638  WBC 24.5*  23.0* 22.3*  --  16.6*  NEUTROABS 11.5*  --   --   --   --   HGB 8.5* 6.8* 6.6* 7.2* 6.4*  HCT 23.8* 19.7* 18.9* 20.8* 18.7*  MCV 96.0 96.6 95.9  --  96.4  PLT 215 210 196  --  190   Cardiac Enzymes: No results for input(s): CKTOTAL, CKMB, CKMBINDEX, TROPONINI in the last 168 hours. BNP (last 3 results) No results for input(s): BNP in the last 8760 hours.  ProBNP (last 3 results) No results for input(s): PROBNP in the last 8760 hours.  CBG: Recent Labs  Lab 12/05/23 2002 12/05/23 2348 12/06/23 0404 12/06/23 0759 12/06/23 1220  GLUCAP 149* 142* 113* 107* 138*    Recent Results (from the past 240 hours)  Culture, blood (Routine X 2) w Reflex to ID Panel     Status: None (Preliminary result)   Collection Time: 12/02/23  7:36 PM   Specimen: BLOOD LEFT ARM  Result Value Ref Range Status   Specimen Description   Final    BLOOD LEFT ARM Performed at [facility], [address] [place], [place] [address]    Special Requests   Final    BOTTLES DRAWN AEROBIC AND ANAEROBIC Blood Culture adequate volume Performed at [facility], [address] [place], [place] [address]    Culture   Final    NO GROWTH 4 DAYS Performed at [facility], [address] [place], [place] [address]    Report Status PENDING  Incomplete  Culture, blood (Routine X 2) w Reflex to ID Panel     Status: None (Preliminary result)   Collection Time: 12/02/23  7:39 PM   Specimen: BLOOD LEFT ARM  Result Value Ref Range Status   Specimen Description   Final    BLOOD LEFT ARM Performed at [facility], [address], [place], [place] [address]    Special Requests   Final    BOTTLES DRAWN AEROBIC AND ANAEROBIC Blood Culture adequate volume Performed at [facility], [address] [place], [place] [address]    Culture   Final    NO GROWTH 4 DAYS Performed at [facility], [address], [place], [place] [address]    Report  Status PENDING  Incomplete     Studies: DG CHEST PORT 1 VIEW Result Date: 12/02/2023 CLINICAL DATA:  141880 SOB (shortness of breath) 141880 EXAM: PORTABLE CHEST - 1 VIEW COMPARISON:  12/01/2023 FINDINGS: Stable left infrahilar scarring or atelectasis. Lungs otherwise clear. Heart size and mediastinal contours are within normal limits. No effusion. Visualized bones unremarkable. IMPRESSION: No acute cardiopulmonary disease. Electronically Signed   By: JONETTA Faes M.D.   On: 12/02/2023 17:45   DG Chest Port 1 View Result Date: 12/01/2023 CLINICAL DATA:  Sickle cell crisis EXAM: PORTABLE CHEST 1 VIEW COMPARISON:  08/26/2022 FINDINGS: Heart and mediastinal contours within normal limits. Increased markings in the lung bases are similar prior study and could reflect scarring  or atelectasis. No acute confluent opacities or effusions. No acute bony abnormality. IMPRESSION: Bibasilar scarring or atelectasis. No active disease. Electronically Signed   By: Franky Crease M.D.   On: 12/01/2023 23:49    Scheduled Meds:  azithromycin   500 mg Oral q1800   empagliflozin   10 mg Oral Daily   enoxaparin  (LOVENOX ) injection  40 mg Subcutaneous Q24H   folic acid   1 mg Oral Daily   gabapentin   300 mg Oral QHS   HYDROmorphone    Intravenous Q4H   ketorolac   15 mg Intravenous Q6H   lisinopril   2.5 mg Oral Daily   oxyCODONE   20 mg Oral Q12H   senna-docusate  1 tablet Oral BID   Continuous Infusions:  cefTRIAXone  (ROCEPHIN )  IV 2 g (12/05/23 1752)    Principal Problem:   Sickle cell pain crisis (HCC) Active Problems:   Controlled type 2 diabetes mellitus with hyperglycemia (HCC)   Other chronic pain   Leukocytosis   Ulcer of leg, chronic, right (HCC)

## 2023-12-07 DIAGNOSIS — D5700 Hb-SS disease with crisis, unspecified: Secondary | ICD-10-CM | POA: Diagnosis not present

## 2023-12-07 LAB — CULTURE, BLOOD (ROUTINE X 2)
Culture: NO GROWTH
Culture: NO GROWTH
Special Requests: ADEQUATE
Special Requests: ADEQUATE

## 2023-12-07 LAB — BASIC METABOLIC PANEL WITH GFR
Anion gap: 9 (ref 5–15)
BUN: 8 mg/dL (ref 6–20)
CO2: 24 mmol/L (ref 22–32)
Calcium: 8.6 mg/dL — ABNORMAL LOW (ref 8.9–10.3)
Chloride: 106 mmol/L (ref 98–111)
Creatinine, Ser: 0.56 mg/dL — ABNORMAL LOW (ref 0.61–1.24)
GFR, Estimated: >60 mL/min (ref >60)
Glucose, Bld: 133 mg/dL — ABNORMAL HIGH (ref 70–99)
Potassium: 3.8 mmol/L (ref 3.5–5.1)
Sodium: 139 mmol/L (ref 135–145)

## 2023-12-07 LAB — CBC WITH DIFFERENTIAL/PLATELET
Abs Immature Granulocytes: 0.37 10*3/uL — ABNORMAL HIGH (ref 0.00–0.07)
Basophils Absolute: 0.1 10*3/uL (ref 0.0–0.1)
Basophils Relative: 1 %
Eosinophils Absolute: 0.4 10*3/uL (ref 0.0–0.5)
Eosinophils Relative: 2 %
HCT: 21.4 % — ABNORMAL LOW (ref 39.0–52.0)
Hemoglobin: 7 g/dL — ABNORMAL LOW (ref 13.0–17.0)
Immature Granulocytes: 2 %
Lymphocytes Relative: 19 %
Lymphs Abs: 3 10*3/uL (ref 0.7–4.0)
MCH: 34.3 pg — ABNORMAL HIGH (ref 26.0–34.0)
MCHC: 32.7 g/dL (ref 30.0–36.0)
MCV: 104.9 fL — ABNORMAL HIGH (ref 80.0–100.0)
Monocytes Absolute: 3.2 10*3/uL — ABNORMAL HIGH (ref 0.1–1.0)
Monocytes Relative: 20 %
Neutro Abs: 8.9 10*3/uL — ABNORMAL HIGH (ref 1.7–7.7)
Neutrophils Relative %: 56 %
Platelets: 191 10*3/uL (ref 150–400)
RBC: 2.04 MIL/uL — ABNORMAL LOW (ref 4.22–5.81)
RDW: 26.2 % — ABNORMAL HIGH (ref 11.5–15.5)
WBC: 16 10*3/uL — ABNORMAL HIGH (ref 4.0–10.5)
nRBC: 56 % — ABNORMAL HIGH (ref 0.0–0.2)

## 2023-12-07 LAB — GLUCOSE, CAPILLARY
Glucose-Capillary: 111 mg/dL — ABNORMAL HIGH (ref 70–99)
Glucose-Capillary: 116 mg/dL — ABNORMAL HIGH (ref 70–99)
Glucose-Capillary: 125 mg/dL — ABNORMAL HIGH (ref 70–99)
Glucose-Capillary: 130 mg/dL — ABNORMAL HIGH (ref 70–99)
Glucose-Capillary: 146 mg/dL — ABNORMAL HIGH (ref 70–99)
Glucose-Capillary: 149 mg/dL — ABNORMAL HIGH (ref 70–99)
Glucose-Capillary: 170 mg/dL — ABNORMAL HIGH (ref 70–99)

## 2023-12-07 MED ORDER — OXYCODONE HCL 5 MG PO TABS
15.0000 mg | ORAL_TABLET | ORAL | Status: DC | PRN
  Administered 2023-12-07 – 2023-12-11 (×16): 15 mg via ORAL
  Filled 2023-12-07 (×16): qty 3

## 2023-12-07 MED ORDER — HYDROMORPHONE HCL 1 MG/ML IJ SOLN
2.0000 mg | INTRAMUSCULAR | Status: AC | PRN
  Administered 2023-12-07: 2 mg via INTRAVENOUS
  Administered 2023-12-07: 1 mg via INTRAVENOUS
  Administered 2023-12-07 – 2023-12-08 (×6): 2 mg via INTRAVENOUS
  Filled 2023-12-07 (×8): qty 2

## 2023-12-07 NOTE — Progress Notes (Signed)
 SICKLE CELL SERVICE PROGRESS NOTE  Michael Mullins FMW:[id number] DOB: Nov 03, 1980 DOA: 12/01/2023 PCP: Van Camie Norris, MD  Assessment/Plan: Principal Problem:   Sickle cell pain crisis [facility]) Active Problems:   Controlled type 2 diabetes mellitus with hyperglycemia (HCC)   Other chronic pain   Leukocytosis   Ulcer of leg, chronic, right (HCC)  Sickle cell pain crisis: Patient was doing better yesterday but now worse. Since last night pain has worsened not responding to the PCA. Will change PRN Dilaudid  to 2 mg q 2hrs prn with decreased frequency of the oral medication.  Still on Dilaudid  PCA but completed Toradol .  No fever no chills. Anemia of chronic disease: H&H is stable.  Continue to monitor Leukocytosis: Improved.  Secondary to vaso-occlusive crisis. Chronic pain syndrome: Continue chronic pain medications Controlled diabetes type 2: Continue with sliding scale insulin  Right leg ulcers: Continue wound care  Code Status: Full code Family Communication: No family at bedside Disposition Plan: Home when ready  [facility]  Pager [phone]. If 7PM-7AM, please contact night-coverage.  12/07/2023, 12:29 PM  LOS: 5 days   Brief narrative: Michael Mullins is a 43 y.o. male with medical history significant for sickle cell anemia and chronic pain, now presenting for evaluation of severe pain.   Patient reports that he woke yesterday morning with severe pain in his back, left arm, left leg, and right lateral ribs.  Symptoms are similar to his prior pain crises.  He is taking oxycodone  at home without relief.  Pain in the right ribs is worse if he touches it or lays on that side.  He denies shortness of breath, cough, fever, chills, abdominal pain, vomiting, diarrhea, or exertional chest pain.  Consultants: None  Procedures: Chest x-ray  Antibiotics: None  HPI/Subjective: Patient is doing better pain is 10 out of 10 appears to be stable.  No fever no chills no nausea  vomiting or diarrhea.  Pain is persistent.  Objective: Vitals:   12/07/23 0827 12/07/23 0900 12/07/23 1155 12/07/23 1215  BP:  137/88 (!) 152/97   Pulse:  76 92   Resp: 13 14 20 15   Temp:  98.4 F (36.9 C) 98.4 F (36.9 C)   TempSrc:  Oral Oral   SpO2:  97% 98%   Weight:      Height:       Weight change:   Intake/Output Summary (Last 24 hours) at 12/07/2023 1229 Last data filed at 12/07/2023 9167 Gross per 24 hour  Intake 840 ml  Output 2750 ml  Net -1910 ml    General: Alert, awake, oriented x3, in no acute distress.  HEENT: [place]/AT [place]ER[place], EOMI Neck: Trachea midline,  no masses, no thyromegal,y no JVD, no carotid bruit OROPHARYNX:  Moist, No exudate/ erythema/lesions.  Heart: Regular rate and rhythm, without murmurs, rubs, gallops, PMI non-displaced, no heaves or thrills on palpation.  Lungs: Clear to auscultation, no wheezing or rhonchi noted. No increased vocal fremitus resonant to percussion  Abdomen: Soft, nontender, nondistended, positive bowel sounds, no masses no hepatosplenomegaly noted..  Neuro: No focal neurological deficits noted cranial nerves II through XII grossly intact. DTRs 2+ bilaterally upper and lower extremities. Strength 5 out of 5 in bilateral upper and lower extremities. Musculoskeletal: No warm swelling or erythema around joints, no spinal tenderness noted. Psychiatric: Patient alert and oriented x3, good insight and cognition, good recent to remote recall. Lymph node survey: No cervical axillary or inguinal lymphadenopathy noted.   Data Reviewed: Basic Metabolic Panel: Recent Labs  Lab 12/02/23 0003 12/05/23 0407 12/07/23 1026  NA 139 138 139  K 4.1 4.9 3.8  CL 105 106 106  CO2 22 21* 24  GLUCOSE 129* 111* 133*  BUN 14 11 8   CREATININE 0.62 0.57* 0.56*  CALCIUM  9.4 8.4* 8.6*   Liver Function Tests: Recent Labs  Lab 12/02/23 0003 12/05/23 0407  AST 80* 102*  ALT 25 31  ALKPHOS 58 79  BILITOT 9.6* 9.0*  PROT 7.9 7.3  ALBUMIN 4.2  3.7   No results for input(s): LIPASE, AMYLASE in the last 168 hours. No results for input(s): AMMONIA in the last 168 hours. CBC: Recent Labs  Lab 12/02/23 0003 12/03/23 0737 12/04/23 0401 12/04/23 2052 12/05/23 0638 12/07/23 1026  WBC 24.5* 23.0* 22.3*  --  16.6* 16.0*  NEUTROABS 11.5*  --   --   --   --  8.9*  HGB 8.5* 6.8* 6.6* 7.2* 6.4* 7.0*  HCT 23.8* 19.7* 18.9* 20.8* 18.7* 21.4*  MCV 96.0 96.6 95.9  --  96.4 104.9*  PLT 215 210 196  --  190 191   Cardiac Enzymes: No results for input(s): CKTOTAL, CKMB, CKMBINDEX, TROPONINI in the last 168 hours. BNP (last 3 results) No results for input(s): BNP in the last 8760 hours.  ProBNP (last 3 results) No results for input(s): PROBNP in the last 8760 hours.  CBG: Recent Labs  Lab 12/06/23 2013 12/07/23 0005 12/07/23 0414 12/07/23 0731 12/07/23 1114  GLUCAP 123* 170* 111* 130* 116*    Recent Results (from the past 240 hours)  Culture, blood (Routine X 2) w Reflex to ID Panel     Status: None   Collection Time: 12/02/23  7:36 PM   Specimen: BLOOD LEFT ARM  Result Value Ref Range Status   Specimen Description   Final    BLOOD LEFT ARM Performed at [facility], [address] [place], [place] [address]    Special Requests   Final    BOTTLES DRAWN AEROBIC AND ANAEROBIC Blood Culture adequate volume Performed at [facility], [address], [place], [place] [address]    Culture   Final    NO GROWTH 5 DAYS Performed at [facility], [address] [place], [place] [address]    Report Status 12/07/2023 FINAL  Final  Culture, blood (Routine X 2) w Reflex to ID Panel     Status: None   Collection Time: 12/02/23  7:39 PM   Specimen: BLOOD LEFT ARM  Result Value Ref Range Status   Specimen Description   Final    BLOOD LEFT ARM Performed at [facility], [address], [place], [place] [address]    Special Requests   Final    BOTTLES DRAWN AEROBIC AND  ANAEROBIC Blood Culture adequate volume Performed at [facility], [address], [place], [place] [address]    Culture   Final    NO GROWTH 5 DAYS Performed at [facility], [address], [place], [place] [address]    Report Status 12/07/2023 FINAL  Final     Studies: DG CHEST PORT 1 VIEW Result Date: 12/02/2023 CLINICAL DATA:  141880 SOB (shortness of breath) 141880 EXAM: PORTABLE CHEST - 1 VIEW COMPARISON:  12/01/2023 FINDINGS: Stable left infrahilar scarring or atelectasis. Lungs otherwise clear. Heart size and mediastinal contours are within normal limits. No effusion. Visualized bones unremarkable. IMPRESSION: No acute cardiopulmonary disease. Electronically Signed   By: JONETTA Johann HERO.D.  On: 12/02/2023 17:45   DG Chest Port 1 View Result Date: 12/01/2023 CLINICAL DATA:  Sickle cell crisis EXAM: PORTABLE CHEST 1 VIEW COMPARISON:  08/26/2022 FINDINGS: Heart and mediastinal contours within normal limits. Increased markings in the lung bases are similar prior study and could reflect scarring or atelectasis. No acute confluent opacities or effusions. No acute bony abnormality. IMPRESSION: Bibasilar scarring or atelectasis. No active disease. Electronically Signed   By: Franky Crease M.D.   On: 12/01/2023 23:49    Scheduled Meds:  empagliflozin   10 mg Oral Daily   enoxaparin  (LOVENOX ) injection  40 mg Subcutaneous Q24H   folic acid   1 mg Oral Daily   gabapentin   300 mg Oral QHS   HYDROmorphone    Intravenous Q4H   lisinopril   2.5 mg Oral Daily   oxyCODONE   20 mg Oral Q12H   senna-docusate  1 tablet Oral BID   Continuous Infusions:    Principal Problem:   Sickle cell pain crisis (HCC) Active Problems:   Controlled type 2 diabetes mellitus with hyperglycemia (HCC)   Other chronic pain   Leukocytosis   Ulcer of leg, chronic, right (HCC)

## 2023-12-08 ENCOUNTER — Other Ambulatory Visit: Payer: Self-pay

## 2023-12-08 ENCOUNTER — Other Ambulatory Visit (HOSPITAL_COMMUNITY): Payer: Self-pay

## 2023-12-08 LAB — BPAM RBC
Blood Product Expiration Date: 202507192359
Blood Product Expiration Date: 202507232359
ISSUE DATE / TIME: 202506261416
ISSUE DATE / TIME: 202506271037
Unit Type and Rh: 6200
Unit Type and Rh: 6200

## 2023-12-08 LAB — GLUCOSE, CAPILLARY
Glucose-Capillary: 123 mg/dL — ABNORMAL HIGH (ref 70–99)
Glucose-Capillary: 120 mg/dL — ABNORMAL HIGH (ref 70–99)
Glucose-Capillary: 100 mg/dL — ABNORMAL HIGH (ref 70–99)
Glucose-Capillary: 139 mg/dL — ABNORMAL HIGH (ref 70–99)
Glucose-Capillary: 165 mg/dL — ABNORMAL HIGH (ref 70–99)

## 2023-12-08 LAB — TYPE AND SCREEN
ABO/RH(D): A POS
Donor AG Type: NEGATIVE
Donor AG Type: NEGATIVE
Unit division: 0
Unit division: 0

## 2023-12-08 NOTE — Progress Notes (Signed)
 Patient ID: Michael Mullins, male   DOB: April 20, 1981, 43 y.o.   MRN: [id number] Subjective: Michael Mullins is a 43 year old male with a medical history significant for sickle cell disease, opiate dependence and tolerance, hypertension, type 2 diabetes, and obesity was admitted for back pain, left arm, left leg, and right lateral rib pain in the setting of sickle cell pain crisis. Patient continues to report pain of 8/10 today. Patient has no new concerns. Wound dressing on right leg is clean no discharge, no s/s of infection. Patient denies shortness of breath, dizziness, blurry vision, urinary symptoms, nausea, vomiting, or diarrhea.    Objective:  Vital signs in last 24 hours:  Vitals:   12/08/23 0749 12/08/23 1003 12/08/23 1136 12/08/23 1138  BP:  (!) 140/76    Pulse:  (!) 105    Resp: 14 20 12 14   Temp:  98.6 F (37 C)    TempSrc:  Oral    SpO2: 100% 98% 100%   Weight:      Height:        Intake/Output from previous day:   Intake/Output Summary (Last 24 hours) at 12/08/2023 1152 Last data filed at 12/08/2023 0952 Gross per 24 hour  Intake 1040 ml  Output 3975 ml  Net -2935 ml    Physical Exam: General: Alert, awake, oriented x3, in no acute distress.  HEENT: [place]/AT [place]ER[place], EOMI Neck: Trachea midline,  no masses, no thyromegal,y no JVD, no carotid bruit OROPHARYNX:  Moist, No exudate/ erythema/lesions.  Heart: Regular rate and rhythm, without murmurs, rubs, gallops, PMI non-displaced, no heaves or thrills on palpation.  Lungs: Clear to auscultation, no wheezing or rhonchi noted. No increased vocal fremitus resonant to percussion  Abdomen: Soft, nontender, nondistended, positive bowel sounds, no masses no hepatosplenomegaly noted..  Neuro: No focal neurological deficits noted cranial nerves II through XII grossly intact. DTRs 2+ bilaterally upper and lower extremities. Strength 5 out of 5 in bilateral upper and lower extremities. Musculoskeletal:, Left arm, left leg and right  lateral rib tenderness.   Psychiatric: Patient alert and oriented x3, good insight and cognition, good recent to remote recall. Lymph node survey: No cervical axillary or inguinal lymphadenopathy noted.  Lab Results:  Basic Metabolic Panel:    Component Value Date/Time   NA 139 12/07/2023 1026   K 3.8 12/07/2023 1026   CL 106 12/07/2023 1026   CO2 24 12/07/2023 1026   BUN 8 12/07/2023 1026   CREATININE 0.56 (L) 12/07/2023 1026   GLUCOSE 133 (H) 12/07/2023 1026   CALCIUM  8.6 (L) 12/07/2023 1026   CBC:    Component Value Date/Time   WBC 16.0 (H) 12/07/2023 1026   HGB 7.0 (L) 12/07/2023 1026   HCT 21.4 (L) 12/07/2023 1026   PLT 191 12/07/2023 1026   MCV 104.9 (H) 12/07/2023 1026   NEUTROABS 8.9 (H) 12/07/2023 1026   LYMPHSABS 3.0 12/07/2023 1026   MONOABS 3.2 (H) 12/07/2023 1026   EOSABS 0.4 12/07/2023 1026   BASOSABS 0.1 12/07/2023 1026    Recent Results (from the past 240 hours)  Culture, blood (Routine X 2) w Reflex to ID Panel     Status: None   Collection Time: 12/02/23  7:36 PM   Specimen: BLOOD LEFT ARM  Result Value Ref Range Status   Specimen Description   Final    BLOOD LEFT ARM Performed at [facility], [address] [place], [place] [address]    Special Requests   Final    BOTTLES DRAWN  AEROBIC AND ANAEROBIC Blood Culture adequate volume Performed at [facility], [address], [place], [place] [address]    Culture   Final    NO GROWTH 5 DAYS Performed at [facility], [address] [place], [place] [address]    Report Status 12/07/2023 FINAL  Final  Culture, blood (Routine X 2) w Reflex to ID Panel     Status: None   Collection Time: 12/02/23  7:39 PM   Specimen: BLOOD LEFT ARM  Result Value Ref Range Status   Specimen Description   Final    BLOOD LEFT ARM Performed at [facility], [address], [place], [place] [address]    Special Requests   Final    BOTTLES DRAWN AEROBIC AND ANAEROBIC Blood Culture  adequate volume Performed at [facility], [address] [place], [place] [address]    Culture   Final    NO GROWTH 5 DAYS Performed at [facility], [address] [place], [place] [address]    Report Status 12/07/2023 FINAL  Final    Studies/Results: No results found.   Medications: Scheduled Meds:  empagliflozin   10 mg Oral Daily   enoxaparin  (LOVENOX ) injection  40 mg Subcutaneous Q24H   folic acid   1 mg Oral Daily   gabapentin   300 mg Oral QHS   HYDROmorphone    Intravenous Q4H   lisinopril   2.5 mg Oral Daily   oxyCODONE   20 mg Oral Q12H   senna-docusate  1 tablet Oral BID   Continuous Infusions:   PRN Meds:.acetaminophen , diphenhydrAMINE , HYDROmorphone  (DILAUDID ) injection, naloxone  **AND** sodium chloride  flush, ondansetron  (ZOFRAN ) IV, oxyCODONE , polyethylene glycol, trimethobenzamide   Consultants: Wound care  Procedures: None  Antibiotics:         None   Assessment/Plan: Principal Problem:   Sickle cell pain crisis (HCC) Active Problems:   Controlled type 2 diabetes mellitus with hyperglycemia (HCC)   Other chronic pain   Leukocytosis   Ulcer of leg, chronic, right (HCC)   Hb Sickle Cell Disease with Pain crisis: Continue IVF 0.45% Saline KVO, continue weight based Dilaudid  PCA, IV Toradol  15 mg Q 6 H for a total of 5 days (completed), continue oral home pain medications as ordered. Monitor vitals very closely, Re-evaluate pain scale regularly, 2 L of Oxygen  by [place]. Patient encouraged to ambulate on the hallway today. Leukocytosis: Elevated at 16.0 gradually improving from 3 days prior. Fever resolved. Repeat chest x-ray 12/02/23: shows no acute cardiopulmonary disease. Will continue to monitor daily CBC  Anemia of Chronic Disease: Hemoglobin 7.0 g/dL after PRBC transfusion . Will continue to monitor daily CBC Chronic pain Syndrome: Continue oral home pain medication. Controlled type 2 diabetes mellitus with hyperglycemia:  Stable, CBG monitor q 4,  Continue Jardiance  as prescribed. Ulcer of leg, chronic, right [facility]): Clean wound dressing, no s/s of infection. Continue wound care orders.      Code Status: Full Code Family Communication: N/A Disposition Plan: Not yet ready for discharge  Homer CHRISTELLA Cover NP  If 7PM-7AM, please contact night-coverage.  12/08/2023, 11:52 AM  LOS: 6 days

## 2023-12-08 NOTE — Progress Notes (Signed)
 Patient continues to rate pain 10/10. Patient observed grimacing and moaning. Wife at bedside reports ongoing complaints of severe pain throughout the day. PCA pump in use with 37 demands, 12 delivered noted at 2000. Scheduled and PRN medications administered with minimal relief. SVT sustained at 150-170's with chest pain 10/10. EKG shows sinus tachycardia. On call provider and rapid respond notified. Charge at bedside.

## 2023-12-08 NOTE — Progress Notes (Signed)
 Gave Tylenol  and PRN Oxycodone .   12/08/23 2223  Assess: MEWS Score  Temp 99.9 F (37.7 C)  BP (!) 146/61  MAP (mmHg) 87  Pulse Rate (!) 132  Resp 15  SpO2 97 %  O2 Device Nasal Cannula  O2 Flow Rate (L/min) 4 L/min  Assess: MEWS Score  MEWS Temp 0  MEWS Systolic 0  MEWS Pulse 3  MEWS RR 0  MEWS LOC 0  MEWS Score 3  MEWS Score Color Yellow  Assess: if the MEWS score is Yellow or Red  Were vital signs accurate and taken at a resting state? Yes  Does the patient meet 2 or more of the SIRS criteria? No  Does the patient have a confirmed or suspected source of infection? No  MEWS guidelines implemented  Yes, yellow  Treat  MEWS Interventions Considered administering scheduled or prn medications/treatments as ordered  Take Vital Signs  Increase Vital Sign Frequency  Yellow: Q2hr x1, continue Q4hrs until patient remains green for 12hrs  Escalate  MEWS: Escalate Yellow: Discuss with charge nurse and consider notifying provider and/or RRT  Notify: Charge Nurse/RN  Name of Charge Nurse/RN Notified Rotunda Worden RN  Provider Notification  Provider Name/Title Lynwood Kipper, NP  Date Provider Notified 12/08/23  Time Provider Notified 2226  Method of Notification Page (secure unit)  Notification Reason Other (Comment) (elevated HR and fever)  Provider response No new orders  Date of Provider Response 12/08/23  Time of Provider Response 2245  Assess: SIRS CRITERIA  SIRS Temperature  0  SIRS Respirations  0  SIRS Pulse 1  SIRS WBC 0  SIRS Score Sum  1

## 2023-12-09 ENCOUNTER — Inpatient Hospital Stay (HOSPITAL_COMMUNITY)

## 2023-12-09 LAB — URINALYSIS, COMPLETE (UACMP) WITH MICROSCOPIC
Bacteria, UA: NONE SEEN
Bilirubin Urine: NEGATIVE
Glucose, UA: >=500 mg/dL — AB
Hgb urine dipstick: NEGATIVE
Ketones, ur: NEGATIVE mg/dL
Leukocytes,Ua: NEGATIVE
Nitrite: NEGATIVE
Protein, ur: NEGATIVE mg/dL
Specific Gravity, Urine: 1.015 (ref 1.005–1.030)
pH: 6 (ref 5.0–8.0)

## 2023-12-09 LAB — CBC WITH DIFFERENTIAL/PLATELET
Abs Immature Granulocytes: 0.22 10*3/uL — ABNORMAL HIGH (ref 0.00–0.07)
Basophils Absolute: 0.1 10*3/uL (ref 0.0–0.1)
Basophils Relative: 0 %
Eosinophils Absolute: 0.1 10*3/uL (ref 0.0–0.5)
Eosinophils Relative: 0 %
HCT: 23.4 % — ABNORMAL LOW (ref 39.0–52.0)
Hemoglobin: 7.6 g/dL — ABNORMAL LOW (ref 13.0–17.0)
Immature Granulocytes: 1 %
Lymphocytes Relative: 13 %
Lymphs Abs: 3.3 10*3/uL (ref 0.7–4.0)
MCH: 32.8 pg (ref 26.0–34.0)
MCHC: 32.5 g/dL (ref 30.0–36.0)
MCV: 100.9 fL — ABNORMAL HIGH (ref 80.0–100.0)
Monocytes Absolute: 5.1 10*3/uL — ABNORMAL HIGH (ref 0.1–1.0)
Monocytes Relative: 20 %
Neutro Abs: 16.5 10*3/uL — ABNORMAL HIGH (ref 1.7–7.7)
Neutrophils Relative %: 66 %
Platelets: 251 10*3/uL (ref 150–400)
RBC: 2.32 MIL/uL — ABNORMAL LOW (ref 4.22–5.81)
RDW: 21.7 % — ABNORMAL HIGH (ref 11.5–15.5)
WBC: 25.2 10*3/uL — ABNORMAL HIGH (ref 4.0–10.5)
nRBC: 10.2 % — ABNORMAL HIGH (ref 0.0–0.2)

## 2023-12-09 LAB — COMPREHENSIVE METABOLIC PANEL WITH GFR
ALT: 23 U/L (ref 0–44)
AST: 46 U/L — ABNORMAL HIGH (ref 15–41)
Albumin: 3.7 g/dL (ref 3.5–5.0)
Alkaline Phosphatase: 133 U/L — ABNORMAL HIGH (ref 38–126)
Anion gap: 11 (ref 5–15)
BUN: 8 mg/dL (ref 6–20)
CO2: 26 mmol/L (ref 22–32)
Calcium: 9.2 mg/dL (ref 8.9–10.3)
Chloride: 100 mmol/L (ref 98–111)
Creatinine, Ser: 0.55 mg/dL — ABNORMAL LOW (ref 0.61–1.24)
GFR, Estimated: >60 mL/min (ref >60)
Glucose, Bld: 184 mg/dL — ABNORMAL HIGH (ref 70–99)
Potassium: 3.5 mmol/L (ref 3.5–5.1)
Sodium: 137 mmol/L (ref 135–145)
Total Bilirubin: 5 mg/dL — ABNORMAL HIGH (ref 0.0–1.2)
Total Protein: 8.3 g/dL — ABNORMAL HIGH (ref 6.5–8.1)

## 2023-12-09 LAB — GLUCOSE, CAPILLARY
Glucose-Capillary: 126 mg/dL — ABNORMAL HIGH (ref 70–99)
Glucose-Capillary: 128 mg/dL — ABNORMAL HIGH (ref 70–99)
Glucose-Capillary: 144 mg/dL — ABNORMAL HIGH (ref 70–99)
Glucose-Capillary: 146 mg/dL — ABNORMAL HIGH (ref 70–99)
Glucose-Capillary: 175 mg/dL — ABNORMAL HIGH (ref 70–99)
Glucose-Capillary: 182 mg/dL — ABNORMAL HIGH (ref 70–99)
Glucose-Capillary: 196 mg/dL — ABNORMAL HIGH (ref 70–99)

## 2023-12-09 LAB — C-REACTIVE PROTEIN: CRP: 14.5 mg/dL — ABNORMAL HIGH (ref <1.0)

## 2023-12-09 LAB — LACTATE DEHYDROGENASE: LDH: 970 U/L — ABNORMAL HIGH (ref 98–192)

## 2023-12-09 LAB — LACTIC ACID, PLASMA
Lactic Acid, Venous: 1.4 mmol/L (ref 0.5–1.9)
Lactic Acid, Venous: 0.8 mmol/L (ref 0.5–1.9)

## 2023-12-09 LAB — CREATININE, SERUM
Creatinine, Ser: 0.67 mg/dL (ref 0.61–1.24)
GFR, Estimated: >60 mL/min (ref >60)

## 2023-12-09 MED ORDER — HYDROMORPHONE HCL 2 MG/ML IJ SOLN
2.0000 mg | Freq: Once | INTRAMUSCULAR | Status: AC
  Administered 2023-12-09: 2 mg via INTRAVENOUS
  Filled 2023-12-09: qty 1

## 2023-12-09 MED ORDER — HYDROMORPHONE 1 MG/ML IV SOLN
INTRAVENOUS | Status: DC
  Administered 2023-12-09: 2 mg via INTRAVENOUS
  Administered 2023-12-09: 10.8 mg via INTRAVENOUS
  Administered 2023-12-09: 4.2 mg via INTRAVENOUS
  Administered 2023-12-09: 30 mg via INTRAVENOUS
  Administered 2023-12-10: 8.4 mg via INTRAVENOUS
  Administered 2023-12-10: 2 mg via INTRAVENOUS
  Administered 2023-12-10: 7.2 mg via INTRAVENOUS
  Administered 2023-12-10: 30 mg via INTRAVENOUS
  Administered 2023-12-10: 5.6 mg via INTRAVENOUS
  Administered 2023-12-10: 30 mg via INTRAVENOUS
  Administered 2023-12-10: 6.8 mg via INTRAVENOUS
  Administered 2023-12-10: 10.4 mg via INTRAVENOUS
  Administered 2023-12-11: 7.2 mg via INTRAVENOUS
  Administered 2023-12-11: 7.4 mg via INTRAVENOUS
  Administered 2023-12-11: 3.6 mg via INTRAVENOUS
  Administered 2023-12-11: 4.8 mg via INTRAVENOUS
  Administered 2023-12-11: 5.6 mg via INTRAVENOUS
  Administered 2023-12-11: 2 mg via INTRAVENOUS
  Filled 2023-12-09 (×3): qty 30

## 2023-12-09 MED ORDER — IOHEXOL 350 MG/ML SOLN
75.0000 mL | Freq: Once | INTRAVENOUS | Status: AC | PRN
  Administered 2023-12-09: 75 mL via INTRAVENOUS

## 2023-12-09 MED ORDER — SODIUM CHLORIDE 0.9 % IV SOLN
2.0000 g | Freq: Three times a day (TID) | INTRAVENOUS | Status: DC
  Administered 2023-12-09 – 2023-12-13 (×12): 2 g via INTRAVENOUS
  Filled 2023-12-09 (×12): qty 12.5

## 2023-12-09 MED ORDER — SODIUM CHLORIDE (PF) 0.9 % IJ SOLN
INTRAMUSCULAR | Status: AC
  Filled 2023-12-09: qty 50

## 2023-12-09 NOTE — Progress Notes (Signed)
 Patient ID: Michael Mullins, male   DOB: 12/13/1980, 43 y.o.   MRN: [id number] Subjective: Michael Mullins is a 43 year old male with a medical history significant for sickle cell disease, opiate dependence and tolerance, hypertension, type 2 diabetes, and obesity was admitted for back pain, left arm, left leg, and right lateral rib pain in the setting of sickle cell pain crisis.  Patient continues to report worsening pain of 9/10 today. Patient has no new concerns. Wound dressing on right leg is clean no discharge, no s/s of infection. Patient denies shortness of breath, dizziness, blurry vision, urinary symptoms, nausea, vomiting, or diarrhea.    Objective:  Vital signs in last 24 hours:  Vitals:   12/09/23 0745 12/09/23 0921 12/09/23 0926 12/09/23 1121  BP: (!) 149/73 (!) 149/73    Pulse: (!) 108     Resp: 16  16 12   Temp: 98.4 F (36.9 C)     TempSrc: Oral     SpO2: 97%     Weight:      Height:        Intake/Output from previous day:   Intake/Output Summary (Last 24 hours) at 12/09/2023 1204 Last data filed at 12/09/2023 1037 Gross per 24 hour  Intake 240 ml  Output 3750 ml  Net -3510 ml    Physical Exam: General: Alert, awake, oriented x3, in no acute distress.  HEENT: [place]/AT PEERL, EOMI Neck: Trachea midline,  no masses, no thyromegal,y no JVD, no carotid bruit OROPHARYNX:  Moist, No exudate/ erythema/lesions.  Heart: Regular rate and rhythm, without murmurs, rubs, gallops, PMI non-displaced, no heaves or thrills on palpation.  Lungs: Clear to auscultation, no wheezing or rhonchi noted. No increased vocal fremitus resonant to percussion  Abdomen: Soft, nontender, nondistended, positive bowel sounds, no masses no hepatosplenomegaly noted..  Neuro: No focal neurological deficits noted cranial nerves II through XII grossly intact. DTRs 2+ bilaterally upper and lower extremities. Strength 5 out of 5 in bilateral upper and lower extremities. Musculoskeletal:, Left arm, left leg and  right lateral rib tenderness.   Psychiatric: Patient alert and oriented x3, good insight and cognition, good recent to remote recall. Lymph node survey: No cervical axillary or inguinal lymphadenopathy noted.  Lab Results:  Basic Metabolic Panel:    Component Value Date/Time   NA 137 12/09/2023 0949   K 3.5 12/09/2023 0949   CL 100 12/09/2023 0949   CO2 26 12/09/2023 0949   BUN 8 12/09/2023 0949   CREATININE 0.55 (L) 12/09/2023 0949   GLUCOSE 184 (H) 12/09/2023 0949   CALCIUM  9.2 12/09/2023 0949   CBC:    Component Value Date/Time   WBC 16.0 (H) 12/07/2023 1026   HGB 7.0 (L) 12/07/2023 1026   HCT 21.4 (L) 12/07/2023 1026   PLT 191 12/07/2023 1026   MCV 104.9 (H) 12/07/2023 1026   NEUTROABS 8.9 (H) 12/07/2023 1026   LYMPHSABS 3.0 12/07/2023 1026   MONOABS 3.2 (H) 12/07/2023 1026   EOSABS 0.4 12/07/2023 1026   BASOSABS 0.1 12/07/2023 1026    Recent Results (from the past 240 hours)  Culture, blood (Routine X 2) w Reflex to ID Panel     Status: None   Collection Time: 12/02/23  7:36 PM   Specimen: BLOOD LEFT ARM  Result Value Ref Range Status   Specimen Description   Final    BLOOD LEFT ARM Performed at [facility], [address] [place], [place] [address]    Special Requests   Final  BOTTLES DRAWN AEROBIC AND ANAEROBIC Blood Culture adequate volume Performed at [facility], [address], [place], [place] [address]    Culture   Final    NO GROWTH 5 DAYS Performed at [facility], [address], [place], [place] [address]    Report Status 12/07/2023 FINAL  Final  Culture, blood (Routine X 2) w Reflex to ID Panel     Status: None   Collection Time: 12/02/23  7:39 PM   Specimen: BLOOD LEFT ARM  Result Value Ref Range Status   Specimen Description   Final    BLOOD LEFT ARM Performed at [facility], [address] [place], [place] [address]    Special Requests   Final    BOTTLES DRAWN AEROBIC AND ANAEROBIC Blood  Culture adequate volume Performed at [facility], [address] [place], [place] [address]    Culture   Final    NO GROWTH 5 DAYS Performed at [facility], [address] [place], [place] [address]    Report Status 12/07/2023 FINAL  Final    Studies/Results: No results found.   Medications: Scheduled Meds:  empagliflozin   10 mg Oral Daily   enoxaparin  (LOVENOX ) injection  40 mg Subcutaneous Q24H   folic acid   1 mg Oral Daily   gabapentin   300 mg Oral QHS   HYDROmorphone    Intravenous Q4H   lisinopril   2.5 mg Oral Daily   oxyCODONE   20 mg Oral Q12H   senna-docusate  1 tablet Oral BID   Continuous Infusions:   PRN Meds:.acetaminophen , diphenhydrAMINE , naloxone  **AND** sodium chloride  flush, ondansetron  (ZOFRAN ) IV, oxyCODONE , polyethylene glycol, trimethobenzamide   Consultants: Wound care  Procedures: None  Antibiotics:         None   Assessment/Plan: Principal Problem:   Sickle cell pain crisis (HCC) Active Problems:   Controlled type 2 diabetes mellitus with hyperglycemia (HCC)   Other chronic pain   Leukocytosis   Ulcer of leg, chronic, right (HCC)   Hb Sickle Cell Disease with Pain crisis: Continue IVF 0.45% Saline KVO, continue weight based Dilaudid  PCA, IV Toradol  15 mg Q 6 H for a total of 5 days (completed), continue oral home pain medications as ordered. Monitor vitals very closely, Re-evaluate pain scale regularly, 2 L of Oxygen  by [place]. Patient encouraged to ambulate on the hallway today.  Leukocytosis: Elevated at 16.0 gradually improving from 3 days prior. Fever resolved. Repeat chest x-ray 12/02/23: shows no acute cardiopulmonary disease. Will continue to monitor daily CBC  Anemia of Chronic Disease: Hemoglobin 7.0 g/dL after PRBC transfusion . Will continue to monitor daily CBC Chronic pain Syndrome: Continue oral home pain medication. Controlled type 2 diabetes mellitus with hyperglycemia: Stable, CBG monitor q 4,   Continue Jardiance  as prescribed. Ulcer of leg, chronic, right [facility]): Clean wound dressing, no s/s of infection. Continue wound care orders.      Code Status: Full Code Family Communication: N/A Disposition Plan: Not yet ready for discharge  Homer CHRISTELLA Cover NP  If 7PM-7AM, please contact night-coverage.  12/09/2023, 12:04 PM  LOS: 7 days

## 2023-12-09 NOTE — Plan of Care (Signed)
  Problem: Education: Goal: Awareness of infection prevention will improve Outcome: Progressing Goal: Long-term complications will improve Outcome: Progressing   Problem: Self-Care: Goal: Ability to incorporate actions that prevent/reduce pain crisis will improve Outcome: Progressing

## 2023-12-10 LAB — CBC
HCT: 23.4 % — ABNORMAL LOW (ref 39.0–52.0)
Hemoglobin: 7.5 g/dL — ABNORMAL LOW (ref 13.0–17.0)
MCH: 31.8 pg (ref 26.0–34.0)
MCHC: 32.1 g/dL (ref 30.0–36.0)
MCV: 99.2 fL (ref 80.0–100.0)
Platelets: 276 10*3/uL (ref 150–400)
RBC: 2.36 MIL/uL — ABNORMAL LOW (ref 4.22–5.81)
RDW: 21.4 % — ABNORMAL HIGH (ref 11.5–15.5)
WBC: 23.2 10*3/uL — ABNORMAL HIGH (ref 4.0–10.5)
nRBC: 9.8 % — ABNORMAL HIGH (ref 0.0–0.2)

## 2023-12-10 LAB — GLUCOSE, CAPILLARY
Glucose-Capillary: 126 mg/dL — ABNORMAL HIGH (ref 70–99)
Glucose-Capillary: 167 mg/dL — ABNORMAL HIGH (ref 70–99)
Glucose-Capillary: 143 mg/dL — ABNORMAL HIGH (ref 70–99)
Glucose-Capillary: 158 mg/dL — ABNORMAL HIGH (ref 70–99)
Glucose-Capillary: 185 mg/dL — ABNORMAL HIGH (ref 70–99)

## 2023-12-10 NOTE — Progress Notes (Addendum)
 Patient ID: Michael Mullins, male   DOB: 1981/03/13, 43 y.o.   MRN: [id number] Subjective: [name] is a 43 year old male with a medical history significant for sickle cell disease, opiate dependence and tolerance, hypertension, type 2 diabetes, and obesity was admitted for back pain, left arm, left leg, and right lateral rib pain in the setting of sickle cell pain crisis.  Patient reports slight improvement of pain 7-8/10 today after adjustments to PCA pump and adding on boluses every 4 hours.. Patient has no new concerns. Patient denies shortness of breath, dizziness, blurry vision, urinary symptoms, nausea, vomiting, or diarrhea.    Objective:  Vital signs in last 24 hours:  Vitals:   12/10/23 0646 12/10/23 0731 12/10/23 1026 12/10/23 1121  BP:   128/68   Pulse:   (!) 113   Resp: 16 14 19 14   Temp:   99.1 F (37.3 C)   TempSrc:   Oral   SpO2: 97% 96% 98% 96%  Weight:      Height:        Intake/Output from previous day:   Intake/Output Summary (Last 24 hours) at 12/10/2023 1341 Last data filed at 12/10/2023 0930 Gross per 24 hour  Intake 340 ml  Output 2850 ml  Net -2510 ml    Physical Exam: General: Alert, awake, oriented x3, in no acute distress.  HEENT: [place]/AT PEERL, EOMI Neck: Trachea midline,  no masses, no thyromegal,y no JVD, no carotid bruit OROPHARYNX:  Moist, No exudate/ erythema/lesions.  Heart: Regular rate and rhythm, without murmurs, rubs, gallops, PMI non-displaced, no heaves or thrills on palpation.  Lungs: Clear to auscultation, no wheezing or rhonchi noted. No increased vocal fremitus resonant to percussion  Abdomen: Soft, nontender, nondistended, positive bowel sounds, no masses no hepatosplenomegaly noted..  Neuro: No focal neurological deficits noted cranial nerves II through XII grossly intact. DTRs 2+ bilaterally upper and lower extremities. Strength 5 out of 5 in bilateral upper and lower extremities. Musculoskeletal:, Left arm, left leg and right  lateral rib tenderness.   Psychiatric: Patient alert and oriented x3, good insight and cognition, good recent to remote recall. Lymph node survey: No cervical axillary or inguinal lymphadenopathy noted.  Lab Results:  Basic Metabolic Panel:    Component Value Date/Time   NA 137 12/09/2023 0949   K 3.5 12/09/2023 0949   CL 100 12/09/2023 0949   CO2 26 12/09/2023 0949   BUN 8 12/09/2023 0949   CREATININE 0.55 (L) 12/09/2023 0949   GLUCOSE 184 (H) 12/09/2023 0949   CALCIUM  9.2 12/09/2023 0949   CBC:    Component Value Date/Time   WBC 23.2 (H) 12/10/2023 0618   HGB 7.5 (L) 12/10/2023 0618   HCT 23.4 (L) 12/10/2023 0618   PLT 276 12/10/2023 0618   MCV 99.2 12/10/2023 0618   NEUTROABS 16.5 (H) 12/09/2023 0949   LYMPHSABS 3.3 12/09/2023 0949   MONOABS 5.1 (H) 12/09/2023 0949   EOSABS 0.1 12/09/2023 0949   BASOSABS 0.1 12/09/2023 0949    Recent Results (from the past 240 hours)  Culture, blood (Routine X 2) w Reflex to ID Panel     Status: None   Collection Time: 12/02/23  7:36 PM   Specimen: BLOOD LEFT ARM  Result Value Ref Range Status   Specimen Description   Final    BLOOD LEFT ARM Performed at [facility], [address] [place], [place] [address]    Special Requests   Final    BOTTLES DRAWN AEROBIC AND ANAEROBIC  Blood Culture adequate volume Performed at [facility], [address] [place], [place] [address]    Culture   Final    NO GROWTH 5 DAYS Performed at [facility], [address], [place], [place] [address]    Report Status 12/07/2023 FINAL  Final  Culture, blood (Routine X 2) w Reflex to ID Panel     Status: None   Collection Time: 12/02/23  7:39 PM   Specimen: BLOOD LEFT ARM  Result Value Ref Range Status   Specimen Description   Final    BLOOD LEFT ARM Performed at [facility], [address], [place], [place] [address]    Special Requests   Final    BOTTLES DRAWN AEROBIC AND ANAEROBIC Blood Culture  adequate volume Performed at [facility], [address], [place], [place] [address]    Culture   Final    NO GROWTH 5 DAYS Performed at [facility], [address] [place], [place] [address]    Report Status 12/07/2023 FINAL  Final  Culture, blood (Routine X 2) w Reflex to ID Panel     Status: None (Preliminary result)   Collection Time: 12/09/23  7:32 PM   Specimen: BLOOD  Result Value Ref Range Status   Specimen Description   Final    BLOOD BLOOD RIGHT HAND Performed at [facility], [address], [place], [place] [address]    Special Requests   Final    BOTTLES DRAWN AEROBIC ONLY Blood Culture results may not be optimal due to an inadequate volume of blood received in culture bottles Performed at [facility], [address] [place], [place] [address]    Culture   Final    NO GROWTH < 12 HOURS Performed at [facility], [address], [place], [place] [address]    Report Status PENDING  Incomplete  Culture, blood (Routine X 2) w Reflex to ID Panel     Status: None (Preliminary result)   Collection Time: 12/09/23  7:37 PM   Specimen: BLOOD  Result Value Ref Range Status   Specimen Description   Final    BLOOD BLOOD LEFT ARM Performed at [facility], [address], [place], [place] [address]    Special Requests   Final    BOTTLES DRAWN AEROBIC ONLY Blood Culture adequate volume Performed at [facility], [address], [place], [place] [address]    Culture   Final    NO GROWTH < 12 HOURS Performed at [facility], [address], [place], [place] [address]    Report Status PENDING  Incomplete    Studies/Results: CT Angio Chest Pulmonary Embolism (PE) W or WO Contrast Result Date: 12/09/2023 CLINICAL DATA:  Chest pain. Sickle cell crisis. Clinical concern for pulmonary embolism. EXAM: CT ANGIOGRAPHY CHEST WITH CONTRAST TECHNIQUE: Multidetector CT  imaging of the chest was performed using the standard protocol during bolus administration of intravenous contrast. Multiplanar CT image reconstructions and MIPs were obtained to evaluate the vascular anatomy. RADIATION DOSE REDUCTION: This exam was performed according to the departmental dose-optimization program which includes automated exposure control, adjustment of the mA and/or kV according to patient size and/or use of iterative reconstruction technique. CONTRAST:  75mL OMNIPAQUE  IOHEXOL  350 MG/ML SOLN COMPARISON:  05/30/2021 FINDINGS: Cardiovascular: Stable mildly enlarged heart. No pericardial effusion. Normally opacified pulmonary arteries with no pulmonary arterial filling defects seen. Mediastinum/Nodes: No  enlarged mediastinal, hilar, or axillary lymph nodes. Thyroid gland, trachea, and esophagus demonstrate no significant findings. Lungs/Pleura: A 4 mm mean diameter superior segment right lower lobe nodule on image number 55/10 is unchanged. Several additional smaller right lung nodules are unchanged. None of these nodules need imaging follow-up. Mild bilateral lower lobe atelectasis. Very minimal left pleural effusion. Upper Abdomen: Unremarkable. Musculoskeletal: Stable diffuse bone sclerosis compatible with the history of sickle cell disease. Minimal thoracic spine degenerative changes. Review of the MIP images confirms the above findings. IMPRESSION: 1. No pulmonary embolism. 2. Mild bilateral lower lobe atelectasis. 3. Very minimal left pleural effusion. 4. Stable mild cardiomegaly. 5. Stable diffuse bone sclerosis compatible with the history of sickle cell disease. Electronically Signed   By: Elspeth Bathe M.D.   On: 12/09/2023 17:19     Medications: Scheduled Meds:  empagliflozin   10 mg Oral Daily   enoxaparin  (LOVENOX ) injection  40 mg Subcutaneous Q24H   folic acid   1 mg Oral Daily   gabapentin   300 mg Oral QHS   HYDROmorphone    Intravenous Q4H   lisinopril   2.5 mg Oral Daily    oxyCODONE   20 mg Oral Q12H   senna-docusate  1 tablet Oral BID   Continuous Infusions:  ceFEPime  (MAXIPIME ) IV 2 g (12/10/23 0938)    PRN Meds:.acetaminophen , diphenhydrAMINE , naloxone  **AND** sodium chloride  flush, ondansetron  (ZOFRAN ) IV, oxyCODONE , polyethylene glycol, trimethobenzamide   Consultants: Wound care  Procedures: None  Antibiotics:       Cefepime    Assessment/Plan: Principal Problem:   Sickle cell pain crisis (HCC) Active Problems:   Controlled type 2 diabetes mellitus with hyperglycemia (HCC)   Other chronic pain   Leukocytosis   Ulcer of leg, chronic, right (HCC)   Hb Sickle Cell Disease with Pain crisis: although pain is slightly improved since increasing PCA setting patient still reporting significant pain rated at 7-8/10 today. Goal is to do an exchange transfusion if pain is not under control by the end of the day today. Continue IVF 0.45% Saline KVO, continue weight based Dilaudid  PCA, IV Toradol  15 mg Q 6 H for a total of 5 days (completed), continue oral home pain medications as ordered. Monitor vitals very closely, Re-evaluate pain scale regularly, 2 L of Oxygen  by [place]. Patient encouraged to ambulate on the hallway today.  Leukocytosis: Elevated at 23.2, on and off low grade fever up to 101*F. Wound dressing on right leg is clean no discharge, no s/s of infection, Repeat chest x-ray 12/02/23: shows no acute cardiopulmonary disease.  Blood cultures not growing any organism as of today, CT angiogram chest : IMPRESSION: . No pulmonary embolism. . Mild bilateral lower lobe atelectasis. . Very minimal left pleural effusion. . Stable mild cardiomegaly. . Stable diffuse bone sclerosis compatible with the history of   sickle cell disease.will continue to monitor daily CBC  Patient on cefepime  IV.  Will continue to monitor daily CBC. Anemia of Chronic Disease: Hemoglobin 7. 5 g/dL after PRBC transfusion. Will continue to monitor daily CBC Chronic pain Syndrome:  Continue oral home pain medication. Controlled type 2 diabetes mellitus with hyperglycemia: Stable, CBG monitor q 4,  Continue Jardiance  as prescribed. Ulcer of leg, chronic, right [facility]): Clean wound dressing, no s/s of infection. Continue wound care orders.      Code Status: Full Code Family Communication: N/A Disposition Plan: Not yet ready for discharge  Homer CHRISTELLA Cover NP  If 7PM-7AM, please contact night-coverage.  12/10/2023, 1:41 PM  LOS: 8 days

## 2023-12-10 NOTE — Progress Notes (Signed)
   12/09/23 2356  Assess: MEWS Score  Temp (!) 101.9 F (38.8 C)  BP (!) 141/72  MAP (mmHg) 88  Pulse Rate (!) 128  Resp 16  SpO2 96 %  O2 Device Nasal Cannula  Assess: MEWS Score  MEWS Temp 2  MEWS Systolic 0  MEWS Pulse 2  MEWS RR 0  MEWS LOC 0  MEWS Score 4  MEWS Score Color Red  Assess: if the MEWS score is Yellow or Red  Were vital signs accurate and taken at a resting state? Yes  Does the patient meet 2 or more of the SIRS criteria? Yes  Does the patient have a confirmed or suspected source of infection? Yes  MEWS guidelines implemented  Yes, red  Treat  MEWS Interventions Considered administering scheduled or prn medications/treatments as ordered  Take Vital Signs  Increase Vital Sign Frequency  Red: Q1hr x2, continue Q4hrs until patient remains green for 12hrs  Escalate  MEWS: Escalate Red: Discuss with charge nurse and notify provider. Consider notifying RRT. If remains red for 2 hours consider need for higher level of care  Notify: Charge Nurse/RN  Name of Charge Nurse/RN Notified Mayhan, Renita  Provider Notification  Provider Name/Title J.Blondie, NP  Date Provider Notified 12/10/23  Time Provider Notified [phone]  Method of Notification Page (secure chat)  Notification Reason Change in status (Rew mews HR 128, temp 101.9)  Provider response No new orders  Date of Provider Response 12/10/23  Time of Provider Response 0020  Notify: Rapid Response  Name of Rapid Response RN Notified [place]  Date Rapid Response Notified 12/10/23  Time Rapid Response Notified 0045  Assess: SIRS CRITERIA  SIRS Temperature  1  SIRS Respirations  0  SIRS Pulse 1  SIRS WBC 0  SIRS Score Sum  2

## 2023-12-10 NOTE — Plan of Care (Signed)

## 2023-12-10 NOTE — Plan of Care (Signed)
   Problem: Health Behavior/Discharge Planning: Goal: Ability to manage health-related needs will improve Outcome: Progressing   Problem: Clinical Measurements: Goal: Ability to maintain clinical measurements within normal limits will improve Outcome: Progressing Goal: Will remain free from infection Outcome: Progressing

## 2023-12-11 ENCOUNTER — Inpatient Hospital Stay (HOSPITAL_COMMUNITY)

## 2023-12-11 DIAGNOSIS — R609 Edema, unspecified: Secondary | ICD-10-CM

## 2023-12-11 LAB — CBC
HCT: 21.3 % — ABNORMAL LOW (ref 39.0–52.0)
Hemoglobin: 6.8 g/dL — CL (ref 13.0–17.0)
MCH: 31.1 pg (ref 26.0–34.0)
MCHC: 31.9 g/dL (ref 30.0–36.0)
MCV: 97.3 fL (ref 80.0–100.0)
Platelets: 373 10*3/uL (ref 150–400)
RBC: 2.19 MIL/uL — ABNORMAL LOW (ref 4.22–5.81)
RDW: 22.5 % — ABNORMAL HIGH (ref 11.5–15.5)
WBC: 19.3 10*3/uL — ABNORMAL HIGH (ref 4.0–10.5)
nRBC: 11.4 % — ABNORMAL HIGH (ref 0.0–0.2)

## 2023-12-11 LAB — LACTATE DEHYDROGENASE: LDH: 600 U/L — ABNORMAL HIGH (ref 98–192)

## 2023-12-11 LAB — COMPREHENSIVE METABOLIC PANEL WITH GFR
ALT: 22 U/L (ref 0–44)
AST: 31 U/L (ref 15–41)
Albumin: 3.3 g/dL — ABNORMAL LOW (ref 3.5–5.0)
Alkaline Phosphatase: 123 U/L (ref 38–126)
Anion gap: 8 (ref 5–15)
BUN: 9 mg/dL (ref 6–20)
CO2: 24 mmol/L (ref 22–32)
Calcium: 8.6 mg/dL — ABNORMAL LOW (ref 8.9–10.3)
Chloride: 103 mmol/L (ref 98–111)
Creatinine, Ser: 0.68 mg/dL (ref 0.61–1.24)
GFR, Estimated: >60 mL/min (ref >60)
Glucose, Bld: 210 mg/dL — ABNORMAL HIGH (ref 70–99)
Potassium: 3.3 mmol/L — ABNORMAL LOW (ref 3.5–5.1)
Sodium: 135 mmol/L (ref 135–145)
Total Bilirubin: 4.2 mg/dL — ABNORMAL HIGH (ref 0.0–1.2)
Total Protein: 8.2 g/dL — ABNORMAL HIGH (ref 6.5–8.1)

## 2023-12-11 LAB — GLUCOSE, CAPILLARY
Glucose-Capillary: 109 mg/dL — ABNORMAL HIGH (ref 70–99)
Glucose-Capillary: 123 mg/dL — ABNORMAL HIGH (ref 70–99)
Glucose-Capillary: 132 mg/dL — ABNORMAL HIGH (ref 70–99)
Glucose-Capillary: 149 mg/dL — ABNORMAL HIGH (ref 70–99)
Glucose-Capillary: 151 mg/dL — ABNORMAL HIGH (ref 70–99)
Glucose-Capillary: 186 mg/dL — ABNORMAL HIGH (ref 70–99)
Glucose-Capillary: 197 mg/dL — ABNORMAL HIGH (ref 70–99)

## 2023-12-11 MED ORDER — SODIUM CHLORIDE 0.9% IV SOLUTION: Freq: Once | INTRAVENOUS | Status: DC

## 2023-12-11 MED ORDER — HYDROMORPHONE 1 MG/ML IV SOLN
INTRAVENOUS | Status: DC
  Administered 2023-12-11: 30 mg via INTRAVENOUS
  Administered 2023-12-12: 6.6 mg via INTRAVENOUS
  Administered 2023-12-12: 6.2 mg via INTRAVENOUS
  Administered 2023-12-12: 0.6 mg via INTRAVENOUS
  Administered 2023-12-12: 2 mg via INTRAVENOUS
  Administered 2023-12-12: 30 mg via INTRAVENOUS
  Administered 2023-12-12: 1.8 mg via INTRAVENOUS
  Administered 2023-12-12: 7.8 mg via INTRAVENOUS
  Administered 2023-12-13: 4.8 mg via INTRAVENOUS
  Filled 2023-12-11 (×3): qty 30

## 2023-12-11 MED ORDER — OXYCODONE HCL 5 MG PO TABS
30.0000 mg | ORAL_TABLET | ORAL | Status: DC | PRN
  Administered 2023-12-11 – 2023-12-12 (×5): 30 mg via ORAL
  Filled 2023-12-11 (×5): qty 6

## 2023-12-11 MED ORDER — ACETAMINOPHEN 325 MG PO TABS: 650.0000 mg | ORAL_TABLET | Freq: Once | ORAL | Status: DC

## 2023-12-11 MED ORDER — DIPHENHYDRAMINE HCL 25 MG PO CAPS: 25.0000 mg | ORAL_CAPSULE | Freq: Once | ORAL | Status: DC

## 2023-12-11 NOTE — Plan of Care (Signed)
   Problem: Health Behavior/Discharge Planning: Goal: Ability to manage health-related needs will improve Outcome: Progressing   Problem: Clinical Measurements: Goal: Ability to maintain clinical measurements within normal limits will improve Outcome: Progressing Goal: Will remain free from infection Outcome: Progressing

## 2023-12-11 NOTE — Inpatient Diabetes Management (Signed)
 Inpatient Diabetes Program Recommendations  AACE/ADA: New Consensus Statement on Inpatient Glycemic Control (2015)  Target Ranges:  Prepandial:   less than 140 mg/dL      Peak postprandial:   less than 180 mg/dL (1-2 hours)      Critically ill patients:  140 - 180 mg/dL    Latest Reference Range & Units 12/09/23 23:58 12/10/23 05:33 12/10/23 07:22 12/10/23 11:57 12/10/23 16:21 12/10/23 20:16  Glucose-Capillary 70 - 99 mg/dL 855 (H) 873 (H) 832 (H) 143 (H) 158 (H) 185 (H)  (H): Data is abnormally high  Latest Reference Range & Units 12/11/23 00:06 12/11/23 04:48 12/11/23 07:29  Glucose-Capillary 70 - 99 mg/dL 867 (H) 850 (H) 813 (H)  (H): Data is abnormally high   Admit with: Sickle cell anemia with pain crisis; chronic pain   History: DM2  Home DM Meds: Jardiance  10 mg daily       Freestyle Libre 3 plus CGM  Current Orders: Jardiance  10 mg daily    Checking CBGs Q4H CBGs stable so far  Will follow    --Will follow patient during hospitalization--  Adina Rudolpho Arrow RN, MSN, CDCES Diabetes Coordinator Inpatient Glycemic Control Team Team Pager: [phone] (8a-5p)

## 2023-12-11 NOTE — Progress Notes (Addendum)
 Patient ID: Michael Mullins, male   DOB: 25-Aug-1980, 43 y.o.   MRN: [id number] Subjective: Michael Mullins is a 43 year old male with a medical history significant for sickle cell disease, opiate dependence and tolerance, hypertension, type 2 diabetes, and obesity was admitted for back pain, left arm, left leg, and right lateral rib pain in the setting of sickle cell pain crisis.  Patient patient continues to report pain of 8/10 today after adjustments to PCA pump and adding on boluses every 4 hours. Patient has no new concerns. Patient denies shortness of breath, dizziness, blurry vision, urinary symptoms, nausea, vomiting, or diarrhea.    Objective:  Vital signs in last 24 hours:  Vitals:   12/11/23 1047 12/11/23 1134 12/11/23 1136 12/11/23 1349  BP: 119/68   134/75  Pulse: (!) 103   (!) 118  Resp: 18 15 15 14   Temp: 98.4 F (36.9 C)   99.8 F (37.7 C)  TempSrc: Oral   Oral  SpO2: 93% 94% 94% 92%  Weight:      Height:        Intake/Output from previous day:   Intake/Output Summary (Last 24 hours) at 12/11/2023 1536 Last data filed at 12/11/2023 1050 Gross per 24 hour  Intake 706.4 ml  Output 3100 ml  Net -2393.6 ml    Physical Exam: General: Alert, awake, oriented x3, in no acute distress.  HEENT: [place]/AT PEERL, EOMI Neck: Trachea midline,  no masses, no thyromegal,y no JVD, no carotid bruit OROPHARYNX:  Moist, No exudate/ erythema/lesions.  Heart: Regular rate and rhythm, without murmurs, rubs, gallops, PMI non-displaced, no heaves or thrills on palpation.  Lungs: Clear to auscultation, no wheezing or rhonchi noted. No increased vocal fremitus resonant to percussion  Abdomen: Soft, nontender, nondistended, positive bowel sounds, no masses no hepatosplenomegaly noted..  Neuro: No focal neurological deficits noted cranial nerves II through XII grossly intact. DTRs 2+ bilaterally upper and lower extremities. Strength 5 out of 5 in bilateral upper and lower  extremities. Musculoskeletal:, Left arm, left leg and right lateral rib tenderness.   Psychiatric: Patient alert and oriented x3, good insight and cognition, good recent to remote recall. Lymph node survey: No cervical axillary or inguinal lymphadenopathy noted.  Lab Results:  Basic Metabolic Panel:    Component Value Date/Time   NA 135 12/11/2023 0552   K 3.3 (L) 12/11/2023 0552   CL 103 12/11/2023 0552   CO2 24 12/11/2023 0552   BUN 9 12/11/2023 0552   CREATININE 0.68 12/11/2023 0552   GLUCOSE 210 (H) 12/11/2023 0552   CALCIUM  8.6 (L) 12/11/2023 0552   CBC:    Component Value Date/Time   WBC 19.3 (H) 12/11/2023 0552   HGB 6.8 (LL) 12/11/2023 0552   HCT 21.3 (L) 12/11/2023 0552   PLT 373 12/11/2023 0552   MCV 97.3 12/11/2023 0552   NEUTROABS 16.5 (H) 12/09/2023 0949   LYMPHSABS 3.3 12/09/2023 0949   MONOABS 5.1 (H) 12/09/2023 0949   EOSABS 0.1 12/09/2023 0949   BASOSABS 0.1 12/09/2023 0949    Recent Results (from the past 240 hours)  Culture, blood (Routine X 2) w Reflex to ID Panel     Status: None   Collection Time: 12/02/23  7:36 PM   Specimen: BLOOD LEFT ARM  Result Value Ref Range Status   Specimen Description   Final    BLOOD LEFT ARM Performed at [facility], [address], [place], [place] [address]    Special Requests   Final  BOTTLES DRAWN AEROBIC AND ANAEROBIC Blood Culture adequate volume Performed at [facility], [address] [place], [place] [address]    Culture   Final    NO GROWTH 5 DAYS Performed at [facility], [address] [place], [place] [address]    Report Status 12/07/2023 FINAL  Final  Culture, blood (Routine X 2) w Reflex to ID Panel     Status: None   Collection Time: 12/02/23  7:39 PM   Specimen: BLOOD LEFT ARM  Result Value Ref Range Status   Specimen Description   Final    BLOOD LEFT ARM Performed at [facility], [address] [place], [place] [address]    Special Requests    Final    BOTTLES DRAWN AEROBIC AND ANAEROBIC Blood Culture adequate volume Performed at [facility], [address], [place], [place] [address]    Culture   Final    NO GROWTH 5 DAYS Performed at [facility], [address] [place], [place] [address]    Report Status 12/07/2023 FINAL  Final  Culture, blood (Routine X 2) w Reflex to ID Panel     Status: None (Preliminary result)   Collection Time: 12/09/23  7:32 PM   Specimen: BLOOD  Result Value Ref Range Status   Specimen Description   Final    BLOOD BLOOD RIGHT HAND Performed at [facility], [address] [place], [place] [address]    Special Requests   Final    BOTTLES DRAWN AEROBIC ONLY Blood Culture results may not be optimal due to an inadequate volume of blood received in culture bottles Performed at [facility], [address], [place], [place] [address]    Culture   Final    NO GROWTH 2 DAYS Performed at [facility], [address] [place], [place] [address]    Report Status PENDING  Incomplete  Culture, blood (Routine X 2) w Reflex to ID Panel     Status: None (Preliminary result)   Collection Time: 12/09/23  7:37 PM   Specimen: BLOOD  Result Value Ref Range Status   Specimen Description   Final    BLOOD BLOOD LEFT ARM Performed at [facility], [address], [place], [place] [address]    Special Requests   Final    BOTTLES DRAWN AEROBIC ONLY Blood Culture adequate volume Performed at [facility], [address] [place], [place] [address]    Culture   Final    NO GROWTH 2 DAYS Performed at [facility], [address], [place], [place] [address]    Report Status PENDING  Incomplete    Studies/Results: CT Angio Chest Pulmonary Embolism (PE) W or WO Contrast Result Date: 12/09/2023 CLINICAL DATA:  Chest pain. Sickle cell crisis. Clinical concern for pulmonary embolism. EXAM: CT ANGIOGRAPHY  CHEST WITH CONTRAST TECHNIQUE: Multidetector CT imaging of the chest was performed using the standard protocol during bolus administration of intravenous contrast. Multiplanar CT image reconstructions and MIPs were obtained to evaluate the vascular anatomy. RADIATION DOSE REDUCTION: This exam was performed according to the departmental dose-optimization program which includes automated exposure control, adjustment of the mA and/or kV according to patient size and/or use of iterative reconstruction technique. CONTRAST:  75mL OMNIPAQUE  IOHEXOL  350 MG/ML SOLN COMPARISON:  05/30/2021 FINDINGS: Cardiovascular: Stable mildly enlarged heart. No pericardial effusion. Normally opacified pulmonary arteries with no pulmonary arterial filling defects  seen. Mediastinum/Nodes: No enlarged mediastinal, hilar, or axillary lymph nodes. Thyroid gland, trachea, and esophagus demonstrate no significant findings. Lungs/Pleura: A 4 mm mean diameter superior segment right lower lobe nodule on image number 55/10 is unchanged. Several additional smaller right lung nodules are unchanged. None of these nodules need imaging follow-up. Mild bilateral lower lobe atelectasis. Very minimal left pleural effusion. Upper Abdomen: Unremarkable. Musculoskeletal: Stable diffuse bone sclerosis compatible with the history of sickle cell disease. Minimal thoracic spine degenerative changes. Review of the MIP images confirms the above findings. IMPRESSION: 1. No pulmonary embolism. 2. Mild bilateral lower lobe atelectasis. 3. Very minimal left pleural effusion. 4. Stable mild cardiomegaly. 5. Stable diffuse bone sclerosis compatible with the history of sickle cell disease. Electronically Signed   By: Elspeth Bathe M.D.   On: 12/09/2023 17:19     Medications: Scheduled Meds:  sodium chloride    Intravenous Once   acetaminophen   650 mg Oral Once   diphenhydrAMINE   25 mg Oral Once   empagliflozin   10 mg Oral Daily   enoxaparin  (LOVENOX ) injection  40  mg Subcutaneous Q24H   folic acid   1 mg Oral Daily   gabapentin   300 mg Oral QHS   HYDROmorphone    Intravenous Q4H   lisinopril   2.5 mg Oral Daily   oxyCODONE   20 mg Oral Q12H   senna-docusate  1 tablet Oral BID   Continuous Infusions:  ceFEPime  (MAXIPIME ) IV 2 g (12/11/23 1009)    PRN Meds:.acetaminophen , diphenhydrAMINE , naloxone  **AND** sodium chloride  flush, ondansetron  (ZOFRAN ) IV, oxyCODONE , polyethylene glycol, trimethobenzamide   Consultants: Wound care  Procedures: Blood transfusion  Antibiotics:       Cefepime    Assessment/Plan: Principal Problem:   Sickle cell pain crisis (HCC) Active Problems:   Controlled type 2 diabetes mellitus with hyperglycemia (HCC)   Other chronic pain   Leukocytosis   Ulcer of leg, chronic, right (HCC)   Hb Sickle Cell Disease with Pain crisis: pain has not improving since increasing PCA setting. Patient is still reporting significant pain rated at 8/10 today. Patient will have exchange transfusion of 350 cc removed and transfused 2 units. Continue IVF 0.45% Saline KVO, weight based Dilaudid  PCA, Toradol  15 mg Q 6 H for 5 days completed.  Continue oral home pain medications as ordered. Monitor vitals very closely, Re-evaluate pain scale regularly, 2 L of Oxygen  by [place]. Patient encouraged to ambulate on the hallway today.  Leukocytosis: Elevated at 19.3, on and off low grade fever up to 102*F. Wound dressing on right leg is clean no discharge, no s/s of infection, Repeat chest x-ray 12/02/23: shows no acute cardiopulmonary disease.  Blood cultures not growing any organism as of today, CT angiogram chest : Negative.  Will continue to monitor daily CBC. Patient on cefepime  IV.  Will continue to monitor daily CBC. Anemia of Chronic Disease: Hemoglobin 6.8 g/dL after PRBC transfusion.  Will continue to monitor daily CBC Chronic pain Syndrome: Continue oral home pain medication. Controlled type 2 diabetes mellitus with hyperglycemia: Stable, CBG  monitor q 4,  Continue Jardiance  as prescribed. Ulcer of leg, chronic, right [facility]): Clean wound dressing, no s/s of infection. Continue wound care orders.      Code Status: Full Code Family Communication: N/A Disposition Plan: Not yet ready for discharge  Homer CHRISTELLA Cover NP  If 7PM-7AM, please contact night-coverage.  12/11/2023, 3:36 PM  LOS: 9 days

## 2023-12-11 NOTE — Progress Notes (Signed)
 VASCULAR LAB    Bilateral lower extremity venous duplex has been performed.  See CV proc for preliminary results.   Gibson Lad, RVT 12/11/2023, 4:59 PM

## 2023-12-12 LAB — CBC
HCT: 23.9 % — ABNORMAL LOW (ref 39.0–52.0)
Hemoglobin: 7.7 g/dL — ABNORMAL LOW (ref 13.0–17.0)
MCH: 30.4 pg (ref 26.0–34.0)
MCHC: 32.2 g/dL (ref 30.0–36.0)
MCV: 94.5 fL (ref 80.0–100.0)
Platelets: 414 K/uL — ABNORMAL HIGH (ref 150–400)
RBC: 2.53 MIL/uL — ABNORMAL LOW (ref 4.22–5.81)
RDW: 22.7 % — ABNORMAL HIGH (ref 11.5–15.5)
WBC: 15.2 K/uL — ABNORMAL HIGH (ref 4.0–10.5)
nRBC: 11.5 % — ABNORMAL HIGH (ref 0.0–0.2)

## 2023-12-12 LAB — GLUCOSE, CAPILLARY
Glucose-Capillary: 204 mg/dL — ABNORMAL HIGH (ref 70–99)
Glucose-Capillary: 125 mg/dL — ABNORMAL HIGH (ref 70–99)
Glucose-Capillary: 152 mg/dL — ABNORMAL HIGH (ref 70–99)
Glucose-Capillary: 115 mg/dL — ABNORMAL HIGH (ref 70–99)
Glucose-Capillary: 164 mg/dL — ABNORMAL HIGH (ref 70–99)
Glucose-Capillary: 132 mg/dL — ABNORMAL HIGH (ref 70–99)

## 2023-12-12 MED ORDER — SODIUM CHLORIDE 0.9% FLUSH: 10.0000 mL | INTRAVENOUS | Status: DC | PRN

## 2023-12-12 NOTE — Progress Notes (Signed)
 Patient ID: Michael Mullins, male   DOB: 1981/04/17, 43 y.o.   MRN: [id number] Subjective: Michael Mullins is a 43 year old male with a medical history significant for sickle cell disease, opiate dependence and tolerance, hypertension, type 2 diabetes, and obesity was admitted for back pain, left arm, left leg, and right lateral rib pain in the setting of sickle cell pain crisis. Patient is reporting improved pain of 3/10 today after adjustments to PCA pump and adding on boluses every 3 hours. Patient has no new concerns. Patient denies shortness of breath, dizziness, blurry vision, urinary symptoms, nausea, vomiting, or diarrhea.    Objective:  Vital signs in last 24 hours:  Vitals:   12/12/23 0600 12/12/23 0805 12/12/23 1103 12/12/23 1215  BP: 117/82  (!) 161/65   Pulse: (!) 115  96   Resp: 13 16 14 14   Temp: 98.6 F (37 C)  98.3 F (36.8 C)   TempSrc: Oral  Oral   SpO2: 96% 96% 97% 97%  Weight:      Height:        Intake/Output from previous day:   Intake/Output Summary (Last 24 hours) at 12/12/2023 1325 Last data filed at 12/12/2023 1100 Gross per 24 hour  Intake 1220.6 ml  Output 3575 ml  Net -2354.4 ml    Physical Exam: General: Alert, awake, oriented x3, in no acute distress.  HEENT: [place]/AT PEERL, EOMI Neck: Trachea midline,  no masses, no thyromegal,y no JVD, no carotid bruit OROPHARYNX:  Moist, No exudate/ erythema/lesions.  Heart: Regular rate and rhythm, without murmurs, rubs, gallops, PMI non-displaced, no heaves or thrills on palpation.  Lungs: Clear to auscultation, no wheezing or rhonchi noted. No increased vocal fremitus resonant to percussion  Abdomen: Soft, nontender, nondistended, positive bowel sounds, no masses no hepatosplenomegaly noted..  Neuro: No focal neurological deficits noted cranial nerves II through XII grossly intact. DTRs 2+ bilaterally upper and lower extremities. Strength 5 out of 5 in bilateral upper and lower extremities. Musculoskeletal:, Left  arm, left leg and right lateral rib tenderness.   Psychiatric: Patient alert and oriented x3, good insight and cognition, good recent to remote recall. Lymph node survey: No cervical axillary or inguinal lymphadenopathy noted.  Lab Results:  Basic Metabolic Panel:    Component Value Date/Time   NA 135 12/11/2023 0552   K 3.3 (L) 12/11/2023 0552   CL 103 12/11/2023 0552   CO2 24 12/11/2023 0552   BUN 9 12/11/2023 0552   CREATININE 0.68 12/11/2023 0552   GLUCOSE 210 (H) 12/11/2023 0552   CALCIUM  8.6 (L) 12/11/2023 0552   CBC:    Component Value Date/Time   WBC 15.2 (H) 12/12/2023 1138   HGB 7.7 (L) 12/12/2023 1138   HCT 23.9 (L) 12/12/2023 1138   PLT 414 (H) 12/12/2023 1138   MCV 94.5 12/12/2023 1138   NEUTROABS 16.5 (H) 12/09/2023 0949   LYMPHSABS 3.3 12/09/2023 0949   MONOABS 5.1 (H) 12/09/2023 0949   EOSABS 0.1 12/09/2023 0949   BASOSABS 0.1 12/09/2023 0949    Recent Results (from the past 240 hours)  Culture, blood (Routine X 2) w Reflex to ID Panel     Status: None   Collection Time: 12/02/23  7:36 PM   Specimen: BLOOD LEFT ARM  Result Value Ref Range Status   Specimen Description   Final    BLOOD LEFT ARM Performed at [facility], [address], [place], [place] [address]    Special Requests   Final  BOTTLES DRAWN AEROBIC AND ANAEROBIC Blood Culture adequate volume Performed at [facility], [address], [place], [place] [address]    Culture   Final    NO GROWTH 5 DAYS Performed at [facility], [address], [place], [place] [address]    Report Status 12/07/2023 FINAL  Final  Culture, blood (Routine X 2) w Reflex to ID Panel     Status: None   Collection Time: 12/02/23  7:39 PM   Specimen: BLOOD LEFT ARM  Result Value Ref Range Status   Specimen Description   Final    BLOOD LEFT ARM Performed at [facility], [address], [place], [place] [address]    Special Requests   Final    BOTTLES DRAWN AEROBIC AND  ANAEROBIC Blood Culture adequate volume Performed at [facility], [address], [place], [place] [address]    Culture   Final    NO GROWTH 5 DAYS Performed at [facility], [address] [place], [place] [address]    Report Status 12/07/2023 FINAL  Final  Culture, blood (Routine X 2) w Reflex to ID Panel     Status: None (Preliminary result)   Collection Time: 12/09/23  7:32 PM   Specimen: BLOOD  Result Value Ref Range Status   Specimen Description   Final    BLOOD BLOOD RIGHT HAND Performed at [facility], [address] [place], [place] [address]    Special Requests   Final    BOTTLES DRAWN AEROBIC ONLY Blood Culture results may not be optimal due to an inadequate volume of blood received in culture bottles Performed at [facility], [address], [place], [place] [address]    Culture   Final    NO GROWTH 3 DAYS Performed at [facility], [address] [place], [place] [address]    Report Status PENDING  Incomplete  Culture, blood (Routine X 2) w Reflex to ID Panel     Status: None (Preliminary result)   Collection Time: 12/09/23  7:37 PM   Specimen: BLOOD  Result Value Ref Range Status   Specimen Description   Final    BLOOD BLOOD LEFT ARM Performed at [facility], [address], [place], [place] [address]    Special Requests   Final    BOTTLES DRAWN AEROBIC ONLY Blood Culture adequate volume Performed at [facility], [address] [place], [place] [address]    Culture   Final    NO GROWTH 3 DAYS Performed at [facility], [address], [place], [place] [address]    Report Status PENDING  Incomplete    Studies/Results: VAS US  UPPER EXTREMITY VENOUS DUPLEX Result Date: 12/11/2023 UPPER VENOUS STUDY  Patient Name:  Michael Mullins  Date of Exam:   12/11/2023 Medical Rec #: [id number]       Accession #:    [id number] Date of Birth: 05-29-1981        Patient Gender: M Patient Age:   6 years Exam Location:  [facility] Procedure:      VAS US  UPPER EXTREMITY VENOUS DUPLEX Referring Phys: HOMER COVER --------------------------------------------------------------------------------  Indications: Pain, Swelling, and Sickle Cell Crisis Limitations: Poor ultrasound/tissue interface and poor scanning environment (constant interuptions from staff). Comparison Study: No prior study on file Performing Technologist: Alberta Lis RVS  Examination Guidelines: A complete evaluation includes B-mode imaging, spectral Doppler, color  Doppler, and power Doppler as needed of all accessible portions of each vessel. Bilateral testing is considered an integral part of a complete examination. Limited examinations for reoccurring indications may be performed as noted.  Right Findings: +----------+------------+---------+-----------+----------+--------------------+ RIGHT     CompressiblePhasicitySpontaneousProperties      Summary        +----------+------------+---------+-----------+----------+--------------------+ IJV           Full       Yes       Yes                                   +----------+------------+---------+-----------+----------+--------------------+ Subclavian               Yes       Yes                   patent by                                                              color/doppler     +----------+------------+---------+-----------+----------+--------------------+ Axillary                 Yes       Yes                   patent by                                                              color/doppler     +----------+------------+---------+-----------+----------+--------------------+ Brachial      Full                                                       +----------+------------+---------+-----------+----------+--------------------+ Radial        Full                                                        +----------+------------+---------+-----------+----------+--------------------+ Ulnar                    Yes       Yes              not well visualized  +----------+------------+---------+-----------+----------+--------------------+ Cephalic      Full                                                       +----------+------------+---------+-----------+----------+--------------------+ Basilic                  Yes  Yes                                   +----------+------------+---------+-----------+----------+--------------------+ Only one of paired ulner veins visualized - patent by color/doppler  Left Findings: +----------+------------+---------+-----------+----------+--------------------+ LEFT      CompressiblePhasicitySpontaneousProperties      Summary        +----------+------------+---------+-----------+----------+--------------------+ IJV           Full       Yes       Yes                                   +----------+------------+---------+-----------+----------+--------------------+ Subclavian               Yes       Yes                   patent by                                                              color/doppler     +----------+------------+---------+-----------+----------+--------------------+ Axillary                 Yes       Yes                   patent by                                                              color/doppler     +----------+------------+---------+-----------+----------+--------------------+ Brachial      Full                                                       +----------+------------+---------+-----------+----------+--------------------+ Radial        Full                                                       +----------+------------+---------+-----------+----------+--------------------+ Ulnar         Full                                                        +----------+------------+---------+-----------+----------+--------------------+ Cephalic      Full                                                       +----------+------------+---------+-----------+----------+--------------------+  Basilic       Full                                  not well visualized  +----------+------------+---------+-----------+----------+--------------------+  Summary: No evidence of deep vein or superficial vein thrombosis involving the right and left upper extremities, however some portions of this exam was limited due to above stated limitations.  *See table(s) above for measurements and observations.  Diagnosing physician: Norman Serve Electronically signed by Norman Serve on 12/11/2023 at 6:30:17 PM.    Final      Medications: Scheduled Meds:  empagliflozin   10 mg Oral Daily   enoxaparin  (LOVENOX ) injection  40 mg Subcutaneous Q24H   folic acid   1 mg Oral Daily   gabapentin   300 mg Oral QHS   HYDROmorphone    Intravenous Q4H   lisinopril   2.5 mg Oral Daily   oxyCODONE   20 mg Oral Q12H   senna-docusate  1 tablet Oral BID   Continuous Infusions:  ceFEPime  (MAXIPIME ) IV 2 g (12/12/23 0952)    PRN Meds:.acetaminophen , diphenhydrAMINE , naloxone  **AND** sodium chloride  flush, ondansetron  (ZOFRAN ) IV, oxyCODONE , polyethylene glycol, trimethobenzamide   Consultants: Wound care PT/OT  Procedures: Blood transfusion  Antibiotics:       Cefepime    Assessment/Plan: Principal Problem:   Sickle cell pain crisis (HCC) Active Problems:   Controlled type 2 diabetes mellitus with hyperglycemia (HCC)   Other chronic pain   Leukocytosis   Ulcer of leg, chronic, right (HCC)   Hb Sickle Cell Disease with Pain crisis: pain has improved since increasing PCA doses with boluses every 3 hours. Pain is 3/10 today.  Patient will have exchange transfusion of 350 cc removed and transfused 2 units. Blood transfusion pending.  Continue IVF 0.45% Saline KVO, weight  based Dilaudid  PCA, Toradol  15 mg Q 6 H for 5 days completed.  Continue oral home pain medications as ordered. Monitor vitals very closely, Re-evaluate pain scale regularly, 2 L of Oxygen  by [place]. Patient encouraged to ambulate on the hallway today. Leukocytosis: Elevated at 15.2, gradually improving,  on and off low grade fever up to 102*F. Wound dressing on right leg is clean no discharge, no s/s of infection, Repeat chest x-ray 12/02/23: shows no acute cardiopulmonary disease.  Blood cultures not growing any organism as of today, CT angiogram chest : Negative.  Will continue to monitor daily CBC. Patient on cefepime  IV. Anemia of Chronic Disease: Hemoglobin 7.7g/dL after PRBC transfusion.  Will continue to monitor daily CBC Chronic pain Syndrome: Continue oral home pain medication. Controlled type 2 diabetes mellitus with hyperglycemia: Stable, CBG monitor q 4,  Continue Jardiance  as prescribed. Ulcer of leg, chronic, right [facility]): Clean wound dressing, no s/s of infection. Continue wound care orders.      Code Status: Full Code Family Communication: N/A Disposition Plan: Not yet ready for discharge  Homer CHRISTELLA Cover NP  If 7PM-7AM, please contact night-coverage.  12/12/2023, 1:25 PM  LOS: 10 days

## 2023-12-12 NOTE — Consult Note (Addendum)
 WOC team received consult for dressing to R leg.  This patient is followed at [facility] last seen there 11/27/2023 for weekly dressing change and unna boot placement.  They are utilizing collagen to the wound bed and silver.  [facility] does not carry collagen wound products but will use silver hydrofiber  to wound bed prior to ortho tech reapplying unna boot. Please see WOC consult note 12/05/2023.   Orders placed as follows: Bedside nurse to cut off old unna boot, cleanse right ankle wound with NS, then apply a piece of silver hydrofiber (Aqu[employer]el [employer] Lawson # [id number]) to wound bed and cover with ABD pad.  Bedside to then  page the ortho tech to reapply right Unna boot.   Secure chat sent to bedside nurse with above information.    Thank you,    Powell Bar MSN, RN-BC, [employer]

## 2023-12-12 NOTE — Progress Notes (Signed)
 Dressing to right ankle changed at this time with aquacell and cleaned with NS.

## 2023-12-12 NOTE — Plan of Care (Signed)
  Problem: Clinical Measurements: Goal: Ability to maintain clinical measurements within normal limits will improve Outcome: Progressing   Problem: Coping: Goal: Level of anxiety will decrease Outcome: Progressing   Problem: Pain Managment: Goal: General experience of comfort will improve and/or be controlled Outcome: Progressing

## 2023-12-12 NOTE — Progress Notes (Addendum)
 A midline has been ordered for this patient. The [facility] RN has reviewed the patient's medical record including any arm restrictions, current creatinine clearance, length IV therapy is needed, and infusions needed/ordered to determine if a midline is the appropriate line for this individual patient. If there are contraindications, the physician and primary RN has been contacted by [facility] RN for further discussion. Midline Education: a midline is a long peripheral IV placed in the upper arm with the tip located at or near the axilla and distal to the shoulder should not be used as a CLABSI preventative measure   it has one lumen only it can remain in place for up to 29 days  Safe for Vancomycin  infusion LESS THAN 6 days Is safe for power injection if good blood return can be obtained and line flushes easily it CANNOT be used for continuous infusion of vesicants. Including TPN and chemotherapy Should NOT be placed for sole intent of obtaining labs as there is no guarantee blood can be successfully drawn from line  contraindicated in patients with thrombosis, hypercoagulability, decreased venous flow to the extremities, ESRD (without a nephrologist's approval), small vessels, allergy to polyurethane, or known/suspected presence of a device-related infection, bacteremia, or septicemia;  Therapeutic Phlebotomy done and pulled 350 ml. VS WNL and patient is hemodynamically stable. Started another PIV for PCA and ATB. RN made aware.

## 2023-12-12 NOTE — Progress Notes (Signed)
 Orthopedic Tech Progress Note Patient Details:  Michael Mullins 28-Apr-1981 [id number]  Ortho Devices Type of Ortho Device: Radio broadcast assistant Ortho Device/Splint Location: RLE Ortho Device/Splint Interventions: Ordered, Application   Post Interventions Patient Tolerated: Well  Tally Mattox OTR/L 12/12/2023, 7:09 PM

## 2023-12-13 DIAGNOSIS — D5700 Hb-SS disease with crisis, unspecified: Secondary | ICD-10-CM | POA: Diagnosis not present

## 2023-12-13 LAB — COMPREHENSIVE METABOLIC PANEL WITH GFR
ALT: 23 U/L (ref 0–44)
AST: 35 U/L (ref 15–41)
Albumin: 3.2 g/dL — ABNORMAL LOW (ref 3.5–5.0)
Alkaline Phosphatase: 125 U/L (ref 38–126)
Anion gap: 9 (ref 5–15)
BUN: 10 mg/dL (ref 6–20)
CO2: 25 mmol/L (ref 22–32)
Calcium: 9.3 mg/dL (ref 8.9–10.3)
Chloride: 103 mmol/L (ref 98–111)
Creatinine, Ser: 0.78 mg/dL (ref 0.61–1.24)
GFR, Estimated: >60 mL/min (ref >60)
Glucose, Bld: 121 mg/dL — ABNORMAL HIGH (ref 70–99)
Potassium: 3.9 mmol/L (ref 3.5–5.1)
Sodium: 137 mmol/L (ref 135–145)
Total Bilirubin: 3.7 mg/dL — ABNORMAL HIGH (ref 0.0–1.2)
Total Protein: 8.3 g/dL — ABNORMAL HIGH (ref 6.5–8.1)

## 2023-12-13 LAB — GLUCOSE, CAPILLARY
Glucose-Capillary: 134 mg/dL — ABNORMAL HIGH (ref 70–99)
Glucose-Capillary: 114 mg/dL — ABNORMAL HIGH (ref 70–99)
Glucose-Capillary: 150 mg/dL — ABNORMAL HIGH (ref 70–99)

## 2023-12-13 LAB — TYPE AND SCREEN
ABO/RH(D): A POS
Antibody Screen: POSITIVE

## 2023-12-13 LAB — CBC
HCT: 27.2 % — ABNORMAL LOW (ref 39.0–52.0)
Hemoglobin: 9.1 g/dL — ABNORMAL LOW (ref 13.0–17.0)
MCH: 31.7 pg (ref 26.0–34.0)
MCHC: 33.5 g/dL (ref 30.0–36.0)
MCV: 94.8 fL (ref 80.0–100.0)
Platelets: 426 K/uL — ABNORMAL HIGH (ref 150–400)
RBC: 2.87 MIL/uL — ABNORMAL LOW (ref 4.22–5.81)
RDW: 22 % — ABNORMAL HIGH (ref 11.5–15.5)
WBC: 13.2 K/uL — ABNORMAL HIGH (ref 4.0–10.5)
nRBC: 10.1 % — ABNORMAL HIGH (ref 0.0–0.2)

## 2023-12-13 LAB — LACTATE DEHYDROGENASE: LDH: 549 U/L — ABNORMAL HIGH (ref 98–192)

## 2023-12-13 MED ORDER — AMOXICILLIN-POT CLAVULANATE 875-125 MG PO TABS
1.0000 | ORAL_TABLET | Freq: Two times a day (BID) | ORAL | 0 refills | Status: AC
  Filled 2023-12-13: qty 10, 5d supply, fill #0

## 2023-12-13 NOTE — Plan of Care (Signed)
 Patient understood education being provided, however the patient still has progress to make regarding his plan of care. But, the patient is progressing enough for discharge.

## 2023-12-13 NOTE — Discharge Instructions (Signed)
 Physician Discharge Summary  Patient ID: Michael Mullins MRN: [id number] DOB/AGE: [birth year] 43 y.o.  Admit date: 12/01/2023 Discharge date: 12/13/2023  Admission Diagnoses:  Discharge Diagnoses:  Principal Problem:   Sickle cell pain crisis [facility]) Active Problems:   Controlled type 2 diabetes mellitus with hyperglycemia (HCC)   Other chronic pain   Leukocytosis   Ulcer of leg, chronic, right (HCC)   Discharged Condition: good  Discharge Exam: Blood pressure 132/77, pulse 93, temperature 98.9 F (37.2 C), temperature source Oral, resp. rate 16, height 5' 10 (1.778 m), weight 104.3 kg, SpO2 100%.  Disposition: Discharge disposition: 01-Home or Self Care       Discharge Instructions     Diet - low sodium heart healthy   Complete by: As directed    Increase activity slowly   Complete by: As directed    Leave dressing on - Keep it clean, dry, and intact until clinic visit   Complete by: As directed       Allergies as of 12/13/2023       Reactions   Morphine Hives   Tolerates hydromorphone     Vancomycin  Itching   Tolerates with longer infusion times and premedcation with IV bendryl        Medication List     TAKE these medications    amoxicillin -clavulanate 875-125 MG tablet Commonly known as: AUGMENTIN  Take 1 tablet by mouth 2 (two) times daily for 5 days.   folic acid  1 MG tablet Commonly known as: FOLVITE  Take 1 tablet (1 mg total) by mouth daily.   freestyle lancets Use 1 lancet to lance skin to check blood sugar once daily.   FreeStyle Libre 3 Plus Sensor Misc Apply one sensor to the back of the arm every 15 days for continuous glucose monitoring. Follow package directions for replacement.   FREESTYLE LITE test strip Generic drug: glucose blood Use 1 strip to check blood sugar daily.   gabapentin  300 MG capsule Commonly known as: NEURONTIN  Take 1 capsule (300 mg total) by mouth at bedtime.   Humira  (2 Pen) 40 MG/0.8ML Ajkt pen Generic drug:  adalimumab  Inject 0.8 mL (40 mg total) under the skin every 14 (fourteen) days.   ibuprofen  800 MG tablet Commonly known as: ADVIL  Take 1 tablet (800 mg total) by mouth every 8 (eight) hours as needed for fever 100.4 F or GREATER (pain).   Jardiance  10 MG Tabs tablet Generic drug: empagliflozin  Take 1 tablet (10 mg total) by mouth daily.   lidocaine 2 % jelly Commonly known as: XYLOCAINE Apply 5 mLs topically.   lisinopril  2.5 MG tablet Commonly known as: ZESTRIL  Take 1 tablet (2.5 mg total) by mouth daily.   naloxone  4 MG/0.1ML Liqd nasal spray kit Commonly known as: Narcan  Place 1 spray into one nostril as needed. (for excessive sedation)   ondansetron  4 MG disintegrating tablet Commonly known as: ZOFRAN -ODT Take 1 tablet (4 mg total) by mouth every 8 (eight) hours as needed for nausea or vomiting.   oxyCODONE  15 MG immediate release tablet Commonly known as: ROXICODONE  Take 1-2 tablets (15-30 mg total) by mouth every 8 (eight) hours as needed for breakthrough pain based on pain levels. Must last 30 days. What changed: how much to take   rosuvastatin  5 MG tablet Commonly known as: CRESTOR  Take 1 tablet (5 mg total) by mouth daily for cholesterol   Vitamin D  (Ergocalciferol ) 1.25 MG (50000 UNIT) Caps capsule Commonly known as: DRISDOL  Take 1 capsule (50,000 Units total) by mouth every  30 (thirty) days.   Xtampza  ER 27 MG C12a Generic drug: oxyCODONE  ER Take 1 capsule by mouth 2 (two) times daily. What changed: Another medication with the same name was added. Make sure you understand how and when to take each.   Xtampza  ER 27 MG C12a Generic drug: oxyCODONE  ER Take 1 capsule (27 mg total) by mouth 2 (two) times daily. What changed: You were already taking a medication with the same name, and this prescription was added. Make sure you understand how and when to take each.               Discharge Care Instructions  (From admission, onward)            Start     Ordered   12/13/23 0000  Leave dressing on - Keep it clean, dry, and intact until clinic visit        12/13/23 1122            Follow-up Information     Van Camie Norris, MD. Go in 6 day(s).   Specialty: Internal Medicine Contact information: [address] [place] [address] [phone]                 Signed: Deleta CHRISTELLA Savers 12/13/2023, 11:30 AM

## 2023-12-13 NOTE — Plan of Care (Signed)

## 2023-12-13 NOTE — Discharge Summary (Signed)
 Physician Discharge Summary  NOX TALENT FMW:[id number] DOB: [birth year] DOA: 12/01/2023  PCP: Van Camie Norris, MD  Admit date: 12/01/2023  Discharge date: 12/13/2023  Discharge Diagnoses:  Principal Problem:   Sickle cell pain crisis [facility]) Active Problems:   Controlled type 2 diabetes mellitus with hyperglycemia (HCC)   Other chronic pain   Leukocytosis   Ulcer of leg, chronic, right [facility])   Discharge Condition: Stable  Disposition:   Follow-up Information     Van Camie Norris, MD. Go in 6 day(s).   Specialty: Internal Medicine Contact information: [address] [place] [address] [phone]                Pt is discharged home in good condition and is to follow up with Van, Camie Norris, MD this week to have labs evaluated. Michael Mullins is instructed to increase activity slowly and balance with rest for the next few days, and use prescribed medication to complete treatment of pain  Diet: Regular Wt Readings from Last 3 Encounters:  12/01/23 104.3 kg  11/23/23 104.3 kg  04/30/23 104.3 kg    History of present illness:  Michael Mullins is a 43 y.o. male with medical history significant for sickle cell anemia and chronic pain, now presenting for evaluation of severe pain.   Patient reports that he woke yesterday morning with severe pain in his back, left arm, left leg, and right lateral ribs.  Symptoms are similar to his prior pain crises.  He is taking oxycodone  at home without relief.  Pain in the right ribs is worse if he touches it or lays on that side.  He denies shortness of breath, cough, fever, chills, abdominal pain, vomiting, diarrhea, or exertional chest pain.   ED Course: Upon arrival to the ED, patient is found to be afebrile and saturating well on 2 L/min of supplemental oxygen  with mild tachycardia and stable BP.  Labs are most notable for normal creatinine, bilirubin 9.6, WBC 24,500, and hemoglobin 8.5.  No acute  findings noted on chest x-ray.   Patient was treated with Toradol , IV fluids, and IV Dilaudid  x 3 in the ED.  [facility] Course:  Patient was admitted for sickle cell pain crisis and managed appropriately with IVF, IV Dilaudid  via PCA and IV Toradol , as well as other adjunct therapies per sickle cell pain management protocols.  Patient was also continued on his home oxycodone  and gabapentin .  His pain was very difficult to control during this admission, necessitating simple exchange transfusion.  He has a right leg ulcer that was wrapped with ACE bandage, monitored by [facility] wound center.  Wound care was consulted during this admission and the wound was cared for appropriately, and dressings were changed.  Few days into the admission, patient developed tachycardia and fever of 101.3 F, panculture were ordered which eventually all returned negative.  CT angiogram of chest was ordered to rule out pulmonary embolism, and PE was effectively ruled out.  His CTA revealed mild bilateral lower lobe atelectasis, very minimal left pleural effusion and stable diffuse bone sclerosis compatible with history of sickle cell disease.  Lactic acid was also normal but mildly elevated C-reactive protein.  Because of the fever and leukocytosis close mildly elevated CRP, patient was covered with broad-spectrum antibiotics even though there was no clear focus of infection.  Before he got simple exchange transfusion, patient was transfused with 2 units of packed red blood cells for hemoglobin of 6.4 which was significantly  below his baseline of above 7.  Patient's symptoms slowly improved with antibiotics and pain management.  He was also continued on his home medication for diabetes and hypertension with good control.  As at today, hemoglobin is stable at baseline, patient is eating and drinking appropriately with no restrictions, ambulating well with no significant pain.  Patient requested to be discharged home saying  his pain is at 2/10 which is his baseline. Patient was therefore discharged home today in a hemodynamically stable condition.  Patient will complete 5 days of oral Augmentin  to complete antibiotic regimen.  Patient will continue to follow-up for his routine blood transfusion at [facility] as well as his wound care.  He was counseled extensively about management of diabetes and for dietary and lifestyle modifications.  Also counseled about low-salt and DASH diet.  Patient verbalized understanding.  Michael Mullins will follow-up with PCP within 1 week of this discharge. Michael Mullins was counseled extensively about nonpharmacologic means of pain management, patient verbalized understanding and was appreciative of  the care received during this admission.   We discussed the need for good hydration, monitoring of hydration status, avoidance of heat, cold, stress, and infection triggers. We discussed the need to be adherent with taking Hydrea and other home medications. Patient was reminded of the need to seek medical attention immediately if any symptom of bleeding, anemia, or infection occurs.  Discharge Exam: Vitals:   12/13/23 0910 12/13/23 1026  BP: 127/75 132/77  Pulse:  93  Resp:  16  Temp:  98.9 F (37.2 C)  SpO2:  100%   Vitals:   12/13/23 0731 12/13/23 0736 12/13/23 0910 12/13/23 1026  BP: (!) 141/83  127/75 132/77  Pulse: 92   93  Resp: 10 10  16   Temp: 98.6 F (37 C)   98.9 F (37.2 C)  TempSrc: Oral   Oral  SpO2: 98% 98%  100%  Weight:      Height:        General appearance : Awake, alert, not in any distress. Speech Clear. Not toxic looking HEENT: Atraumatic and Normocephalic, pupils equally reactive to light and accomodation Neck: Supple, no JVD. No cervical lymphadenopathy.  Chest: Good air entry bilaterally, no added sounds  CVS: S1 S2 regular, no murmurs.  Abdomen: Bowel sounds present, Non tender and not distended with no gaurding, rigidity or  rebound. Extremities: Right lower extremity ulcer wrapped in ACE bandage.  B/L Lower Ext shows no edema, both legs are warm to touch Neurology: Awake alert, and oriented X 3, CN II-XII intact, Non focal Skin: No Rash  Discharge Instructions  Discharge Instructions     Diet - low sodium heart healthy   Complete by: As directed    Increase activity slowly   Complete by: As directed    Leave dressing on - Keep it clean, dry, and intact until clinic visit   Complete by: As directed       Allergies as of 12/13/2023       Reactions   Morphine Hives   Tolerates hydromorphone     Vancomycin  Itching   Tolerates with longer infusion times and premedcation with IV bendryl        Medication List     TAKE these medications    amoxicillin -clavulanate 875-125 MG tablet Commonly known as: AUGMENTIN  Take 1 tablet by mouth 2 (two) times daily for 5 days.   folic acid  1 MG tablet Commonly known as: FOLVITE  Take 1 tablet (1 mg total) by mouth  daily.   freestyle lancets Use 1 lancet to lance skin to check blood sugar once daily.   FreeStyle Libre 3 Plus Sensor Misc Apply one sensor to the back of the arm every 15 days for continuous glucose monitoring. Follow package directions for replacement.   FREESTYLE LITE test strip Generic drug: glucose blood Use 1 strip to check blood sugar daily.   gabapentin  300 MG capsule Commonly known as: NEURONTIN  Take 1 capsule (300 mg total) by mouth at bedtime.   Humira  (2 Pen) 40 MG/0.8ML Ajkt pen Generic drug: adalimumab  Inject 0.8 mL (40 mg total) under the skin every 14 (fourteen) days.   ibuprofen  800 MG tablet Commonly known as: ADVIL  Take 1 tablet (800 mg total) by mouth every 8 (eight) hours as needed for fever 100.4 F or GREATER (pain).   Jardiance  10 MG Tabs tablet Generic drug: empagliflozin  Take 1 tablet (10 mg total) by mouth daily.   lidocaine 2 % jelly Commonly known as: XYLOCAINE Apply 5 mLs topically.   lisinopril  2.5  MG tablet Commonly known as: ZESTRIL  Take 1 tablet (2.5 mg total) by mouth daily.   naloxone  4 MG/0.1ML Liqd nasal spray kit Commonly known as: Narcan  Place 1 spray into one nostril as needed. (for excessive sedation)   ondansetron  4 MG disintegrating tablet Commonly known as: ZOFRAN -ODT Take 1 tablet (4 mg total) by mouth every 8 (eight) hours as needed for nausea or vomiting.   oxyCODONE  15 MG immediate release tablet Commonly known as: ROXICODONE  Take 1-2 tablets (15-30 mg total) by mouth every 8 (eight) hours as needed for breakthrough pain based on pain levels. Must last 30 days. What changed: how much to take   rosuvastatin  5 MG tablet Commonly known as: CRESTOR  Take 1 tablet (5 mg total) by mouth daily for cholesterol   Vitamin D  (Ergocalciferol ) 1.25 MG (50000 UNIT) Caps capsule Commonly known as: DRISDOL  Take 1 capsule (50,000 Units total) by mouth every 30 (thirty) days.   Xtampza  ER 27 MG C12a Generic drug: oxyCODONE  ER Take 1 capsule by mouth 2 (two) times daily. What changed: Another medication with the same name was added. Make sure you understand how and when to take each.   Xtampza  ER 27 MG C12a Generic drug: oxyCODONE  ER Take 1 capsule (27 mg total) by mouth 2 (two) times daily. What changed: You were already taking a medication with the same name, and this prescription was added. Make sure you understand how and when to take each.               Discharge Care Instructions  (From admission, onward)           Start     Ordered   12/13/23 0000  Leave dressing on - Keep it clean, dry, and intact until clinic visit        12/13/23 1122            The results of significant diagnostics from this hospitalization (including imaging, microbiology, ancillary and laboratory) are listed below for reference.    Significant Diagnostic Studies: VAS US  UPPER EXTREMITY VENOUS DUPLEX Result Date: 12/11/2023 UPPER VENOUS STUDY  Patient Name:  Michael Mullins  Date of Exam:   12/11/2023 Medical Rec #: [id number]       Accession #:    [id number] Date of Birth: [birth year]       Patient Gender: M Patient Age:   43 years Exam [facility]  [facility] Procedure:  VAS US  UPPER EXTREMITY VENOUS DUPLEX Referring Phys: HOMER COVER --------------------------------------------------------------------------------  Indications: Pain, Swelling, and Sickle Cell Crisis Limitations: Poor ultrasound/tissue interface and poor scanning environment (constant interuptions from staff). Comparison Study: No prior study on file Performing Technologist: Alberta Lis RVS  Examination Guidelines: A complete evaluation includes B-mode imaging, spectral Doppler, color Doppler, and power Doppler as needed of all accessible portions of each vessel. Bilateral testing is considered an integral part of a complete examination. Limited examinations for reoccurring indications may be performed as noted.  Right Findings: +----------+------------+---------+-----------+----------+--------------------+ RIGHT     CompressiblePhasicitySpontaneousProperties      Summary        +----------+------------+---------+-----------+----------+--------------------+ IJV           Full       Yes       Yes                                   +----------+------------+---------+-----------+----------+--------------------+ Subclavian               Yes       Yes                   patent by                                                              color/doppler     +----------+------------+---------+-----------+----------+--------------------+ Axillary                 Yes       Yes                   patent by                                                              color/doppler     +----------+------------+---------+-----------+----------+--------------------+ Brachial      Full                                                        +----------+------------+---------+-----------+----------+--------------------+ Radial        Full                                                       +----------+------------+---------+-----------+----------+--------------------+ Ulnar                    Yes       Yes              not well visualized  +----------+------------+---------+-----------+----------+--------------------+ Cephalic      Full                                                       +----------+------------+---------+-----------+----------+--------------------+  Basilic                  Yes       Yes                                   +----------+------------+---------+-----------+----------+--------------------+ Only one of paired ulner veins visualized - patent by color/doppler  Left Findings: +----------+------------+---------+-----------+----------+--------------------+ LEFT      CompressiblePhasicitySpontaneousProperties      Summary        +----------+------------+---------+-----------+----------+--------------------+ IJV           Full       Yes       Yes                                   +----------+------------+---------+-----------+----------+--------------------+ Subclavian               Yes       Yes                   patent by                                                              color/doppler     +----------+------------+---------+-----------+----------+--------------------+ Axillary                 Yes       Yes                   patent by                                                              color/doppler     +----------+------------+---------+-----------+----------+--------------------+ Brachial      Full                                                       +----------+------------+---------+-----------+----------+--------------------+ Radial        Full                                                        +----------+------------+---------+-----------+----------+--------------------+ Ulnar         Full                                                       +----------+------------+---------+-----------+----------+--------------------+ Cephalic      Full                                                       +----------+------------+---------+-----------+----------+--------------------+  Basilic       Full                                  not well visualized  +----------+------------+---------+-----------+----------+--------------------+  Summary: No evidence of deep vein or superficial vein thrombosis involving the right and left upper extremities, however some portions of this exam was limited due to above stated limitations.  *See table(s) above for measurements and observations.  Diagnosing physician: Norman Serve Electronically signed by Norman Serve on 12/11/2023 at 6:30:17 PM.    Final    CT Angio Chest Pulmonary Embolism (PE) W or WO Contrast Result Date: 12/09/2023 CLINICAL DATA:  Chest pain. Sickle cell crisis. Clinical concern for pulmonary embolism. EXAM: CT ANGIOGRAPHY CHEST WITH CONTRAST TECHNIQUE: Multidetector CT imaging of the chest was performed using the standard protocol during bolus administration of intravenous contrast. Multiplanar CT image reconstructions and MIPs were obtained to evaluate the vascular anatomy. RADIATION DOSE REDUCTION: This exam was performed according to the departmental dose-optimization program which includes automated exposure control, adjustment of the mA and/or kV according to patient size and/or use of iterative reconstruction technique. CONTRAST:  75mL OMNIPAQUE  IOHEXOL  350 MG/ML SOLN COMPARISON:  05/30/2021 FINDINGS: Cardiovascular: Stable mildly enlarged heart. No pericardial effusion. Normally opacified pulmonary arteries with no pulmonary arterial filling defects seen. Mediastinum/Nodes: No enlarged mediastinal, hilar, or axillary lymph nodes.  Thyroid gland, trachea, and esophagus demonstrate no significant findings. Lungs/Pleura: A 4 mm mean diameter superior segment right lower lobe nodule on image number 55/10 is unchanged. Several additional smaller right lung nodules are unchanged. None of these nodules need imaging follow-up. Mild bilateral lower lobe atelectasis. Very minimal left pleural effusion. Upper Abdomen: Unremarkable. Musculoskeletal: Stable diffuse bone sclerosis compatible with the history of sickle cell disease. Minimal thoracic spine degenerative changes. Review of the MIP images confirms the above findings. IMPRESSION: 1. No pulmonary embolism. 2. Mild bilateral lower lobe atelectasis. 3. Very minimal left pleural effusion. 4. Stable mild cardiomegaly. 5. Stable diffuse bone sclerosis compatible with the history of sickle cell disease. Electronically Signed   By: Elspeth Bathe M.D.   On: 12/09/2023 17:19   DG CHEST PORT 1 VIEW Result Date: 12/02/2023 CLINICAL DATA:  141880 SOB (shortness of breath) [id number] EXAM: PORTABLE CHEST - 1 VIEW COMPARISON:  12/01/2023 FINDINGS: Stable left infrahilar scarring or atelectasis. Lungs otherwise clear. Heart size and mediastinal contours are within normal limits. No effusion. Visualized bones unremarkable. IMPRESSION: No acute cardiopulmonary disease. Electronically Signed   By: JONETTA Faes M.D.   On: 12/02/2023 17:45   DG Chest Port 1 View Result Date: 12/01/2023 CLINICAL DATA:  Sickle cell crisis EXAM: PORTABLE CHEST 1 VIEW COMPARISON:  08/26/2022 FINDINGS: Heart and mediastinal contours within normal limits. Increased markings in the lung bases are similar prior study and could reflect scarring or atelectasis. No acute confluent opacities or effusions. No acute bony abnormality. IMPRESSION: Bibasilar scarring or atelectasis. No active disease. Electronically Signed   By: Franky Crease M.D.   On: 12/01/2023 23:49    Microbiology: Recent Results (from the past 240 hours)  Culture, blood  (Routine X 2) w Reflex to ID Panel     Status: None (Preliminary result)   Collection Time: 12/09/23  7:32 PM   Specimen: BLOOD  Result Value Ref Range Status   Specimen Description   Final    BLOOD BLOOD RIGHT HAND Performed at [facility], [address]  [place], [place] [address]    Special Requests   Final    BOTTLES DRAWN AEROBIC ONLY Blood Culture results may not be optimal due to an inadequate volume of blood received in culture bottles Performed at [facility], [address] [place], [place] [address]    Culture   Final    NO GROWTH 4 DAYS Performed at [facility], [address] [place], [place] [address]    Report Status PENDING  Incomplete  Culture, blood (Routine X 2) w Reflex to ID Panel     Status: None (Preliminary result)   Collection Time: 12/09/23  7:37 PM   Specimen: BLOOD  Result Value Ref Range Status   Specimen Description   Final    BLOOD BLOOD LEFT ARM Performed at [facility], [address] [place], [place] [address]    Special Requests   Final    BOTTLES DRAWN AEROBIC ONLY Blood Culture adequate volume Performed at [facility], [address], [place], [place] [address]    Culture   Final    NO GROWTH 4 DAYS Performed at [facility], [address], [place], [place] [address]    Report Status PENDING  Incomplete     Labs: Basic Metabolic Panel: Recent Labs  Lab 12/07/23 1026 12/09/23 0609 12/09/23 0949 12/11/23 0552 12/13/23 0516  NA 139  --  137 135 137  K 3.8  --  3.5 3.3* 3.9  CL 106  --  100 103 103  CO2 24  --  26 24 25   GLUCOSE 133*  --  184* 210* 121*  BUN 8  --  8 9 10   CREATININE 0.56* 0.67 0.55* 0.68 0.78  CALCIUM  8.6*  --  9.2 8.6* 9.3   Liver Function Tests: Recent Labs  Lab 12/09/23 0949 12/11/23 0552 12/13/23 0516  AST 46* 31 35  ALT 23 22 23   ALKPHOS 133* 123 125  BILITOT 5.0* 4.2* 3.7*  PROT 8.3* 8.2* 8.3*   ALBUMIN 3.7 3.3* 3.2*   No results for input(s): LIPASE, AMYLASE in the last 168 hours. No results for input(s): AMMONIA in the last 168 hours. CBC: Recent Labs  Lab 12/07/23 1026 12/09/23 0949 12/10/23 0618 12/11/23 0552 12/12/23 1138 12/13/23 0516  WBC 16.0* 25.2* 23.2* 19.3* 15.2* 13.2*  NEUTROABS 8.9* 16.5*  --   --   --   --   HGB 7.0* 7.6* 7.5* 6.8* 7.7* 9.1*  HCT 21.4* 23.4* 23.4* 21.3* 23.9* 27.2*  MCV 104.9* 100.9* 99.2 97.3 94.5 94.8  PLT 191 251 276 373 414* 426*   Cardiac Enzymes: No results for input(s): CKTOTAL, CKMB, CKMBINDEX, TROPONINI in the last 168 hours. BNP: Invalid input(s): POCBNP CBG: Recent Labs  Lab 12/12/23 2031 12/12/23 2329 12/13/23 0314 12/13/23 0732 12/13/23 1120  GLUCAP 164* 132* 134* 114* 150*    Time coordinating discharge: 50 minutes  Signed:  Leeah Politano  [facility] 12/13/2023, 11:23 AM

## 2023-12-14 LAB — CULTURE, BLOOD (ROUTINE X 2)
Culture: NO GROWTH
Culture: NO GROWTH
Special Requests: ADEQUATE

## 2023-12-15 ENCOUNTER — Other Ambulatory Visit: Payer: Self-pay

## 2023-12-15 ENCOUNTER — Other Ambulatory Visit (HOSPITAL_COMMUNITY): Payer: Self-pay

## 2023-12-15 NOTE — Progress Notes (Signed)
 Specialty Pharmacy Refill Coordination Note  Michael Mullins is a 43 y.o. male contacted today regarding refills of specialty medication(s) Adalimumab  (Humira  (2 Pen))   Patient requested Delivery   Delivery date: 12/18/23   Verified address: [address]  [place]   Medication will be filled on 12/17/23.

## 2023-12-16 ENCOUNTER — Other Ambulatory Visit: Payer: Self-pay

## 2023-12-16 LAB — TYPE AND SCREEN
ABO/RH(D): A POS
Antibody Screen: POSITIVE
Donor AG Type: NEGATIVE
Donor AG Type: NEGATIVE
Unit division: 0
Unit division: 0

## 2023-12-16 LAB — BPAM RBC
Blood Product Expiration Date: 202507232359
Blood Product Expiration Date: 202508052359
ISSUE DATE / TIME: 202507041744
ISSUE DATE / TIME: 202507042106
Unit Type and Rh: 6200
Unit Type and Rh: 6200

## 2023-12-19 DIAGNOSIS — I83013 Varicose veins of right lower extremity with ulcer of ankle: Secondary | ICD-10-CM | POA: Diagnosis not present

## 2023-12-19 DIAGNOSIS — L97319 Non-pressure chronic ulcer of right ankle with unspecified severity: Secondary | ICD-10-CM | POA: Diagnosis not present

## 2023-12-19 DIAGNOSIS — D571 Sickle-cell disease without crisis: Secondary | ICD-10-CM | POA: Diagnosis not present

## 2023-12-19 NOTE — Progress Notes (Signed)
 Debridement Other (comment) (sickle cell ulcer) Posterior;Right Ankle  Performed by: Patriciaann Aloha Carmel, MD Authorized by: Patriciaann Aloha Carmel, MD  Associated wounds:  Wound 11/20/23 Other (comment) Ankle Posterior;Right  Performed By:  Physician Debridement Type:  Debridement Goals of Debridement:  Remove devitalized tissue, Prevent further complications, Decrease risk of infection and Promote wound healing Severity of Tissue Pre-Debridement:  Fat Layer Exposed Level of Consciousness Pre-Procedure:  Awake and Alert Pre-Procedure Verification/Time-Out Taken:  Yes Start Time:  12/19/2023 2:25 PM Pain Control:  Lidocaine 2% Topical Gel Tissue and Other Material Debrided:  Non-Viable, Biofilm, Subcutaneous and Slough Excisional Method:  Excisional Level:  Subcutaneous Tissue Instrument:  Curette Specimen:  None Bleeding:  Minimum Hemostasis Achieved:  Pressure End Time:  12/19/2023 2:31 PM Procedural Pain:  3 Post Procedural Pain:  0 Response to Treatment:  Procedure was Tolerated Well Level of Consciousness Post-Procedure:  Awake and Alert Pre-debridement measurements:    Time taken:  12/19/2023 2:12 PM   Length (cm):  1   Width (cm):  0.5   Depth (cm):  0.1 Post-debridement measurements:    Time taken:  12/19/2023 2:12 PM   Length (cm):  1.2   Width (cm):  0.7   Depth (cm):  0.2 Total Area Debrided:    Length (cm):  1.2   Width (cm):  0.7   Surface Area (cm2):  0.84 Character of Wound / Ulcer Post Debridement:  Stable Severity of Tissue Post Debridement:  Fat Layer Exposed Post Procedure Diagnosis:  Same as Pre-Procedure Wound Dressing:  Collagen

## 2023-12-22 ENCOUNTER — Telehealth: Payer: Self-pay | Admitting: Pharmacist

## 2023-12-22 NOTE — Telephone Encounter (Signed)
 Called patient to schedule an appointment for the [facility] [employer] Specialty Medication Clinic. I was unable to reach the patient so I left a HIPAA-compliant message requesting that the patient return my call.   Butch Penny, PharmD, Patsy Baltimore, CPP Clinical Pharmacist [facility] & [facility] [phone]

## 2023-12-25 ENCOUNTER — Other Ambulatory Visit (HOSPITAL_COMMUNITY): Payer: Self-pay

## 2023-12-25 MED ORDER — LISINOPRIL 2.5 MG PO TABS
2.5000 mg | ORAL_TABLET | Freq: Every day | ORAL | 3 refills | Status: AC
  Filled 2023-12-25 – 2024-01-07 (×2): qty 90, 90d supply, fill #0
  Filled 2024-04-15: qty 90, 90d supply, fill #1

## 2023-12-25 MED ORDER — FOLIC ACID 1 MG PO TABS
1.0000 mg | ORAL_TABLET | Freq: Every day | ORAL | 3 refills | Status: AC
  Filled 2023-12-25 – 2024-01-07 (×2): qty 90, 90d supply, fill #0
  Filled 2024-04-15: qty 90, 90d supply, fill #1

## 2023-12-29 DIAGNOSIS — I890 Lymphedema, not elsewhere classified: Secondary | ICD-10-CM | POA: Diagnosis not present

## 2023-12-29 DIAGNOSIS — E119 Type 2 diabetes mellitus without complications: Secondary | ICD-10-CM | POA: Diagnosis not present

## 2023-12-29 DIAGNOSIS — D571 Sickle-cell disease without crisis: Secondary | ICD-10-CM | POA: Diagnosis not present

## 2023-12-29 DIAGNOSIS — L97319 Non-pressure chronic ulcer of right ankle with unspecified severity: Secondary | ICD-10-CM | POA: Diagnosis not present

## 2023-12-29 DIAGNOSIS — I872 Venous insufficiency (chronic) (peripheral): Secondary | ICD-10-CM | POA: Diagnosis not present

## 2023-12-29 DIAGNOSIS — I83013 Varicose veins of right lower extremity with ulcer of ankle: Secondary | ICD-10-CM | POA: Diagnosis not present

## 2024-01-02 ENCOUNTER — Other Ambulatory Visit: Payer: Self-pay

## 2024-01-05 DIAGNOSIS — E119 Type 2 diabetes mellitus without complications: Secondary | ICD-10-CM | POA: Diagnosis not present

## 2024-01-05 DIAGNOSIS — L97319 Non-pressure chronic ulcer of right ankle with unspecified severity: Secondary | ICD-10-CM | POA: Diagnosis not present

## 2024-01-05 DIAGNOSIS — I83013 Varicose veins of right lower extremity with ulcer of ankle: Secondary | ICD-10-CM | POA: Diagnosis not present

## 2024-01-05 DIAGNOSIS — I872 Venous insufficiency (chronic) (peripheral): Secondary | ICD-10-CM | POA: Diagnosis not present

## 2024-01-05 DIAGNOSIS — I890 Lymphedema, not elsewhere classified: Secondary | ICD-10-CM | POA: Diagnosis not present

## 2024-01-05 DIAGNOSIS — D571 Sickle-cell disease without crisis: Secondary | ICD-10-CM | POA: Diagnosis not present

## 2024-01-05 DIAGNOSIS — I878 Other specified disorders of veins: Secondary | ICD-10-CM | POA: Diagnosis not present

## 2024-01-06 ENCOUNTER — Other Ambulatory Visit (HOSPITAL_COMMUNITY): Payer: Self-pay

## 2024-01-07 ENCOUNTER — Other Ambulatory Visit (HOSPITAL_COMMUNITY): Payer: Self-pay

## 2024-01-13 ENCOUNTER — Other Ambulatory Visit (HOSPITAL_COMMUNITY): Payer: Self-pay

## 2024-01-13 ENCOUNTER — Encounter (HOSPITAL_COMMUNITY): Payer: Self-pay

## 2024-01-13 MED ORDER — OXYCODONE HCL 15 MG PO TABS
15.0000 mg | ORAL_TABLET | Freq: Three times a day (TID) | ORAL | 0 refills | Status: DC | PRN
  Filled 2024-01-13: qty 150, 30d supply, fill #0

## 2024-01-13 MED ORDER — XTAMPZA ER 27 MG PO C12A
27.0000 mg | EXTENDED_RELEASE_CAPSULE | Freq: Two times a day (BID) | ORAL | 0 refills | Status: AC
  Filled 2024-01-13: qty 60, 30d supply, fill #0

## 2024-01-23 ENCOUNTER — Other Ambulatory Visit (HOSPITAL_COMMUNITY): Payer: Self-pay

## 2024-01-23 ENCOUNTER — Other Ambulatory Visit: Payer: Self-pay

## 2024-01-23 DIAGNOSIS — Z5181 Encounter for therapeutic drug level monitoring: Secondary | ICD-10-CM | POA: Diagnosis not present

## 2024-01-23 DIAGNOSIS — L700 Acne vulgaris: Secondary | ICD-10-CM | POA: Diagnosis not present

## 2024-01-23 MED ORDER — CLINDAMYCIN PHOSPHATE 1 % EX SOLN
CUTANEOUS | 5 refills | Status: AC
  Filled 2024-01-23: qty 60, 30d supply, fill #0

## 2024-01-23 MED ORDER — TRETINOIN 0.025 % EX CREA
TOPICAL_CREAM | CUTANEOUS | 5 refills | Status: AC
  Filled 2024-01-23: qty 45, 30d supply, fill #0

## 2024-01-26 ENCOUNTER — Ambulatory Visit: Attending: Internal Medicine | Admitting: Pharmacist

## 2024-01-26 ENCOUNTER — Encounter: Payer: Self-pay | Admitting: Pharmacist

## 2024-01-26 ENCOUNTER — Telehealth: Payer: Self-pay | Admitting: Pharmacist

## 2024-01-26 ENCOUNTER — Other Ambulatory Visit: Payer: Self-pay

## 2024-01-26 DIAGNOSIS — Z79899 Other long term (current) drug therapy: Secondary | ICD-10-CM

## 2024-01-26 MED ORDER — HUMIRA (2 PEN) 40 MG/0.4ML ~~LOC~~ AJKT: AUTO-INJECTOR | SUBCUTANEOUS | 6 refills | Status: DC

## 2024-01-26 MED ORDER — HUMIRA (2 PEN) 40 MG/0.4ML ~~LOC~~ AJKT
AUTO-INJECTOR | SUBCUTANEOUS | 6 refills | Status: DC
  Filled 2024-01-26: qty 2, fill #0
  Filled 2024-01-29: qty 2, 28d supply, fill #0

## 2024-01-26 NOTE — Progress Notes (Signed)
 Refills were denied (patient aware) - needs appointment. Refills approved 8/18, and were sent to [facility] for rewrite. Patient needs appointment with Herlene. Left voice mail for patient to contact [facility].

## 2024-01-26 NOTE — Telephone Encounter (Signed)
 Called patient to schedule an appointment for the [facility] [employer] Specialty Medication Clinic. I was unable to reach the patient so I left a HIPAA-compliant message requesting that the patient return my call.   Herlene Fleeta Morris, PharmD, JAQUELINE, CPP Clinical Pharmacist [facility] & [facility] [phone]

## 2024-01-26 NOTE — Progress Notes (Signed)
  S: Patient presents for review of their specialty medication therapy.  Patient is taking Humira  for HS. Patient is managed by Dr. Leetta for this.   Adherence: confirms  Efficacy: confirms good results with the medication  Dosing: Hidradenitis suppurativa (Humira  only): SubQ: Initial: 160 mg (given as four 40 mg injections on day 1 or given as two 40 mg injections per day over 2 consecutive days), then 80 mg 2 weeks later (day 15). Maintenance: 40 mg every week beginning day 29.  Dose adjustments: Renal: no dose adjustments (has not been studied) Hepatic: no dose adjustments (has not been studied)  Drug-drug interactions:none identified   Screening: TB test: completed per pt, negative  Hepatitis: completed per pt, negative   Monitoring:  S/sx of infection: none  CBC: monitored by his specialist. See CareEverywhere  S/sx of hypersensitivity: none  S/sx of malignancy: none S/sx of heart failure: none   Other side effects: none   O:     Lab Results  Component Value Date   WBC 13.2 (H) 12/13/2023   HGB 9.1 (L) 12/13/2023   HCT 27.2 (L) 12/13/2023   MCV 94.8 12/13/2023   PLT 426 (H) 12/13/2023      Chemistry      Component Value Date/Time   NA 137 12/13/2023 0516   K 3.9 12/13/2023 0516   CL 103 12/13/2023 0516   CO2 25 12/13/2023 0516   BUN 10 12/13/2023 0516   CREATININE 0.78 12/13/2023 0516      Component Value Date/Time   CALCIUM  9.3 12/13/2023 0516   ALKPHOS 125 12/13/2023 0516   AST 35 12/13/2023 0516   ALT 23 12/13/2023 0516   BILITOT 3.7 (H) 12/13/2023 0516       A/P: 1. Medication review: Patient is taking Humira  for HS. Reviewed the medication with the patient, including the following: Humira  is a TNF blocking agent indicated for ankylosing spondylitis, Crohn's disease, Hidradenitis suppurativa, psoriatic arthritis, plaque psoriasis, ulcerative colitis, and uveitis. Patient educated on purpose, proper use and potential adverse effects of Humira .  Possible adverse effects are increased risk of infections, headache, and injection site reactions. There is the possibility of an increased risk of malignancy but it is not well understood if this increased risk is due to there medication or the disease state. There are rare cases of pancytopenia and aplastic anemia. For SubQ injection at separate sites in the thigh or lower abdomen (avoiding areas within 2 inches of navel); rotate injection sites. May leave at room temperature for ~15 to 30 minutes prior to use; do not remove cap or cover while allowing product to reach room temperature. Do not use if solution is discolored or contains particulate matter. Do not administer to skin which is red, tender, bruised, hard, or that has scars, stretch marks, or psoriasis plaques. Needle cap of the prefilled syringe or needle cover for the adalimumab  pen may contain latex. Prefilled pens and syringes are available for use by patients and the full amount of the syringe should be injected (self-administration); the vial is intended for institutional use only. Vials do not contain a preservative; discard unused portion. No recommendations for any changes at this time.   Herlene Fleeta Morris, PharmD, JAQUELINE, CPP Clinical Pharmacist [facility] [phone]

## 2024-01-29 ENCOUNTER — Other Ambulatory Visit (HOSPITAL_COMMUNITY): Payer: Self-pay

## 2024-01-29 ENCOUNTER — Other Ambulatory Visit: Payer: Self-pay

## 2024-01-29 ENCOUNTER — Other Ambulatory Visit: Payer: Self-pay | Admitting: Pharmacy Technician

## 2024-01-29 NOTE — Progress Notes (Signed)
 Specialty Pharmacy Refill Coordination Note  Michael Mullins is a 43 y.o. male contacted today regarding refills of specialty medication(s) Adalimumab  (Humira  (2 Pen))  Patient requested Delivery   Delivery date: 02/03/24   Verified address: [address]   [place] [address]  Medication will be filled on 02/02/24.  Injection due on 8/24-patient stated he will inject once med is delivered to him.   Patient understood & aware that it will take some time for PA to process.  PA needed-Routed to [facility] for Benefits Investigation.   *** Please fill as soon as PA is approved & call patient to confirm new delivery date once resolved.

## 2024-01-30 ENCOUNTER — Other Ambulatory Visit: Payer: Self-pay

## 2024-01-30 ENCOUNTER — Telehealth: Payer: Self-pay

## 2024-01-30 NOTE — Progress Notes (Signed)
 PA pending

## 2024-01-30 NOTE — Telephone Encounter (Addendum)
 Pharmacy Patient Advocate Encounter   Received notification from Patient Pharmacy that prior authorization for Humira  is required/requested.   Insurance verification completed.   The patient is insured through [facility] .   Per test claim: PA required; However, NEW/RECENT labs/notes are needed to complete & submit PA request. Please see below.  Faxed key to provider for them to complete. [id number]

## 2024-02-02 ENCOUNTER — Other Ambulatory Visit: Payer: Self-pay

## 2024-02-03 ENCOUNTER — Other Ambulatory Visit: Payer: Self-pay

## 2024-02-04 ENCOUNTER — Other Ambulatory Visit (HOSPITAL_COMMUNITY): Payer: Self-pay

## 2024-02-04 ENCOUNTER — Other Ambulatory Visit: Payer: Self-pay

## 2024-02-04 NOTE — Progress Notes (Signed)
 PA still pending. Faxed key to provider again

## 2024-02-05 ENCOUNTER — Other Ambulatory Visit: Payer: Self-pay

## 2024-02-10 ENCOUNTER — Other Ambulatory Visit (HOSPITAL_COMMUNITY): Payer: Self-pay

## 2024-02-10 ENCOUNTER — Other Ambulatory Visit: Payer: Self-pay

## 2024-02-10 MED ORDER — XTAMPZA ER 27 MG PO C12A
1.0000 | EXTENDED_RELEASE_CAPSULE | Freq: Two times a day (BID) | ORAL | 0 refills | Status: AC
  Filled 2024-02-10: qty 60, 30d supply, fill #0

## 2024-02-10 MED ORDER — OXYCODONE HCL 15 MG PO TABS
15.0000 mg | ORAL_TABLET | Freq: Three times a day (TID) | ORAL | 0 refills | Status: DC | PRN
  Filled 2024-02-10: qty 150, 50d supply, fill #0
  Filled 2024-02-11: qty 150, 30d supply, fill #0

## 2024-02-11 ENCOUNTER — Other Ambulatory Visit: Payer: Self-pay

## 2024-02-11 ENCOUNTER — Other Ambulatory Visit (HOSPITAL_COMMUNITY): Payer: Self-pay

## 2024-02-12 ENCOUNTER — Other Ambulatory Visit: Payer: Self-pay

## 2024-02-12 NOTE — Progress Notes (Signed)
 Previous PA form expired. Started new PA-status is pending

## 2024-02-13 ENCOUNTER — Other Ambulatory Visit: Payer: Self-pay

## 2024-02-13 NOTE — Telephone Encounter (Signed)
 Pharmacy Patient Advocate Encounter  Received notification from [employer] that Prior Authorization for Humira  has been DENIED.      PA #/Case ID/Reference #: [id number]  Denial letter faxed to provider. Office will send in regular Humira -an authorization for Humira  40mg /0.80ml pens has been approved for 12 months.

## 2024-02-16 ENCOUNTER — Other Ambulatory Visit: Payer: Self-pay

## 2024-02-25 ENCOUNTER — Other Ambulatory Visit: Payer: Self-pay

## 2024-02-25 DIAGNOSIS — D571 Sickle-cell disease without crisis: Secondary | ICD-10-CM | POA: Diagnosis not present

## 2024-02-25 MED ORDER — HUMIRA (2 PEN) 40 MG/0.8ML ~~LOC~~ AJKT: 40.0000 mg | AUTO-INJECTOR | SUBCUTANEOUS | 11 refills | Status: DC

## 2024-02-26 ENCOUNTER — Other Ambulatory Visit: Payer: Self-pay

## 2024-02-26 NOTE — Progress Notes (Signed)
 Office sent in new prescription for non-CF formulation, but directions state inject every week instead of every 2 weeks. Nurse is getting provider to check which dose is correct and will send new prescription

## 2024-02-27 ENCOUNTER — Other Ambulatory Visit (HOSPITAL_COMMUNITY): Payer: Self-pay

## 2024-02-27 ENCOUNTER — Other Ambulatory Visit: Payer: Self-pay | Admitting: Pharmacist

## 2024-02-27 ENCOUNTER — Other Ambulatory Visit: Payer: Self-pay

## 2024-02-27 MED ORDER — HUMIRA (2 PEN) 40 MG/0.8ML ~~LOC~~ AJKT: AUTO-INJECTOR | SUBCUTANEOUS | 11 refills | Status: DC

## 2024-02-27 MED ORDER — HUMIRA (2 PEN) 40 MG/0.8ML ~~LOC~~ AJKT
AUTO-INJECTOR | SUBCUTANEOUS | 11 refills | Status: AC
  Filled 2024-02-27: qty 2, 28d supply, fill #0
  Filled 2024-03-18: qty 2, 28d supply, fill #1
  Filled 2024-04-15: qty 2, 28d supply, fill #2
  Filled 2024-05-17: qty 2, 28d supply, fill #3
  Filled 2024-06-09: qty 2, 28d supply, fill #4
  Filled 2024-07-14: qty 2, 28d supply, fill #5

## 2024-02-27 NOTE — Progress Notes (Signed)
 LVM for patient; New shipping 9/22 for 9/23.

## 2024-03-01 ENCOUNTER — Other Ambulatory Visit: Payer: Self-pay

## 2024-03-01 ENCOUNTER — Encounter (HOSPITAL_COMMUNITY): Payer: Self-pay | Admitting: Emergency Medicine

## 2024-03-01 ENCOUNTER — Emergency Department (HOSPITAL_COMMUNITY)
Admission: EM | Admit: 2024-03-01 | Discharge: 2024-03-02 | Disposition: A | Discharge status: Home / Self Care | Attending: Emergency Medicine | Admitting: Emergency Medicine

## 2024-03-01 DIAGNOSIS — D72829 Elevated white blood cell count, unspecified: Secondary | ICD-10-CM | POA: Diagnosis not present

## 2024-03-01 DIAGNOSIS — D5700 Hb-SS disease with crisis, unspecified: Secondary | ICD-10-CM | POA: Insufficient documentation

## 2024-03-01 LAB — CBC WITH DIFFERENTIAL/PLATELET
Basophils Absolute: 0 K/uL (ref 0.0–0.1)
Basophils Relative: 0 %
Eosinophils Absolute: 0 K/uL (ref 0.0–0.5)
Eosinophils Relative: 0 %
HCT: 25.5 % — ABNORMAL LOW (ref 39.0–52.0)
Hemoglobin: 9.6 g/dL — ABNORMAL LOW (ref 13.0–17.0)
Lymphocytes Relative: 29 %
Lymphs Abs: 3.4 K/uL (ref 0.7–4.0)
MCH: 36.2 pg — ABNORMAL HIGH (ref 26.0–34.0)
MCHC: 37.6 g/dL — ABNORMAL HIGH (ref 30.0–36.0)
MCV: 96.2 fL (ref 80.0–100.0)
Monocytes Absolute: 1.2 K/uL — ABNORMAL HIGH (ref 0.1–1.0)
Monocytes Relative: 10 %
Neutro Abs: 7.2 K/uL (ref 1.7–7.7)
Neutrophils Relative %: 61 %
Platelets: 286 K/uL (ref 150–400)
RBC: 2.65 MIL/uL — ABNORMAL LOW (ref 4.22–5.81)
RDW: 22.3 % — ABNORMAL HIGH (ref 11.5–15.5)
WBC: 11.8 K/uL — ABNORMAL HIGH (ref 4.0–10.5)
nRBC: 1.2 % — ABNORMAL HIGH (ref 0.0–0.2)

## 2024-03-01 LAB — RETICULOCYTES
Immature Retic Fract: 40.1 % — ABNORMAL HIGH (ref 2.3–15.9)
RBC.: 2.55 MIL/uL — ABNORMAL LOW (ref 4.22–5.81)
Retic Count, Absolute: 450.5 K/uL — ABNORMAL HIGH (ref 19.0–186.0)
Retic Ct Pct: 17.7 % — ABNORMAL HIGH (ref 0.4–3.1)

## 2024-03-01 LAB — COMPREHENSIVE METABOLIC PANEL WITH GFR
ALT: 32 U/L (ref 0–44)
AST: 65 U/L — ABNORMAL HIGH (ref 15–41)
Albumin: 3.9 g/dL (ref 3.5–5.0)
Alkaline Phosphatase: 69 U/L (ref 38–126)
Anion gap: 12 (ref 5–15)
BUN: 7 mg/dL (ref 6–20)
CO2: 21 mmol/L — ABNORMAL LOW (ref 22–32)
Calcium: 8.8 mg/dL — ABNORMAL LOW (ref 8.9–10.3)
Chloride: 105 mmol/L (ref 98–111)
Creatinine, Ser: 0.37 mg/dL — ABNORMAL LOW (ref 0.61–1.24)
GFR, Estimated: >60 mL/min (ref >60)
Glucose, Bld: 122 mg/dL — ABNORMAL HIGH (ref 70–99)
Potassium: 4 mmol/L (ref 3.5–5.1)
Sodium: 138 mmol/L (ref 135–145)
Total Bilirubin: 7.6 mg/dL — ABNORMAL HIGH (ref 0.0–1.2)
Total Protein: 7.6 g/dL (ref 6.5–8.1)

## 2024-03-01 MED ORDER — SODIUM CHLORIDE 0.9 % IV BOLUS
1000.0000 mL | Freq: Once | INTRAVENOUS | Status: AC
  Administered 2024-03-01: 1000 mL via INTRAVENOUS

## 2024-03-01 MED ORDER — HYDROMORPHONE HCL 1 MG/ML IJ SOLN
2.0000 mg | INTRAMUSCULAR | Status: AC | PRN
  Administered 2024-03-01 (×2): 2 mg via INTRAVENOUS
  Filled 2024-03-01 (×2): qty 2

## 2024-03-01 NOTE — ED Notes (Signed)
 Assumed care of pt, found him asleep in the room w/ visitor bedside.  Pt easily woken.  Pt reports he just got some medication.  RN will walk pt in 15 minutes.  No complaints or needs identified at this time.

## 2024-03-01 NOTE — ED Notes (Signed)
 RN just walked pt, O2 dropped to 86% on RA w/ a good pluth.  Pt had to be put back on 3L  to return to 94%.  Provider notified.

## 2024-03-01 NOTE — ED Triage Notes (Signed)
 Sickle cell crisis.  Pt states the pain in is right leg.  Last crisis was a couple of months ago.

## 2024-03-01 NOTE — ED Provider Notes (Signed)
 [place] EM[facility] DEPARTMENT AT [facility] Provider Note   CSN: [id number] Arrival date & time: 03/01/24  1956     Patient presents with: Sickle Cell Pain Crisis   Michael Mullins is a 43 y.o. male.  {Add pertinent medical, surgical, social history, OB history to YEP:67052}  Sickle Cell Pain Crisis  This patient is a 43 year old male with a history of sickle cell disease, he is followed in the sickle cell clinic at [facility], he is on Oxy codon as needed, 30 mg tablets and took 1 dose this morning as the last couple of days he has had some increasing pain in his right leg.  The pain goes from his right hip down to his knee, there is no swelling or rashes noted, he has not felt like there is been any changes in color or temperature of the leg and he does not have any fevers or chills nor does he have any chest pain or shortness of breath.  He is last blood transfusion was in June 2025, he follows closely with the clinic and was just seen there approximately 5 or 6 days ago.  He reports that this is fairly consistent with a history of sickle cell flareups.    Prior to Admission medications   Medication Sig Start Date End Date Taking? Authorizing Provider  adalimumab  (HUMIRA , 2 PEN,) 40 MG/0.8ML AJKT pen Inject 0.8 ml (40 mg total) under the skin every 14 days 02/27/24   Jegede, Olugbemiga E, MD  clindamycin  (CLEOCIN  T) 1 % external solution Apply to face and affected areas of the scalp in the morning 01/23/24     Continuous Glucose Sensor (FREESTYLE LIBRE 3 PLUS SENSOR) MISC Apply one sensor to the back of the arm every 15 days for continuous glucose monitoring. Follow package directions for replacement. 11/21/23     empagliflozin  (JARDIANCE ) 10 MG TABS tablet Take 1 tablet (10 mg total) by mouth daily. 11/21/23     folic acid  (FOLVITE ) 1 MG tablet Take 1 tablet (1 mg total) by mouth daily. 08/29/22   Tilford Bertram HERO, FNP  folic acid  (FOLVITE ) 1 MG tablet Take 1 tablet  (1 mg total) by mouth daily. 12/25/23     gabapentin  (NEURONTIN ) 300 MG capsule Take 1 capsule (300 mg total) by mouth at bedtime. 11/26/23   Waddle, Sarah Elizabeth, PA-C  glucose blood (PRECISION QID TEST) test strip Use 1 strip to check blood sugar daily. 03/22/22     ibuprofen  (ADVIL ) 800 MG tablet Take 1 tablet (800 mg total) by mouth every 8 (eight) hours as needed for fever 100.4 F or GREATER (pain). 11/07/23     Lancets (FREESTYLE) lancets Use 1 lancet to lance skin to check blood sugar once daily. 03/22/22     lidocaine (XYLOCAINE) 2 % jelly Apply 5 mLs topically. 11/27/23   [provider]  lisinopril  (ZESTRIL ) 2.5 MG tablet Take 1 tablet (2.5 mg total) by mouth daily. 12/30/22     lisinopril  (ZESTRIL ) 2.5 MG tablet Take 1 tablet (2.5 mg total) by mouth daily. 12/25/23     naloxone  (NARCAN ) nasal spray 4 mg/0.1 mL Place 1 spray into one nostril as needed. (for excessive sedation) 01/15/23     ondansetron  (ZOFRAN -ODT) 4 MG disintegrating tablet Take 1 tablet (4 mg total) by mouth every 8 (eight) hours as needed for nausea or vomiting. 01/13/23   Jerrol Agent, MD  oxyCODONE  (ROXICODONE ) 15 MG immediate release tablet Take 1-2 tablet (15-30 mg total) by  mouth every 8 (eight) hours as needed for break through pain based on pain levels. 02/10/24     oxyCODONE  ER (XTAMPZA  ER) 27 MG C12A Take 1 capsule by mouth 2 (two) times daily. 11/10/23     oxyCODONE  ER (XTAMPZA  ER) 27 MG C12A Take 1 capsule (27 mg total) by mouth 2 (two) times daily. 12/08/23     oxyCODONE  ER (XTAMPZA  ER) 27 MG C12A Take 1 capsule (27 mg) by mouth 2 (two) times daily. 01/13/24     oxyCODONE  ER (XTAMPZA  ER) 27 MG C12A Take 1 capsule by mouth 2 (two) times daily. 02/10/24     rosuvastatin  (CRESTOR ) 5 MG tablet Take 1 tablet (5 mg total) by mouth daily for cholesterol Patient not taking: Reported on 12/02/2023 10/01/22     tretinoin  (RETIN-A ) 0.025 % cream Apply a pea sized amount (0.25g) to the whole face at night time. Start 2-3 times  a week, then slowly increase to every night. Can apply moisturizer over top. 01/23/24     Vitamin D , Ergocalciferol , (DRISDOL ) 1.25 MG (50000 UNIT) CAPS capsule Take 1 capsule (50,000 Units total) by mouth every 30 (thirty) days. 09/09/22       Allergies: Morphine and Vancomycin     Review of Systems  All other systems reviewed and are negative.   Updated Vital Signs BP 129/73 (BP Location: Left Arm)   Pulse 95   Temp 98.5 F (36.9 C) (Oral)   Resp 18   Ht 1.778 m (5' 10)   Wt 104.3 kg   SpO2 92%   BMI 32.99 kg/m   Physical Exam Vitals and nursing note reviewed.  Constitutional:      General: He is not in acute distress.    Appearance: He is well-developed.  HENT:     Head: Normocephalic and atraumatic.     Mouth/Throat:     Pharynx: No oropharyngeal exudate.  Eyes:     General: No scleral icterus.       Right eye: No discharge.        Left eye: No discharge.     Conjunctiva/sclera: Conjunctivae normal.     Pupils: Pupils are equal, round, and reactive to light.  Neck:     Thyroid: No thyromegaly.     Vascular: No JVD.  Cardiovascular:     Rate and Rhythm: Normal rate and regular rhythm.     Heart sounds: Normal heart sounds. No murmur heard.    No friction rub. No gallop.  Pulmonary:     Effort: Pulmonary effort is normal. No respiratory distress.     Breath sounds: Normal breath sounds. No wheezing or rales.  Abdominal:     General: Bowel sounds are normal. There is no distension.     Palpations: Abdomen is soft. There is no mass.     Tenderness: There is no abdominal tenderness.  Musculoskeletal:        General: No tenderness. Normal range of motion.     Cervical back: Normal range of motion and neck supple.     Right lower leg: No edema.     Left lower leg: No edema.     Comments: The bilateral lower extremities are symmetrical, there is no edema, they are both warm and perfused with normal pulses at both the dorsalis pedis and the posterior tibialis.  He has  good range of motion of the ankles knees and hips but has mild pain with range of motion of the right lower extremity at the major joints,  there is no tenderness with palpation over the compartments and there is no rash or subcutaneous emphysema.  Lymphadenopathy:     Cervical: No cervical adenopathy.  Skin:    General: Skin is warm and dry.     Findings: No erythema or rash.  Neurological:     Mental Status: He is alert.     Coordination: Coordination normal.  Psychiatric:        Behavior: Behavior normal.     (all labs ordered are listed, but only abnormal results are displayed) Labs Reviewed - No data to display  EKG: None  Radiology: No results found.  {Document cardiac monitor, telemetry assessment procedure when appropriate:32947} Procedures   Medications Ordered in the ED  HYDROmorphone  (DILAUDID ) injection 2 mg (has no administration in time range)  sodium chloride  0.9 % bolus 1,000 mL (has no administration in time range)      {Click here for ABCD2, HEART and other calculators REFRESH Note before signing:1}                              Medical Decision Making Amount and/or Complexity of Data Reviewed Labs: ordered.  Risk Prescription drug management.    This patient presents to the ED for concern of pain to the right lower extremity consistent with sickle cell crisis, it is atraumatic, there is no fevers differential diagnosis includes sickle cell crisis, seems much less likely to be DVT, the patient has normal-appearing legs and this is a recurring pain in that leg for him    Additional history obtained   Additional history obtained from Electronic Medical Record External records from outside source obtained and reviewed including prior notes from [facility] sickle cell clinic and hematology   Lab Tests:  I Ordered, and personally interpreted labs.  The pertinent results include:  ***   Imaging Studies ordered:  I ordered imaging  studies including ***  I independently visualized and interpreted imaging which showed *** I agree with the radiologist interpretation   Medicines ordered and prescription drug management:  I ordered medication including ***    I have reviewed the patients home medicines and have made adjustments as needed   Problem List / ED Course:  ***   Social Determinants of Health:       {Document critical care time when appropriate  Document review of labs and clinical decision tools ie CHADS2VASC2, etc  Document your independent review of radiology images and any outside records  Document your discussion with family members, caretakers and with consultants  Document social determinants of health affecting pt's care  Document your decision making why or why not admission, treatments were needed:32947:::1}   Final diagnoses:  None    ED Discharge Orders     None

## 2024-03-02 NOTE — ED Notes (Signed)
 RN rewalked pt, his O2 remained above 96% the entire walk.  Pt indicated that he felt much better.  Provider made aware.

## 2024-03-02 NOTE — ED Provider Notes (Signed)
 Signed out to me by Dr. Cleotilde.  Patient treated for acute sickle cell painful crisis in the right leg.  Patient achieved analgesia with medications but oxygen  saturations dropped, requiring some supplemental oxygen .  He has now been weaned off of the oxygen , ambulatory without difficulty.  He reports that he still has good pain control and would like to be discharged.   Haze Lonni PARAS, MD 03/02/24 [phone]

## 2024-03-10 ENCOUNTER — Other Ambulatory Visit: Payer: Self-pay

## 2024-03-12 ENCOUNTER — Other Ambulatory Visit (HOSPITAL_COMMUNITY): Payer: Self-pay

## 2024-03-12 MED ORDER — OXYCODONE HCL 15 MG PO TABS
15.0000 mg | ORAL_TABLET | Freq: Three times a day (TID) | ORAL | 0 refills | Status: DC | PRN
  Filled 2024-03-12: qty 150, 30d supply, fill #0

## 2024-03-12 MED ORDER — XTAMPZA ER 27 MG PO C12A
27.0000 mg | EXTENDED_RELEASE_CAPSULE | Freq: Two times a day (BID) | ORAL | 0 refills | Status: AC
  Filled 2024-03-12: qty 60, 30d supply, fill #0

## 2024-03-18 ENCOUNTER — Other Ambulatory Visit: Payer: Self-pay

## 2024-03-18 NOTE — Progress Notes (Signed)
 Specialty Pharmacy Ongoing Clinical Assessment Note  Michael Mullins is a 43 y.o. male who is being followed by the specialty pharmacy service for RxSp Hidradenitis Suppurativa   Patient's specialty medication(s) reviewed today: Adalimumab  (Humira  (2 Pen))   Missed doses in the last 4 weeks: 0   Patient/Caregiver did not have any additional questions or concerns.   Therapeutic benefit summary: Patient is NOT achieving benefit (Patient recently restarted treatment after some missed doses.)   Adverse events/side effects summary: No adverse events/side effects   Patient's therapy is appropriate to: Continue    Goals Addressed             This Visit's Progress    Maintain optimal adherence to therapy   On track    Patient is on track. Patient will maintain adherence         Follow up: 12 months  Bethzy Hauck M Sihaam Chrobak Specialty Pharmacist

## 2024-03-18 NOTE — Progress Notes (Signed)
 Specialty Pharmacy Refill Coordination Note  Michael Mullins is a 43 y.o. male contacted today regarding refills of specialty medication(s) Adalimumab  (Humira  (2 Pen))   Patient requested Delivery   Delivery date: 03/26/24   Verified address: [address]   [place] [address]   Medication will be filled on 03/25/24.

## 2024-03-25 ENCOUNTER — Other Ambulatory Visit (HOSPITAL_COMMUNITY): Payer: Self-pay

## 2024-03-25 ENCOUNTER — Other Ambulatory Visit: Payer: Self-pay

## 2024-04-13 ENCOUNTER — Other Ambulatory Visit (HOSPITAL_COMMUNITY): Payer: Self-pay

## 2024-04-13 MED ORDER — XTAMPZA ER 27 MG PO C12A
27.0000 mg | EXTENDED_RELEASE_CAPSULE | Freq: Two times a day (BID) | ORAL | 0 refills | Status: AC
  Filled 2024-04-13: qty 60, 30d supply, fill #0

## 2024-04-13 MED ORDER — OXYCODONE HCL 15 MG PO TABS
15.0000 mg | ORAL_TABLET | Freq: Three times a day (TID) | ORAL | 0 refills | Status: DC | PRN
  Filled 2024-04-13: qty 150, 30d supply, fill #0

## 2024-04-15 ENCOUNTER — Other Ambulatory Visit (HOSPITAL_BASED_OUTPATIENT_CLINIC_OR_DEPARTMENT_OTHER): Payer: Self-pay

## 2024-04-15 ENCOUNTER — Other Ambulatory Visit: Payer: Self-pay

## 2024-04-15 ENCOUNTER — Other Ambulatory Visit (HOSPITAL_COMMUNITY): Payer: Self-pay

## 2024-04-15 MED ORDER — VITAMIN D (ERGOCALCIFEROL) 1.25 MG (50000 UNIT) PO CAPS
50000.0000 [IU] | ORAL_CAPSULE | ORAL | 0 refills | Status: AC
  Filled 2024-04-15: qty 3, 90d supply, fill #0

## 2024-04-19 ENCOUNTER — Other Ambulatory Visit: Payer: Self-pay

## 2024-04-19 ENCOUNTER — Other Ambulatory Visit: Payer: Self-pay | Admitting: Pharmacy Technician

## 2024-04-19 NOTE — Progress Notes (Signed)
 Specialty Pharmacy Refill Coordination Note  Michael Mullins is a 43 y.o. male contacted today regarding refills of specialty medication(s) Adalimumab  (Humira  (2 Pen))   Patient requested Delivery   Delivery date: 04/22/24   Verified address: [address] [place]    Medication will be filled on: 04/21/24

## 2024-04-20 ENCOUNTER — Other Ambulatory Visit: Payer: Self-pay

## 2024-04-23 ENCOUNTER — Other Ambulatory Visit (HOSPITAL_COMMUNITY): Payer: Self-pay

## 2024-04-23 DIAGNOSIS — L663 Perifolliculitis capitis abscedens: Secondary | ICD-10-CM | POA: Diagnosis not present

## 2024-04-23 DIAGNOSIS — L700 Acne vulgaris: Secondary | ICD-10-CM | POA: Diagnosis not present

## 2024-04-23 MED ORDER — DOXYCYCLINE HYCLATE 100 MG PO TABS
100.0000 mg | ORAL_TABLET | Freq: Two times a day (BID) | ORAL | 0 refills | Status: AC
  Filled 2024-04-23: qty 28, 14d supply, fill #0

## 2024-04-27 ENCOUNTER — Other Ambulatory Visit: Payer: Self-pay

## 2024-05-12 ENCOUNTER — Other Ambulatory Visit (HOSPITAL_COMMUNITY): Payer: Self-pay

## 2024-05-12 MED ORDER — OXYCODONE HCL 15 MG PO TABS
15.0000 mg | ORAL_TABLET | Freq: Three times a day (TID) | ORAL | 0 refills | Status: DC | PRN
  Filled 2024-05-12: qty 150, 25d supply, fill #0
  Filled 2024-05-13: qty 150, 30d supply, fill #0

## 2024-05-12 MED ORDER — XTAMPZA ER 27 MG PO C12A
1.0000 | EXTENDED_RELEASE_CAPSULE | Freq: Two times a day (BID) | ORAL | 0 refills | Status: AC
  Filled 2024-05-12 – 2024-05-13 (×2): qty 60, 30d supply, fill #0

## 2024-05-13 ENCOUNTER — Other Ambulatory Visit (HOSPITAL_COMMUNITY): Payer: Self-pay

## 2024-05-17 ENCOUNTER — Other Ambulatory Visit: Payer: Self-pay

## 2024-05-18 ENCOUNTER — Other Ambulatory Visit (HOSPITAL_COMMUNITY): Payer: Self-pay

## 2024-05-19 ENCOUNTER — Other Ambulatory Visit: Payer: Self-pay

## 2024-05-19 ENCOUNTER — Other Ambulatory Visit: Payer: Self-pay | Admitting: Pharmacy Technician

## 2024-05-19 NOTE — Progress Notes (Signed)
 Specialty Pharmacy Refill Coordination Note  Michael Mullins is a 43 y.o. male contacted today regarding refills of specialty medication(s) Adalimumab  (Humira  (2 Pen))   Patient requested Delivery   Delivery date: 05/21/24   Verified address: [address]  [place]   Medication will be filled on: 05/20/24

## 2024-05-20 ENCOUNTER — Other Ambulatory Visit (HOSPITAL_COMMUNITY): Payer: Self-pay

## 2024-05-20 ENCOUNTER — Other Ambulatory Visit: Payer: Self-pay

## 2024-05-31 DIAGNOSIS — D5700 Hb-SS disease with crisis, unspecified: Secondary | ICD-10-CM | POA: Diagnosis not present

## 2024-05-31 DIAGNOSIS — D571 Sickle-cell disease without crisis: Secondary | ICD-10-CM | POA: Diagnosis not present

## 2024-06-08 ENCOUNTER — Other Ambulatory Visit: Payer: Self-pay

## 2024-06-09 ENCOUNTER — Other Ambulatory Visit: Payer: Self-pay

## 2024-06-11 ENCOUNTER — Other Ambulatory Visit: Payer: Self-pay

## 2024-06-11 NOTE — Progress Notes (Signed)
 Specialty Pharmacy Refill Coordination Note  Michael Mullins is a 44 y.o. male contacted today regarding refills of specialty medication(s) Adalimumab  (Humira  (2 Pen))   Patient requested Delivery   Delivery date: 06/15/24   Verified address: [address]  [place]   Medication will be filled on: 06/14/24

## 2024-06-14 ENCOUNTER — Other Ambulatory Visit: Payer: Self-pay

## 2024-06-15 ENCOUNTER — Other Ambulatory Visit (HOSPITAL_COMMUNITY): Payer: Self-pay

## 2024-06-15 MED ORDER — XTAMPZA ER 27 MG PO C12A
27.0000 mg | EXTENDED_RELEASE_CAPSULE | Freq: Two times a day (BID) | ORAL | 0 refills | Status: AC
  Filled 2024-06-15 – 2024-06-17 (×2): qty 60, 30d supply, fill #0

## 2024-06-15 MED ORDER — OXYCODONE HCL 15 MG PO TABS
15.0000 mg | ORAL_TABLET | Freq: Three times a day (TID) | ORAL | 0 refills | Status: AC | PRN
  Filled 2024-06-15 – 2024-06-17 (×2): qty 150, 25d supply, fill #0

## 2024-06-16 ENCOUNTER — Other Ambulatory Visit (HOSPITAL_COMMUNITY): Payer: Self-pay

## 2024-06-17 ENCOUNTER — Other Ambulatory Visit (HOSPITAL_COMMUNITY): Payer: Self-pay

## 2024-06-18 ENCOUNTER — Other Ambulatory Visit (HOSPITAL_COMMUNITY): Payer: Self-pay

## 2024-06-18 ENCOUNTER — Other Ambulatory Visit: Payer: Self-pay

## 2024-07-08 ENCOUNTER — Other Ambulatory Visit (HOSPITAL_COMMUNITY): Payer: Self-pay

## 2024-07-14 ENCOUNTER — Other Ambulatory Visit (HOSPITAL_COMMUNITY): Payer: Self-pay

## 2024-07-16 ENCOUNTER — Other Ambulatory Visit: Payer: Self-pay | Admitting: Pharmacy Technician

## 2024-07-16 ENCOUNTER — Other Ambulatory Visit (HOSPITAL_COMMUNITY): Payer: Self-pay

## 2024-07-16 ENCOUNTER — Other Ambulatory Visit: Payer: Self-pay

## 2024-07-16 NOTE — Progress Notes (Signed)
 Specialty Pharmacy Refill Coordination Note  Michael Mullins is a 44 y.o. male contacted today regarding refills of specialty medication(s) Adalimumab  (Humira  (2 Pen))   Patient requested Delivery   Delivery date: 07/20/24   Verified address: [address]  [place]   Medication will be filled on: 07/19/24
